# Patient Record
Sex: Male | Born: 1945 | Race: White | Hispanic: No | Marital: Married | Smoking: Former smoker
Health system: Southern US, Community
[De-identification: ages and names within clinical notes are randomized; demographics above are authoritative.]

## PROBLEM LIST (undated history)

## (undated) DIAGNOSIS — M1990 Unspecified osteoarthritis, unspecified site: Secondary | ICD-10-CM

## (undated) DIAGNOSIS — L570 Actinic keratosis: Secondary | ICD-10-CM

## (undated) DIAGNOSIS — F32A Depression, unspecified: Secondary | ICD-10-CM

## (undated) DIAGNOSIS — I2510 Atherosclerotic heart disease of native coronary artery without angina pectoris: Secondary | ICD-10-CM

## (undated) DIAGNOSIS — G4730 Sleep apnea, unspecified: Secondary | ICD-10-CM

## (undated) DIAGNOSIS — M5136 Other intervertebral disc degeneration, lumbar region: Secondary | ICD-10-CM

## (undated) DIAGNOSIS — G4761 Periodic limb movement disorder: Secondary | ICD-10-CM

## (undated) DIAGNOSIS — F329 Major depressive disorder, single episode, unspecified: Secondary | ICD-10-CM

## (undated) DIAGNOSIS — M51369 Other intervertebral disc degeneration, lumbar region without mention of lumbar back pain or lower extremity pain: Secondary | ICD-10-CM

## (undated) DIAGNOSIS — I639 Cerebral infarction, unspecified: Secondary | ICD-10-CM

## (undated) DIAGNOSIS — Z951 Presence of aortocoronary bypass graft: Secondary | ICD-10-CM

## (undated) DIAGNOSIS — K219 Gastro-esophageal reflux disease without esophagitis: Secondary | ICD-10-CM

## (undated) DIAGNOSIS — E119 Type 2 diabetes mellitus without complications: Secondary | ICD-10-CM

## (undated) DIAGNOSIS — E079 Disorder of thyroid, unspecified: Secondary | ICD-10-CM

## (undated) DIAGNOSIS — M109 Gout, unspecified: Secondary | ICD-10-CM

## (undated) DIAGNOSIS — I10 Essential (primary) hypertension: Secondary | ICD-10-CM

## (undated) DIAGNOSIS — E7800 Pure hypercholesterolemia, unspecified: Secondary | ICD-10-CM

## (undated) HISTORY — PX: SHOULDER SURGERY: SHX246

## (undated) HISTORY — PX: CORONARY ARTERY BYPASS GRAFT: SHX141

## (undated) HISTORY — DX: Actinic keratosis: L57.0

## (undated) HISTORY — PX: CARPAL TUNNEL RELEASE: SHX101

## (undated) HISTORY — PX: EYE SURGERY: SHX253

## (undated) HISTORY — PX: BACK SURGERY: SHX140

---

## 2007-06-10 DIAGNOSIS — J309 Allergic rhinitis, unspecified: Secondary | ICD-10-CM | POA: Insufficient documentation

## 2011-05-11 DIAGNOSIS — Z951 Presence of aortocoronary bypass graft: Secondary | ICD-10-CM | POA: Insufficient documentation

## 2011-05-11 DIAGNOSIS — E8881 Metabolic syndrome: Secondary | ICD-10-CM | POA: Insufficient documentation

## 2013-08-28 DIAGNOSIS — H269 Unspecified cataract: Secondary | ICD-10-CM | POA: Insufficient documentation

## 2014-06-01 DIAGNOSIS — E1121 Type 2 diabetes mellitus with diabetic nephropathy: Secondary | ICD-10-CM | POA: Insufficient documentation

## 2015-12-07 ENCOUNTER — Encounter: Payer: Self-pay | Admitting: Emergency Medicine

## 2015-12-07 ENCOUNTER — Emergency Department
Admission: EM | Admit: 2015-12-07 | Discharge: 2015-12-07 | Disposition: A | Discharge status: Home / Self Care | Payer: Medicare Other | Attending: Emergency Medicine | Admitting: Emergency Medicine

## 2015-12-07 DIAGNOSIS — M5431 Sciatica, right side: Secondary | ICD-10-CM | POA: Diagnosis not present

## 2015-12-07 DIAGNOSIS — Y9301 Activity, walking, marching and hiking: Secondary | ICD-10-CM | POA: Insufficient documentation

## 2015-12-07 DIAGNOSIS — S39012A Strain of muscle, fascia and tendon of lower back, initial encounter: Secondary | ICD-10-CM | POA: Diagnosis not present

## 2015-12-07 DIAGNOSIS — Y92007 Garden or yard of unspecified non-institutional (private) residence as the place of occurrence of the external cause: Secondary | ICD-10-CM | POA: Diagnosis not present

## 2015-12-07 DIAGNOSIS — I10 Essential (primary) hypertension: Secondary | ICD-10-CM | POA: Diagnosis not present

## 2015-12-07 DIAGNOSIS — Z951 Presence of aortocoronary bypass graft: Secondary | ICD-10-CM | POA: Diagnosis not present

## 2015-12-07 DIAGNOSIS — X58XXXA Exposure to other specified factors, initial encounter: Secondary | ICD-10-CM | POA: Insufficient documentation

## 2015-12-07 DIAGNOSIS — E119 Type 2 diabetes mellitus without complications: Secondary | ICD-10-CM | POA: Insufficient documentation

## 2015-12-07 DIAGNOSIS — Z87891 Personal history of nicotine dependence: Secondary | ICD-10-CM | POA: Diagnosis not present

## 2015-12-07 DIAGNOSIS — Y999 Unspecified external cause status: Secondary | ICD-10-CM | POA: Insufficient documentation

## 2015-12-07 DIAGNOSIS — S3992XA Unspecified injury of lower back, initial encounter: Secondary | ICD-10-CM | POA: Diagnosis present

## 2015-12-07 HISTORY — DX: Type 2 diabetes mellitus without complications: E11.9

## 2015-12-07 HISTORY — DX: Disorder of thyroid, unspecified: E07.9

## 2015-12-07 HISTORY — DX: Pure hypercholesterolemia, unspecified: E78.00

## 2015-12-07 HISTORY — DX: Unspecified osteoarthritis, unspecified site: M19.90

## 2015-12-07 HISTORY — DX: Essential (primary) hypertension: I10

## 2015-12-07 MED ORDER — OXYCODONE-ACETAMINOPHEN 5-325 MG PO TABS: 1.0000 | ORAL_TABLET | Freq: Four times a day (QID) | ORAL | 0 refills | Status: AC | PRN

## 2015-12-07 MED ORDER — KETOROLAC TROMETHAMINE 30 MG/ML IJ SOLN
30.0000 mg | Freq: Once | INTRAMUSCULAR | Status: AC
  Administered 2015-12-07: 30 mg via INTRAMUSCULAR
  Filled 2015-12-07: qty 1

## 2015-12-07 MED ORDER — DIAZEPAM 5 MG PO TABS
5.0000 mg | ORAL_TABLET | Freq: Once | ORAL | Status: AC
  Administered 2015-12-07: 5 mg via ORAL
  Filled 2015-12-07: qty 1

## 2015-12-07 MED ORDER — DIAZEPAM 2 MG PO TABS: 2.0000 mg | ORAL_TABLET | Freq: Three times a day (TID) | ORAL | 0 refills | Status: AC | PRN

## 2015-12-07 MED ORDER — DIAZEPAM 5 MG/ML IJ SOLN
5.0000 mg | Freq: Once | INTRAMUSCULAR | Status: DC
Start: 2015-12-07 — End: 2015-12-07

## 2015-12-07 MED ORDER — OXYCODONE-ACETAMINOPHEN 5-325 MG PO TABS
1.0000 | ORAL_TABLET | Freq: Once | ORAL | Status: AC
  Administered 2015-12-07: 1 via ORAL
  Filled 2015-12-07: qty 1

## 2015-12-07 NOTE — ED Notes (Signed)
Pt states back pain began Saturday after doing a lot of yard work like blowing leaves. Hx of L4 and L5 surgery, last in February. Pt states pain worse with movement, hasn't been able to sleep. Pt has taken hydrocodone and motrin without relief. Pt wheeled to room, c/o R knee has buckled a few times d/t pain shooting down R leg to R knee.

## 2015-12-07 NOTE — ED Triage Notes (Signed)
Patient reports lower back pain that radiates down right leg. Patient has h/o surgery to d/t ruptured disc. Denies any fall/injury.

## 2015-12-07 NOTE — ED Provider Notes (Signed)
[facility] Emergency Department Provider Note  ____________________________________________  Time seen: Approximately 12:11 PM  I have reviewed the triage vital signs and the nursing notes.   HISTORY  Chief Complaint Back Pain    HPI Tim Miranda is a 70 y.o. male , NAD, presents to emergency department with four-day history of right lower back pain. Patient states he was working in his yard on Saturday and had onset of lower back pain. States over the course of the afternoon pain seemed to worsen especially when he would walk downstairs. Had an episode in which his right knee "gave out" but did not call his fall. Later in the evening he and his wife went out to dinner and states while walking in the restaurant his knee "gave out" again. But again denies any fall. States his lower back pain has begun to radiate down the leg into the knee over the last 2 days. Has a prescription for hydrocodone which was given him after his lumbar surgery in February 2017. Has run out of that medication. Denies any saddle paresthesias or loss of bowel or bladder control. Has not noted any rashes about skin. Denies any chest pain or shortness of breath. Has been able to ambulate but with increased pain with weightbearing about the right leg. Has had issues with lower back pain and sciatica in the past which has been similar. Has recently moved to the area over the last 4 weeks and has not been able to establish care with orthopedics or primary care.   Past Medical History:  Diagnosis Date  . Arthritis   . Diabetes mellitus without complication ([place])   . Hypercholesteremia   . Hypertension   . Thyroid disease     There are no active problems to display for this patient.   Past Surgical History:  Procedure Laterality Date  . BACK SURGERY    . CARPAL TUNNEL RELEASE    . CORONARY ARTERY BYPASS GRAFT    . EYE SURGERY    . SHOULDER SURGERY      Prior to Admission medications    Medication Sig Start Date End Date Taking? Authorizing Provider  diazepam (VALIUM) 2 MG tablet Take 1 tablet (2 mg total) by mouth every 8 (eight) hours as needed for muscle spasms. 12/07/15 12/14/15  Brigett Estell L Aeris Hersman, PA-C  oxyCODONE-acetaminophen (ROXICET) 5-325 MG tablet Take 1 tablet by mouth every 6 (six) hours as needed. 12/07/15 12/06/16  Dallys Nowakowski L Myley Bahner, PA-C    Allergies Review of patient's allergies indicates no known allergies.  History reviewed. No pertinent family history.  Social History Social History  Substance Use Topics  . Smoking status: Former Research scientist (life sciences)  . Smokeless tobacco: Not on file  . Alcohol use No     Review of Systems  Constitutional: No fatigue Cardiovascular: No chest pain. Respiratory:  No shortness of breath.  Musculoskeletal: Positive for back pain.  Skin: Negative for rash. Neurological: Negative for numbness, weakness, tingling. No saddle paresthesias nor loss of bowel or bladder control. 10-point ROS otherwise negative.  ____________________________________________   PHYSICAL EXAM:  VITAL SIGNS: ED Triage Vitals  Enc Vitals Group     BP 12/07/15 1126 (!) 154/72     Pulse Rate 12/07/15 1126 75     Resp 12/07/15 1126 18     Temp 12/07/15 1126 97.4 F (36.3 C)     Temp Source 12/07/15 1126 Oral     SpO2 12/07/15 1126 95 %     Weight 12/07/15  1123 233 lb (105.7 kg)     Height 12/07/15 1123 5\' 9"  (1.753 m)     Head Circumference --      Peak Flow --      Pain Score 12/07/15 1123 10     Pain Loc --      Pain Edu? --      Excl. in [place]? --      Constitutional: Alert and oriented. Well appearing and in no acute distress. Eyes: Conjunctivae are normal.  Head: Atraumatic. Cardiovascular: Normal rate, regular rhythm. Normal S1 and S2.  Good peripheral circulation with 2+ pulses noted in the right lower extremity. Respiratory: Normal respiratory effort without tachypnea or retractions. Lungs CTAB with breath sounds noted in all lung  fields. Musculoskeletal: Mild lower central lumbar tenderness to palpation without step-offs or masses. Tenderness to palpation about the right lateral musculoskeletal region of the lumbar spine. Positive right SI joint tenderness to deep palpation. No hip or pelvic pain to palpation. Positive right straight leg raise. Negative left straight leg raise. No lower extremity tenderness nor edema.  No joint effusions. Neurologic:  Normal speech and language. No gross focal neurologic deficits are appreciated.  Skin:  Skin is warm, dry and intact. No rash, redness, swelling, skin sores noted. Psychiatric: Mood and affect are normal. Speech and behavior are normal. Patient exhibits appropriate insight and judgement.   ____________________________________________   LABS  None ____________________________________________  EKG  None ____________________________________________  RADIOLOGY  None ____________________________________________    PROCEDURES  Procedure(s) performed: None   Procedures   Medications  oxyCODONE-acetaminophen (PERCOCET/ROXICET) 5-325 MG per tablet 1 tablet (1 tablet Oral Given 12/07/15 1230)  ketorolac (TORADOL) 30 MG/ML injection 30 mg (30 mg Intramuscular Given 12/07/15 1230)  diazepam (VALIUM) tablet 5 mg (5 mg Oral Given 12/07/15 1230)   Patient noted significant decrease in pain after being given Percocet, Toradol and Valium.  ____________________________________________   INITIAL IMPRESSION / ASSESSMENT AND PLAN / ED COURSE  Pertinent labs & imaging results that were available during my care of the patient were reviewed by me and considered in my medical decision making (see chart for details).  Clinical Course  Comment By Time  Patient notes his pain has significantly improved with Toradol, Percocet and diazepam. Patient continues to deny any saddle paresthesias nor loss of bowel or bladder control. He has been able to ambulate with less pain. He is  agreeable for discharge and follow-up with orthopedics. Braxton Feathers, PA-C 09/27 1328    Patient's diagnosis is consistent with sciatica of the right side due to low back strain. Patient will be discharged home with prescriptions for Roxicet and Valium to take as directed. Patient is to follow up with Dr. Roland Rack in orthopedics if symptoms persist past this treatment course. Patient also advised to establish care with [facility] for primary care services.Patient is given ED precautions to return to the ED for any worsening or new symptoms.    ____________________________________________  FINAL CLINICAL IMPRESSION(S) / ED DIAGNOSES  Final diagnoses:  Sciatica of right side  Low back strain, initial encounter      NEW MEDICATIONS STARTED DURING THIS VISIT:  Discharge Medication List as of 12/07/2015  1:31 PM    START taking these medications   Details  diazepam (VALIUM) 2 MG tablet Take 1 tablet (2 mg total) by mouth every 8 (eight) hours as needed for muscle spasms., Starting Wed 12/07/2015, Until Wed 12/14/2015, Print    oxyCODONE-acetaminophen (ROXICET) 5-325 MG tablet Take  1 tablet by mouth every 6 (six) hours as needed., Starting Wed 12/07/2015, Until Thu 12/06/2016, [place], PA-C 12/07/15 [place], MD 12/08/15 [phone]

## 2015-12-07 NOTE — ED Triage Notes (Signed)
Had back surgery feb 26. Reports back went out this weekend. Was blowing leaves all day Saturday and when was walking down stairs reports that is when it went out.

## 2015-12-20 ENCOUNTER — Other Ambulatory Visit: Payer: Self-pay | Admitting: Unknown Physician Specialty

## 2015-12-20 DIAGNOSIS — G8929 Other chronic pain: Secondary | ICD-10-CM

## 2015-12-20 DIAGNOSIS — M545 Low back pain, unspecified: Secondary | ICD-10-CM

## 2015-12-22 ENCOUNTER — Ambulatory Visit
Admission: RE | Admit: 2015-12-22 | Discharge: 2015-12-22 | Disposition: A | Discharge status: Home / Self Care | Payer: Medicare Other | Source: Ambulatory Visit | Attending: Unknown Physician Specialty | Admitting: Unknown Physician Specialty

## 2015-12-22 DIAGNOSIS — M48061 Spinal stenosis, lumbar region without neurogenic claudication: Secondary | ICD-10-CM | POA: Diagnosis not present

## 2015-12-22 DIAGNOSIS — M545 Low back pain, unspecified: Secondary | ICD-10-CM

## 2015-12-22 DIAGNOSIS — M5126 Other intervertebral disc displacement, lumbar region: Secondary | ICD-10-CM | POA: Insufficient documentation

## 2015-12-22 DIAGNOSIS — G8929 Other chronic pain: Secondary | ICD-10-CM

## 2015-12-22 DIAGNOSIS — M5440 Lumbago with sciatica, unspecified side: Secondary | ICD-10-CM | POA: Diagnosis not present

## 2015-12-22 MED ORDER — GADOBENATE DIMEGLUMINE 529 MG/ML IV SOLN
20.0000 mL | Freq: Once | INTRAVENOUS | Status: AC | PRN
  Administered 2015-12-22: 20 mL via INTRAVENOUS

## 2015-12-29 ENCOUNTER — Other Ambulatory Visit: Payer: Self-pay | Admitting: Neurological Surgery

## 2015-12-29 DIAGNOSIS — M5416 Radiculopathy, lumbar region: Secondary | ICD-10-CM

## 2016-01-02 ENCOUNTER — Ambulatory Visit
Admission: RE | Admit: 2016-01-02 | Discharge: 2016-01-02 | Disposition: A | Discharge status: Home / Self Care | Payer: Medicare Other | Source: Ambulatory Visit | Attending: Neurological Surgery | Admitting: Neurological Surgery

## 2016-01-02 DIAGNOSIS — M5116 Intervertebral disc disorders with radiculopathy, lumbar region: Secondary | ICD-10-CM | POA: Diagnosis not present

## 2016-01-02 DIAGNOSIS — M5416 Radiculopathy, lumbar region: Secondary | ICD-10-CM

## 2016-01-02 DIAGNOSIS — M48061 Spinal stenosis, lumbar region without neurogenic claudication: Secondary | ICD-10-CM | POA: Insufficient documentation

## 2016-02-06 DIAGNOSIS — K219 Gastro-esophageal reflux disease without esophagitis: Secondary | ICD-10-CM | POA: Insufficient documentation

## 2016-02-06 DIAGNOSIS — I2510 Atherosclerotic heart disease of native coronary artery without angina pectoris: Secondary | ICD-10-CM | POA: Insufficient documentation

## 2016-03-15 ENCOUNTER — Other Ambulatory Visit: Payer: Self-pay | Admitting: Nurse Practitioner

## 2016-03-15 ENCOUNTER — Ambulatory Visit
Admission: RE | Admit: 2016-03-15 | Discharge: 2016-03-15 | Disposition: A | Discharge status: Home / Self Care | Payer: Medicare Other | Source: Ambulatory Visit | Attending: Nurse Practitioner | Admitting: Nurse Practitioner

## 2016-03-15 DIAGNOSIS — R609 Edema, unspecified: Secondary | ICD-10-CM | POA: Diagnosis present

## 2016-07-08 ENCOUNTER — Encounter: Payer: Self-pay | Admitting: Emergency Medicine

## 2016-07-08 ENCOUNTER — Emergency Department: Payer: Medicare Other

## 2016-07-08 ENCOUNTER — Emergency Department
Admission: EM | Admit: 2016-07-08 | Discharge: 2016-07-08 | Disposition: A | Discharge status: Home / Self Care | Payer: Medicare Other | Attending: Emergency Medicine | Admitting: Emergency Medicine

## 2016-07-08 DIAGNOSIS — I10 Essential (primary) hypertension: Secondary | ICD-10-CM | POA: Insufficient documentation

## 2016-07-08 DIAGNOSIS — H5589 Other irregular eye movements: Secondary | ICD-10-CM | POA: Diagnosis not present

## 2016-07-08 DIAGNOSIS — Z87891 Personal history of nicotine dependence: Secondary | ICD-10-CM | POA: Diagnosis not present

## 2016-07-08 DIAGNOSIS — J40 Bronchitis, not specified as acute or chronic: Secondary | ICD-10-CM

## 2016-07-08 DIAGNOSIS — E119 Type 2 diabetes mellitus without complications: Secondary | ICD-10-CM | POA: Diagnosis not present

## 2016-07-08 DIAGNOSIS — R0602 Shortness of breath: Secondary | ICD-10-CM | POA: Diagnosis present

## 2016-07-08 LAB — CBC WITH DIFFERENTIAL/PLATELET
BASOS PCT: 1 %
Basophils Absolute: 0.1 10*3/uL (ref 0–0.1)
EOS ABS: 0.3 10*3/uL (ref 0–0.7)
EOS PCT: 3 %
HCT: 43.8 % (ref 40.0–52.0)
HEMOGLOBIN: 15.1 g/dL (ref 13.0–18.0)
Lymphocytes Relative: 27 %
Lymphs Abs: 2.4 10*3/uL (ref 1.0–3.6)
MCH: 31 pg (ref 26.0–34.0)
MCHC: 34.4 g/dL (ref 32.0–36.0)
MCV: 90.2 fL (ref 80.0–100.0)
MONO ABS: 1.3 10*3/uL — AB (ref 0.2–1.0)
MONOS PCT: 15 %
NEUTROS PCT: 54 %
Neutro Abs: 4.8 10*3/uL (ref 1.4–6.5)
Platelets: 277 10*3/uL (ref 150–440)
RBC: 4.86 MIL/uL (ref 4.40–5.90)
RDW: 14.4 % (ref 11.5–14.5)
WBC: 9 10*3/uL (ref 3.8–10.6)

## 2016-07-08 LAB — COMPREHENSIVE METABOLIC PANEL
ALBUMIN: 4.2 g/dL (ref 3.5–5.0)
ALK PHOS: 45 U/L (ref 38–126)
ALT: 22 U/L (ref 17–63)
ANION GAP: 9 (ref 5–15)
AST: 33 U/L (ref 15–41)
BILIRUBIN TOTAL: 0.7 mg/dL (ref 0.3–1.2)
BUN: 16 mg/dL (ref 6–20)
CALCIUM: 9.3 mg/dL (ref 8.9–10.3)
CO2: 24 mmol/L (ref 22–32)
CREATININE: 1.2 mg/dL (ref 0.61–1.24)
Chloride: 105 mmol/L (ref 101–111)
GFR calc Af Amer: >60 mL/min (ref >60)
GFR calc non Af Amer: 59 mL/min — ABNORMAL LOW (ref >60)
GLUCOSE: 166 mg/dL — AB (ref 65–99)
Potassium: 3.8 mmol/L (ref 3.5–5.1)
SODIUM: 138 mmol/L (ref 135–145)
TOTAL PROTEIN: 7.4 g/dL (ref 6.5–8.1)

## 2016-07-08 LAB — BRAIN NATRIURETIC PEPTIDE: B Natriuretic Peptide: 42 pg/mL (ref 0.0–100.0)

## 2016-07-08 LAB — TROPONIN I: Troponin I: <0.03 ng/mL (ref <0.03)

## 2016-07-08 MED ORDER — AZITHROMYCIN 250 MG PO TABS: ORAL_TABLET | ORAL | 0 refills | Status: AC

## 2016-07-08 MED ORDER — IPRATROPIUM-ALBUTEROL 0.5-2.5 (3) MG/3ML IN SOLN
3.0000 mL | Freq: Once | RESPIRATORY_TRACT | Status: AC
  Administered 2016-07-08: 3 mL via RESPIRATORY_TRACT

## 2016-07-08 MED ORDER — IPRATROPIUM-ALBUTEROL 0.5-2.5 (3) MG/3ML IN SOLN
3.0000 mL | Freq: Once | RESPIRATORY_TRACT | Status: AC
  Administered 2016-07-08: 3 mL via RESPIRATORY_TRACT
  Filled 2016-07-08: qty 3

## 2016-07-08 MED ORDER — PREDNISONE 20 MG PO TABS
60.0000 mg | ORAL_TABLET | Freq: Once | ORAL | Status: AC
  Administered 2016-07-08: 60 mg via ORAL

## 2016-07-08 MED ORDER — METHYLPREDNISOLONE SODIUM SUCC 125 MG IJ SOLR
125.0000 mg | Freq: Once | INTRAMUSCULAR | Status: AC
  Administered 2016-07-08: 125 mg via INTRAVENOUS
  Filled 2016-07-08: qty 2

## 2016-07-08 MED ORDER — ALBUTEROL SULFATE HFA 108 (90 BASE) MCG/ACT IN AERS: 2.0000 | INHALATION_SPRAY | Freq: Four times a day (QID) | RESPIRATORY_TRACT | 2 refills | Status: DC | PRN

## 2016-07-08 MED ORDER — PREDNISONE 20 MG PO TABS: 20.0000 mg | ORAL_TABLET | Freq: Every day | ORAL | 0 refills | Status: AC

## 2016-07-08 MED ORDER — AZITHROMYCIN 500 MG PO TABS
ORAL_TABLET | ORAL | Status: AC
  Filled 2016-07-08: qty 1

## 2016-07-08 MED ORDER — ALBUTEROL SULFATE (2.5 MG/3ML) 0.083% IN NEBU: 5.0000 mg | INHALATION_SOLUTION | Freq: Once | RESPIRATORY_TRACT | Status: DC

## 2016-07-08 MED ORDER — PREDNISONE 20 MG PO TABS
ORAL_TABLET | ORAL | Status: AC
  Filled 2016-07-08: qty 3

## 2016-07-08 MED ORDER — IPRATROPIUM-ALBUTEROL 0.5-2.5 (3) MG/3ML IN SOLN
RESPIRATORY_TRACT | Status: AC
  Administered 2016-07-08: 3 mL via RESPIRATORY_TRACT
  Filled 2016-07-08: qty 9

## 2016-07-08 MED ORDER — AZITHROMYCIN 500 MG PO TABS
500.0000 mg | ORAL_TABLET | Freq: Once | ORAL | Status: AC
  Administered 2016-07-08: 500 mg via ORAL

## 2016-07-08 NOTE — ED Provider Notes (Signed)
[facility] Emergency Department Provider Note   ____________________________________________   First MD Initiated Contact with Patient 07/08/16 1819     (approximate)  I have reviewed the triage vital signs and the nursing notes.   HISTORY  Chief Complaint Shortness of Breath    HPI Tim Miranda is a 71 y.o. male patient complain of shortness of breath and feeling tight and chest since Monday. He's been coughing up small amounts of phlegm not really having any color and it. He complains of left-sided chest pain when he breathes. He's been wheezing a lot. His cheeks have been flushed but he does not think he's  had a fever. It's getting worse slowly day by day. He reports he got a breathing treatment at [facility] urgent care on Thursday and passed out. His wife reports his eyes just rolled back in his head and he fell over backwards.   Past Medical History:  Diagnosis Date  . Arthritis   . Diabetes mellitus without complication ([place])   . Hypercholesteremia   . Hypertension   . Thyroid disease     There are no active problems to display for this patient.   Past Surgical History:  Procedure Laterality Date  . BACK SURGERY    . CARPAL TUNNEL RELEASE    . CORONARY ARTERY BYPASS GRAFT    . EYE SURGERY    . SHOULDER SURGERY      Prior to Admission medications   Medication Sig Start Date End Date Taking? Authorizing Provider  albuterol (PROVENTIL HFA;VENTOLIN HFA) 108 (90 Base) MCG/ACT inhaler Inhale 2 puffs into the lungs every 6 (six) hours as needed for wheezing or shortness of breath. 07/08/16   Nena Polio, MD  azithromycin (ZITHROMAX Z-PAK) 250 MG tablet Take 2 tablets (500 mg) on  Day 1,  followed by 1 tablet (250 mg) once daily on Days 2 through 5. 07/08/16 07/13/16  Nena Polio, MD  oxyCODONE-acetaminophen (ROXICET) 5-325 MG tablet Take 1 tablet by mouth every 6 (six) hours as needed. 12/07/15 12/06/16  Jami L Hagler, PA-C  predniSONE  (DELTASONE) 20 MG tablet Take 1 tablet (20 mg total) by mouth daily. 07/08/16 07/08/17  Nena Polio, MD    Allergies Metformin and related  History reviewed. No pertinent family history.  Social History Social History  Substance Use Topics  . Smoking status: Former Research scientist (life sciences)  . Smokeless tobacco: Not on file  . Alcohol use No    Review of Systems  Constitutional: No fever/chills Eyes: No visual changes. ENT: No sore throat. Cardiovascular: See history of present illness. Respiratory: See history of present illness Gastrointestinal: No abdominal pain.  No nausea, no vomiting.  No diarrhea.  No constipation. Genitourinary: Negative for dysuria. Musculoskeletal: Negative for back pain. Skin: Negative for rash. Neurological: Negative for headaches, focal weakness or numbness.   ____________________________________________   PHYSICAL EXAM:  VITAL SIGNS: ED Triage Vitals [07/08/16 1800]  Enc Vitals Group     BP (!) 149/85     Pulse Rate (!) 102     Resp (!) 24     Temp 98.8 F (37.1 C)     Temp Source Oral     SpO2 99 %     Weight 235 lb (106.6 kg)     Height 5\' 9"  (1.753 m)     Head Circumference      Peak Flow      Pain Score      Pain Loc  Pain Edu?      Excl. in [place]?    Constitutional: Alert and oriented. Well appearing and in no acute distress. Eyes: Conjunctivae are normal. PERRL. EOMI. Head: Atraumatic. Nose: No congestion/rhinnorhea. Mouth/Throat: Mucous membranes are moist.  Oropharynx non-erythematous. Neck: No stridor.  Cardiovascular: Normal rate, regular rhythm. Grossly normal heart sounds.  Good peripheral circulation. Respiratory: Normal respiratory effort.  No retractions. Lungs Diffuse wheezes Gastrointestinal: Soft and nontender. No distention. No abdominal bruits. No CVA tenderness. Musculoskeletal: No lower extremity tenderness nor edema.  No joint effusions. Neurologic:  Normal speech and language. No gross focal neurologic deficits are  appreciated. No gait instability. Skin:  Skin is warm, dry and intact. No rash noted. Psychiatric: Mood and affect are normal. Speech and behavior are normal.  ____________________________________________   LABS (all labs ordered are listed, but only abnormal results are displayed)  Labs Reviewed  COMPREHENSIVE METABOLIC PANEL - Abnormal; Notable for the following:       Result Value   Glucose, Bld 166 (*)    GFR calc non Af Amer 59 (*)    All other components within normal limits  CBC WITH DIFFERENTIAL/PLATELET - Abnormal; Notable for the following:    Monocytes Absolute 1.3 (*)    All other components within normal limits  TROPONIN I  BRAIN NATRIURETIC PEPTIDE  URINALYSIS, COMPLETE (UACMP) WITH MICROSCOPIC   ____________________________________________  EKG  EKG read and interpreted by me shows normal sinus rhythm rate of 84 normal axis right bundle branch block there is some ST segment depression in the inferior leads. Computer is reading a lateral ischemia but am wondering if that's not more inferior. There is a small amount of ST depression in V6 as well. ____________________________________________  RADIOLOGY Study Result   CLINICAL DATA:  Shortness of breath and chest tightness  EXAM: CHEST  2 VIEW  COMPARISON:  None.  FINDINGS: Cardiac shadow is within normal limits. Postsurgical changes are seen. The lungs are well aerated bilaterally without focal infiltrate. No acute bony abnormality is seen.  IMPRESSION: No active cardiopulmonary disease.   Electronically Signed   By: Inez Catalina M.D.   On: 07/08/2016 18:24    Study Result   CLINICAL DATA:  Shortness of breath and chest tightness  EXAM: CT HEAD WITHOUT CONTRAST  TECHNIQUE: Contiguous axial images were obtained from the base of the skull through the vertex without intravenous contrast.  COMPARISON:  None.  FINDINGS: Brain: No evidence of acute infarction, hemorrhage,  hydrocephalus, extra-axial collection or mass lesion/mass effect.  Vascular: Carotid artery calcifications.  No hyperdense vessels.  Skull: No fracture or suspicious bone lesion  Sinuses/Orbits: Mucosal thickening in the maxillary, ethmoid, and sphenoid sinuses. Underpneumatized frontal sinuses. No acute orbital abnormality.  Other: None  IMPRESSION: No CT evidence for acute intracranial abnormality.   Electronically Signed   By: Donavan Foil M.D.   On: 07/08/2016 20:00      ____________________________________________   PROCEDURES  Procedure(s) performed:   Procedures  Critical Care performed:   ____________________________________________   INITIAL IMPRESSION / ASSESSMENT AND PLAN / ED COURSE  Pertinent labs & imaging results that were available during my care of the patient were reviewed by me and considered in my medical decision making (see chart for details).  Patient's breathing improves markedly with nebulizer treatments. He is able to walk and maintain his sats at 9394% is not short of breath and was not wheezing anymore we'll discharge him.      ____________________________________________   FINAL CLINICAL IMPRESSION(S) /  ED DIAGNOSES  Final diagnoses:  Bronchitis      NEW MEDICATIONS STARTED DURING THIS VISIT:  New Prescriptions   ALBUTEROL (PROVENTIL HFA;VENTOLIN HFA) 108 (90 BASE) MCG/ACT INHALER    Inhale 2 puffs into the lungs every 6 (six) hours as needed for wheezing or shortness of breath.   AZITHROMYCIN (ZITHROMAX Z-PAK) 250 MG TABLET    Take 2 tablets (500 mg) on  Day 1,  followed by 1 tablet (250 mg) once daily on Days 2 through 5.   PREDNISONE (DELTASONE) 20 MG TABLET    Take 1 tablet (20 mg total) by mouth daily.     Note:  This document was prepared using Dragon voice recognition software and may include unintentional dictation errors.    Nena Polio, MD 07/08/16 2128

## 2016-07-08 NOTE — ED Notes (Signed)
Patient on antibiotics for URI Thursday-today, worsening symptoms.

## 2016-07-08 NOTE — Discharge Instructions (Signed)
Take the prednisone one a day starting tomorrow evening to the gone. Use the Zithromax as directed. Use the inhaler 2 puffs 4 times a day at least with a spacer. He can use it up to every 4 hours. If you need to use it more than every 4 hours he needs to come and be rechecked. Please follow-up with your doctor in the next day or 2 and return if worse.

## 2016-07-08 NOTE — ED Notes (Signed)
Patient taken to CT scan.

## 2016-07-08 NOTE — ED Triage Notes (Signed)
Pt presents to ED with c/o [facility] and chest tightness since Monday. Pt states increasingly worse since Thursday at this time. Pt states went to [facility] on Thursday and pt was given a breathing treatment and patient passed out. Pt is unsure what medication he was given.

## 2016-07-10 ENCOUNTER — Other Ambulatory Visit
Admission: RE | Admit: 2016-07-10 | Discharge: 2016-07-10 | Disposition: A | Discharge status: Home / Self Care | Payer: Medicare Other | Source: Ambulatory Visit | Attending: Student | Admitting: Student

## 2016-07-10 DIAGNOSIS — K529 Noninfective gastroenteritis and colitis, unspecified: Secondary | ICD-10-CM | POA: Diagnosis present

## 2016-07-10 LAB — GASTROINTESTINAL PANEL BY PCR, STOOL (REPLACES STOOL CULTURE)
Adenovirus F40/41: NOT DETECTED
Astrovirus: NOT DETECTED
CAMPYLOBACTER SPECIES: NOT DETECTED
CRYPTOSPORIDIUM: NOT DETECTED
CYCLOSPORA CAYETANENSIS: NOT DETECTED
Entamoeba histolytica: NOT DETECTED
Enteroaggregative E coli (EAEC): NOT DETECTED
Enteropathogenic E coli (EPEC): NOT DETECTED
Enterotoxigenic E coli (ETEC): NOT DETECTED
GIARDIA LAMBLIA: NOT DETECTED
Norovirus GI/GII: NOT DETECTED
PLESIMONAS SHIGELLOIDES: NOT DETECTED
ROTAVIRUS A: NOT DETECTED
SHIGELLA/ENTEROINVASIVE E COLI (EIEC): NOT DETECTED
Salmonella species: NOT DETECTED
Sapovirus (I, II, IV, and V): NOT DETECTED
Shiga like toxin producing E coli (STEC): NOT DETECTED
VIBRIO SPECIES: NOT DETECTED
Vibrio cholerae: NOT DETECTED
YERSINIA ENTEROCOLITICA: NOT DETECTED

## 2016-07-10 LAB — C DIFFICILE QUICK SCREEN W PCR REFLEX
C DIFFICILE (CDIFF) TOXIN: NEGATIVE
C Diff antigen: POSITIVE — AB

## 2016-07-10 LAB — CLOSTRIDIUM DIFFICILE BY PCR: Toxigenic C. Difficile by PCR: POSITIVE — AB

## 2016-07-19 LAB — PANCREATIC ELASTASE, FECAL: Pancreatic Elastase-1, Stool: >500 ug Elast./g (ref >200)

## 2016-08-20 ENCOUNTER — Other Ambulatory Visit
Admission: RE | Admit: 2016-08-20 | Discharge: 2016-08-20 | Disposition: A | Discharge status: Home / Self Care | Payer: Medicare Other | Source: Ambulatory Visit | Attending: Student | Admitting: Student

## 2016-08-20 DIAGNOSIS — B9689 Other specified bacterial agents as the cause of diseases classified elsewhere: Secondary | ICD-10-CM | POA: Diagnosis present

## 2016-08-21 LAB — MISC LABCORP TEST (SEND OUT): LABCORP TEST CODE: 86207

## 2016-09-18 ENCOUNTER — Encounter: Payer: Self-pay | Admitting: *Deleted

## 2016-09-19 ENCOUNTER — Ambulatory Visit
Admission: RE | Admit: 2016-09-19 | Discharge: 2016-09-19 | Disposition: A | Discharge status: Home / Self Care | Payer: Medicare Other | Source: Ambulatory Visit | Attending: Unknown Physician Specialty | Admitting: Unknown Physician Specialty

## 2016-09-19 ENCOUNTER — Encounter
Admission: RE | Disposition: A | Discharge status: Home / Self Care | Payer: Self-pay | Source: Ambulatory Visit | Attending: Unknown Physician Specialty

## 2016-09-19 ENCOUNTER — Ambulatory Visit: Payer: Medicare Other | Admitting: Anesthesiology

## 2016-09-19 DIAGNOSIS — K219 Gastro-esophageal reflux disease without esophagitis: Secondary | ICD-10-CM | POA: Diagnosis not present

## 2016-09-19 DIAGNOSIS — D122 Benign neoplasm of ascending colon: Secondary | ICD-10-CM | POA: Insufficient documentation

## 2016-09-19 DIAGNOSIS — K5730 Diverticulosis of large intestine without perforation or abscess without bleeding: Secondary | ICD-10-CM | POA: Insufficient documentation

## 2016-09-19 DIAGNOSIS — K222 Esophageal obstruction: Secondary | ICD-10-CM | POA: Insufficient documentation

## 2016-09-19 DIAGNOSIS — Z79899 Other long term (current) drug therapy: Secondary | ICD-10-CM | POA: Diagnosis not present

## 2016-09-19 DIAGNOSIS — Z87891 Personal history of nicotine dependence: Secondary | ICD-10-CM | POA: Insufficient documentation

## 2016-09-19 DIAGNOSIS — I2510 Atherosclerotic heart disease of native coronary artery without angina pectoris: Secondary | ICD-10-CM | POA: Diagnosis not present

## 2016-09-19 DIAGNOSIS — K449 Diaphragmatic hernia without obstruction or gangrene: Secondary | ICD-10-CM | POA: Insufficient documentation

## 2016-09-19 DIAGNOSIS — R197 Diarrhea, unspecified: Secondary | ICD-10-CM | POA: Diagnosis present

## 2016-09-19 DIAGNOSIS — D120 Benign neoplasm of cecum: Secondary | ICD-10-CM | POA: Insufficient documentation

## 2016-09-19 DIAGNOSIS — Z7982 Long term (current) use of aspirin: Secondary | ICD-10-CM | POA: Diagnosis not present

## 2016-09-19 DIAGNOSIS — K529 Noninfective gastroenteritis and colitis, unspecified: Secondary | ICD-10-CM | POA: Diagnosis not present

## 2016-09-19 DIAGNOSIS — E7800 Pure hypercholesterolemia, unspecified: Secondary | ICD-10-CM | POA: Insufficient documentation

## 2016-09-19 DIAGNOSIS — I10 Essential (primary) hypertension: Secondary | ICD-10-CM | POA: Diagnosis not present

## 2016-09-19 DIAGNOSIS — M1990 Unspecified osteoarthritis, unspecified site: Secondary | ICD-10-CM | POA: Insufficient documentation

## 2016-09-19 DIAGNOSIS — E119 Type 2 diabetes mellitus without complications: Secondary | ICD-10-CM | POA: Insufficient documentation

## 2016-09-19 DIAGNOSIS — K3189 Other diseases of stomach and duodenum: Secondary | ICD-10-CM | POA: Insufficient documentation

## 2016-09-19 DIAGNOSIS — K2950 Unspecified chronic gastritis without bleeding: Secondary | ICD-10-CM | POA: Insufficient documentation

## 2016-09-19 DIAGNOSIS — K621 Rectal polyp: Secondary | ICD-10-CM | POA: Insufficient documentation

## 2016-09-19 DIAGNOSIS — M5136 Other intervertebral disc degeneration, lumbar region: Secondary | ICD-10-CM | POA: Diagnosis not present

## 2016-09-19 DIAGNOSIS — G4730 Sleep apnea, unspecified: Secondary | ICD-10-CM | POA: Diagnosis not present

## 2016-09-19 DIAGNOSIS — M109 Gout, unspecified: Secondary | ICD-10-CM | POA: Insufficient documentation

## 2016-09-19 DIAGNOSIS — Z951 Presence of aortocoronary bypass graft: Secondary | ICD-10-CM | POA: Diagnosis not present

## 2016-09-19 DIAGNOSIS — G4761 Periodic limb movement disorder: Secondary | ICD-10-CM | POA: Insufficient documentation

## 2016-09-19 DIAGNOSIS — E079 Disorder of thyroid, unspecified: Secondary | ICD-10-CM | POA: Diagnosis not present

## 2016-09-19 DIAGNOSIS — K640 First degree hemorrhoids: Secondary | ICD-10-CM | POA: Insufficient documentation

## 2016-09-19 DIAGNOSIS — Z7984 Long term (current) use of oral hypoglycemic drugs: Secondary | ICD-10-CM | POA: Insufficient documentation

## 2016-09-19 DIAGNOSIS — R1310 Dysphagia, unspecified: Secondary | ICD-10-CM | POA: Diagnosis not present

## 2016-09-19 DIAGNOSIS — D125 Benign neoplasm of sigmoid colon: Secondary | ICD-10-CM | POA: Insufficient documentation

## 2016-09-19 DIAGNOSIS — F329 Major depressive disorder, single episode, unspecified: Secondary | ICD-10-CM | POA: Insufficient documentation

## 2016-09-19 HISTORY — DX: Gastro-esophageal reflux disease without esophagitis: K21.9

## 2016-09-19 HISTORY — DX: Other intervertebral disc degeneration, lumbar region without mention of lumbar back pain or lower extremity pain: M51.369

## 2016-09-19 HISTORY — DX: Major depressive disorder, single episode, unspecified: F32.9

## 2016-09-19 HISTORY — DX: Sleep apnea, unspecified: G47.30

## 2016-09-19 HISTORY — PX: COLONOSCOPY WITH PROPOFOL: SHX5780

## 2016-09-19 HISTORY — DX: Other intervertebral disc degeneration, lumbar region: M51.36

## 2016-09-19 HISTORY — DX: Depression, unspecified: F32.A

## 2016-09-19 HISTORY — DX: Presence of aortocoronary bypass graft: Z95.1

## 2016-09-19 HISTORY — DX: Gout, unspecified: M10.9

## 2016-09-19 HISTORY — DX: Periodic limb movement disorder: G47.61

## 2016-09-19 HISTORY — DX: Atherosclerotic heart disease of native coronary artery without angina pectoris: I25.10

## 2016-09-19 HISTORY — PX: ESOPHAGOGASTRODUODENOSCOPY (EGD) WITH PROPOFOL: SHX5813

## 2016-09-19 LAB — GLUCOSE, CAPILLARY: Glucose-Capillary: 134 mg/dL — ABNORMAL HIGH (ref 65–99)

## 2016-09-19 SURGERY — COLONOSCOPY WITH PROPOFOL
Anesthesia: General

## 2016-09-19 MED ORDER — LIDOCAINE 2% (20 MG/ML) 5 ML SYRINGE
INTRAMUSCULAR | Status: DC | PRN
  Administered 2016-09-19: 40 mg via INTRAVENOUS

## 2016-09-19 MED ORDER — PROPOFOL 500 MG/50ML IV EMUL
INTRAVENOUS | Status: DC | PRN
  Administered 2016-09-19: 150 ug/kg/min via INTRAVENOUS

## 2016-09-19 MED ORDER — FENTANYL CITRATE (PF) 100 MCG/2ML IJ SOLN
INTRAMUSCULAR | Status: AC
  Filled 2016-09-19: qty 2

## 2016-09-19 MED ORDER — IPRATROPIUM-ALBUTEROL 0.5-2.5 (3) MG/3ML IN SOLN
3.0000 mL | Freq: Once | RESPIRATORY_TRACT | Status: DC
Start: 2016-09-19 — End: 2016-09-19

## 2016-09-19 MED ORDER — GLYCOPYRROLATE 0.2 MG/ML IJ SOLN
INTRAMUSCULAR | Status: AC
  Filled 2016-09-19: qty 1

## 2016-09-19 MED ORDER — MIDAZOLAM HCL 5 MG/5ML IJ SOLN
INTRAMUSCULAR | Status: DC | PRN
  Administered 2016-09-19: 1 mg via INTRAVENOUS

## 2016-09-19 MED ORDER — PHENYLEPHRINE HCL 10 MG/ML IJ SOLN
INTRAMUSCULAR | Status: DC | PRN
  Administered 2016-09-19 (×4): 100 ug via INTRAVENOUS

## 2016-09-19 MED ORDER — SODIUM CHLORIDE 0.9 % IV SOLN
INTRAVENOUS | Status: DC
  Administered 2016-09-19: 10:00:00 via INTRAVENOUS

## 2016-09-19 MED ORDER — GLYCOPYRROLATE 0.2 MG/ML IJ SOLN
INTRAMUSCULAR | Status: DC | PRN
  Administered 2016-09-19: 0.2 mg via INTRAVENOUS

## 2016-09-19 MED ORDER — MIDAZOLAM HCL 2 MG/2ML IJ SOLN
INTRAMUSCULAR | Status: AC
  Filled 2016-09-19: qty 2

## 2016-09-19 MED ORDER — PROPOFOL 500 MG/50ML IV EMUL
INTRAVENOUS | Status: AC
  Filled 2016-09-19: qty 50

## 2016-09-19 MED ORDER — EPHEDRINE SULFATE 50 MG/ML IJ SOLN
INTRAMUSCULAR | Status: DC | PRN
  Administered 2016-09-19: 5 mg via INTRAVENOUS

## 2016-09-19 MED ORDER — PROPOFOL 10 MG/ML IV BOLUS
INTRAVENOUS | Status: DC | PRN
Start: 2016-09-19 — End: 2016-09-19
  Administered 2016-09-19: 100 mg via INTRAVENOUS

## 2016-09-19 NOTE — Op Note (Signed)
[facility] Gastroenterology Patient Name: Tim Miranda Procedure Date: 09/19/2016 9:50 AM MRN: [id number] Account #: [id number] Date of Birth: 1945/10/17 Admit Type: Outpatient Age: 71 Room: [facility] ENDO ROOM 3 Gender: Male Note Status: Finalized Procedure:            Upper GI endoscopy Indications:          Dysphagia Providers:            Manya Silvas, MD Referring MD:         Shirline Frees MD, MD (Referring MD) Medicines:            Propofol per Anesthesia Complications:        No immediate complications. Procedure:            Pre-Anesthesia Assessment:                       - After reviewing the risks and benefits, the patient                        was deemed in satisfactory condition to undergo the                        procedure.                       After obtaining informed consent, the endoscope was                        passed under direct vision. Throughout the procedure,                        the patient's blood pressure, pulse, and oxygen                        saturations were monitored continuously. The Endoscope                        was introduced through the mouth, and advanced to the                        second part of duodenum. The upper GI endoscopy was                        accomplished without difficulty. The patient tolerated                        the procedure well. Findings:      A mild Schatzki ring (acquired) was found at the gastroesophageal       junction. At the end of the procedure A guidewire was placed and the       scope was withdrawn. Dilation was performed with a Savary dilator with       mild resistance at 17 mm.      Localized mildly erythematous mucosa without bleeding was found in the       gastric antrum. Biopsies were taken with a cold forceps for histology.      Localized mildly erythematous mucosa without active bleeding and with no       stigmata of bleeding was found in the duodenal bulb. Biopsies were  taken       with a cold forceps for  histology.      The exam was otherwise without abnormality.      A small hiatal hernia was present. Impression:           - Mild Schatzki ring. Dilated.                       - Erythematous mucosa in the antrum. Biopsied.                       - Erythematous duodenopathy. Biopsied.                       - The examination was otherwise normal. Recommendation:       - Await pathology results. Manya Silvas, MD 09/19/2016 10:10:10 AM This report has been signed electronically. Number of Addenda: 0 Note Initiated On: 09/19/2016 9:50 AM      [facility]

## 2016-09-19 NOTE — Anesthesia Preprocedure Evaluation (Signed)
Anesthesia Evaluation  Patient identified by MRN, date of birth, ID band Patient awake    Reviewed: Allergy & Precautions, H&P , NPO status , Patient's Chart, lab work & pertinent test results  History of Anesthesia Complications Negative for: history of anesthetic complications  Airway Mallampati: III  TM Distance: <3 FB Neck ROM: limited    Dental  (+) Poor Dentition, Chipped, Missing, Implants   Pulmonary neg shortness of breath, sleep apnea , former smoker,           Cardiovascular Exercise Tolerance: Good hypertension, (-) angina+ CAD and + CABG  (-) Past MI and (-) DOE      Neuro/Psych PSYCHIATRIC DISORDERS negative neurological ROS     GI/Hepatic Neg liver ROS, GERD  Controlled,  Endo/Other  diabetes, Type 2  Renal/GU negative Renal ROS  negative genitourinary   Musculoskeletal  (+) Arthritis ,   Abdominal   Peds  Hematology negative hematology ROS (+)   Anesthesia Other Findings Past Medical History: No date: Arthritis No date: Coronary artery disease No date: Degenerative disc disease, lumbar No date: Depression No date: Diabetes mellitus without complication (HCC) No date: GERD (gastroesophageal reflux disease) No date: Gout No date: Hx of CABG No date: Hypercholesteremia No date: Hypertension No date: PLMD (periodic limb movement disorder) No date: Sleep apnea No date: Thyroid disease  Past Surgical History: No date: BACK SURGERY No date: CARPAL TUNNEL RELEASE No date: CORONARY ARTERY BYPASS GRAFT No date: EYE SURGERY No date: SHOULDER SURGERY  BMI    Body Mass Index:  33.97 kg/m      Reproductive/Obstetrics negative OB ROS                             Anesthesia Physical Anesthesia Plan  ASA: III  Anesthesia Plan: General   Post-op Pain Management:    Induction: Intravenous  PONV Risk Score and Plan:   Airway Management Planned: Natural Airway and  Nasal Cannula  Additional Equipment:   Intra-op Plan:   Post-operative Plan:   Informed Consent: I have reviewed the patients History and Physical, chart, labs and discussed the procedure including the risks, benefits and alternatives for the proposed anesthesia with the patient or authorized representative who has indicated his/her understanding and acceptance.   Dental Advisory Given  Plan Discussed with: Anesthesiologist, CRNA and Surgeon  Anesthesia Plan Comments: (Patient consented for risks of anesthesia including but not limited to:  - adverse reactions to medications - damage to teeth, lips or other oral mucosa - sore throat or hoarseness - Damage to heart, brain, lungs or loss of life  Patient voiced understanding.)        Anesthesia Quick Evaluation

## 2016-09-19 NOTE — H&P (Signed)
Primary Care Physician:  Sherrin Daisy, MD Primary Gastroenterologist:  Dr. Vira Agar  Pre-Procedure History & Physical: HPI:  Tim Miranda is a 71 y.o. male is here for an endoscopy and colonoscopy.   Past Medical History:  Diagnosis Date  . Arthritis   . Coronary artery disease   . Degenerative disc disease, lumbar   . Depression   . Diabetes mellitus without complication ([place])   . GERD (gastroesophageal reflux disease)   . Gout   . Hx of CABG   . Hypercholesteremia   . Hypertension   . PLMD (periodic limb movement disorder)   . Sleep apnea   . Thyroid disease     Past Surgical History:  Procedure Laterality Date  . BACK SURGERY    . CARPAL TUNNEL RELEASE    . CORONARY ARTERY BYPASS GRAFT    . EYE SURGERY    . SHOULDER SURGERY      Prior to Admission medications   Medication Sig Start Date End Date Taking? Authorizing Provider  allopurinol (ZYLOPRIM) 300 MG tablet Take 300 mg by mouth daily.   Yes [provider]  aspirin EC 81 MG tablet Take 81 mg by mouth daily.   Yes [provider]  carvedilol (COREG) 25 MG tablet Take 12.5 mg by mouth 2 (two) times daily with a meal.   Yes [provider]  Cholecalciferol (VITAMIN D3) 2000 units TABS Take 2,000 Units by mouth 2 (two) times daily.   Yes [provider]  Coenzyme Q10 15 MG CAPS Take 10 mg by mouth daily.   Yes [provider]  fenofibrate 160 MG tablet Take 160 mg by mouth daily.   Yes [provider]  fluticasone (FLONASE) 50 MCG/ACT nasal spray Place 2 sprays into both nostrils daily.   Yes [provider]  glipiZIDE (GLUCOTROL XL) 2.5 MG 24 hr tablet Take 5 mg by mouth daily with breakfast.   Yes [provider]  Krill Oil 500 MG CAPS Take 500 mg by mouth daily.   Yes [provider]  levothyroxine (SYNTHROID, LEVOTHROID) 50 MCG tablet Take 50 mcg by mouth daily before breakfast.   Yes [provider]  losartan (COZAAR)  25 MG tablet Take 25 mg by mouth daily.   Yes [provider]  montelukast (SINGULAIR) 10 MG tablet Take 10 mg by mouth at bedtime.   Yes [provider]  oxyCODONE-acetaminophen (ROXICET) 5-325 MG tablet Take 1 tablet by mouth every 6 (six) hours as needed. 12/07/15 12/06/16 Yes Hagler, Jami L, PA-C  albuterol (PROVENTIL HFA;VENTOLIN HFA) 108 (90 Base) MCG/ACT inhaler Inhale 2 puffs into the lungs every 6 (six) hours as needed for wheezing or shortness of breath. 07/08/16   Nena Polio, MD  pantoprazole (PROTONIX) 20 MG tablet Take 20 mg by mouth 2 (two) times daily before a meal.    [provider]  pravastatin (PRAVACHOL) 80 MG tablet Take 80 mg by mouth daily.    [provider]  predniSONE (DELTASONE) 20 MG tablet Take 1 tablet (20 mg total) by mouth daily. Patient not taking: Reported on 09/18/2016 07/08/16 07/08/17  Nena Polio, MD    Allergies as of 07/04/2016  . (No Known Allergies)    History reviewed. No pertinent family history.  Social History   Social History  . Marital status: Married    Spouse name: N/A  . Number of children: N/A  . Years of education: N/A   Occupational History  . Not  on file.   Social History Main Topics  . Smoking status: Former Research scientist (life sciences)  . Smokeless tobacco: Never Used  . Alcohol use No  . Drug use: No  . Sexual activity: Not on file   Other Topics Concern  . Not on file   Social History Narrative  . No narrative on file    Review of Systems: See HPI, otherwise negative ROS  Physical Exam: BP 137/73   Pulse (!) 59   Temp 98 F (36.7 C) (Tympanic)   Resp 18   Ht 5\' 9"  (1.753 m)   Wt 104.3 kg (230 lb)   SpO2 97%   BMI 33.97 kg/m  General:   Alert,  pleasant and cooperative in NAD Head:  Normocephalic and atraumatic. Neck:  Supple; no masses or thyromegaly. Lungs:  Clear throughout to auscultation.    Heart:  Regular rate and rhythm. Abdomen:  Soft, nontender and nondistended. Normal  bowel sounds, without guarding, and without rebound.   Neurologic:  Alert and  oriented x4;  grossly normal neurologically.  Impression/Plan: Tim Miranda is here for an endoscopy and colonoscopy to be performed for dysphagia and chronic diarrhea.  Risks, benefits, limitations, and alternatives regarding  endoscopy and colonoscopy have been reviewed with the patient.  Questions have been answered.  All parties agreeable.   Gaylyn Cheers, MD  09/19/2016, 9:35 AM

## 2016-09-19 NOTE — Op Note (Signed)
[facility] Gastroenterology Patient Name: Quinlan Vollmer Procedure Date: 09/19/2016 9:49 AM MRN: [id number] Account #: [id number] Date of Birth: [birth year] Admit Type: Outpatient Age: 71 Room: [facility] ENDO ROOM 3 Gender: Male Note Status: Finalized Procedure:            Colonoscopy Indications:          Clinically significant diarrhea of unexplained origin Providers:            Manya Silvas, MD Referring MD:         Shirline Frees MD, MD (Referring MD) Medicines:            Propofol per Anesthesia Complications:        No immediate complications. Procedure:            Pre-Anesthesia Assessment:                       - After reviewing the risks and benefits, the patient                        was deemed in satisfactory condition to undergo the                        procedure.                       After obtaining informed consent, the colonoscope was                        passed under direct vision. Throughout the procedure,                        the patient's blood pressure, pulse, and oxygen                        saturations were monitored continuously. The                        Colonoscope was introduced through the anus and                        advanced to the the cecum, identified by appendiceal                        orifice and ileocecal valve. The colonoscopy was                        performed without difficulty. The patient tolerated the                        procedure well. The quality of the bowel preparation                        was good. Findings:      A diminutive polyp was found in the ascending colon. The polyp was       sessile. The polyp was removed with a jumbo cold forceps. Resection and       retrieval were complete.      A diminutive polyp was found in the cecum. The polyp was sessile. The       polyp was removed with a jumbo cold forceps. Resection and retrieval  were complete.      A diminutive polyp was found in the  ascending colon. The polyp was       sessile. The polyp was removed with a jumbo cold forceps. Resection and       retrieval were complete.      A diminutive polyp was found in the sigmoid colon. The polyp was       sessile. The polyp was removed with a jumbo cold forceps. Resection and       retrieval were complete.      A diminutive polyp was found in the rectum. The polyp was sessile. The       polyp was removed with a jumbo cold forceps. Resection and retrieval       were complete.      A diminutive polyp was found in the transverse colon. The polyp was       sessile. The polyp was removed with a jumbo cold forceps. Resection and       retrieval were complete.      A few small-mouthed diverticula were found in the sigmoid colon,       descending colon and transverse colon.      Internal hemorrhoids were found during endoscopy. The hemorrhoids were       small and Grade I (internal hemorrhoids that do not prolapse).      Biopsies done from ascending, transverse, descending and sigmoid colon       due to chronic diarrhea. Impression:           - One diminutive polyp in the ascending colon, removed                        with a jumbo cold forceps. Resected and retrieved.                       - One diminutive polyp in the cecum, removed with a                        jumbo cold forceps. Resected and retrieved.                       - One diminutive polyp in the ascending colon, removed                        with a jumbo cold forceps. Resected and retrieved.                       - One diminutive polyp in the sigmoid colon, removed                        with a jumbo cold forceps. Resected and retrieved.                       - One diminutive polyp in the rectum, removed with a                        jumbo cold forceps. Resected and retrieved.                       - One diminutive polyp in the transverse colon, removed  with a jumbo cold forceps. Resected and  retrieved.                       - Diverticulosis in the sigmoid colon, in the                        descending colon and in the transverse colon.                       - Internal hemorrhoids. Recommendation:       - Await pathology results. Manya Silvas, MD 09/19/2016 10:41:43 AM This report has been signed electronically. Number of Addenda: 0 Note Initiated On: 09/19/2016 9:49 AM Scope Withdrawal Time: 0 hours 11 minutes 27 seconds  Total Procedure Duration: 0 hours 16 minutes 16 seconds       [facility]

## 2016-09-19 NOTE — Anesthesia Postprocedure Evaluation (Signed)
Anesthesia Post Note  Patient: Tim Miranda  Procedure(s) Performed: Procedure(s) (LRB): COLONOSCOPY WITH PROPOFOL (N/A) ESOPHAGOGASTRODUODENOSCOPY (EGD) WITH PROPOFOL (N/A)  Patient location during evaluation: Endoscopy Anesthesia Type: General Level of consciousness: awake and alert Pain management: pain level controlled Vital Signs Assessment: post-procedure vital signs reviewed and stable Respiratory status: spontaneous breathing, nonlabored ventilation, respiratory function stable and patient connected to nasal cannula oxygen Cardiovascular status: blood pressure returned to baseline and stable Postop Assessment: no signs of nausea or vomiting Anesthetic complications: no     Last Vitals:  Vitals:   09/19/16 1055 09/19/16 1105  BP: 107/64 116/65  Pulse: 65 66  Resp: 20 20  Temp:      Last Pain:  Vitals:   09/19/16 1045  TempSrc: Axillary                 Precious Haws Piscitello

## 2016-09-19 NOTE — Anesthesia Post-op Follow-up Note (Cosign Needed)
Anesthesia QCDR form completed.        

## 2016-09-19 NOTE — Transfer of Care (Signed)
Immediate Anesthesia Transfer of Care Note  Patient: Tim Miranda  Procedure(s) Performed: Procedure(s): COLONOSCOPY WITH PROPOFOL (N/A) ESOPHAGOGASTRODUODENOSCOPY (EGD) WITH PROPOFOL (N/A)  Patient Location: PACU and Endoscopy Unit  Anesthesia Type:General  Level of Consciousness: sedated  Airway & Oxygen Therapy: Patient Spontanous Breathing and Patient connected to nasal cannula oxygen  Post-op Assessment: Report given to RN and Post -op Vital signs reviewed and stable  Post vital signs: Reviewed and stable  Last Vitals:  Vitals:   09/19/16 0928  BP: 137/73  Pulse: (!) 59  Resp: 18  Temp: 36.7 C    Last Pain:  Vitals:   09/19/16 0928  TempSrc: Tympanic         Complications: No apparent anesthesia complications

## 2016-09-20 ENCOUNTER — Encounter: Payer: Self-pay | Admitting: Unknown Physician Specialty

## 2016-09-20 LAB — SURGICAL PATHOLOGY

## 2017-06-04 ENCOUNTER — Encounter: Payer: Self-pay | Admitting: Emergency Medicine

## 2017-06-04 ENCOUNTER — Emergency Department: Payer: Medicare Other

## 2017-06-04 ENCOUNTER — Other Ambulatory Visit: Payer: Self-pay

## 2017-06-04 DIAGNOSIS — Z87891 Personal history of nicotine dependence: Secondary | ICD-10-CM | POA: Insufficient documentation

## 2017-06-04 DIAGNOSIS — Z951 Presence of aortocoronary bypass graft: Secondary | ICD-10-CM | POA: Insufficient documentation

## 2017-06-04 DIAGNOSIS — Z7984 Long term (current) use of oral hypoglycemic drugs: Secondary | ICD-10-CM | POA: Diagnosis not present

## 2017-06-04 DIAGNOSIS — Z7982 Long term (current) use of aspirin: Secondary | ICD-10-CM | POA: Diagnosis not present

## 2017-06-04 DIAGNOSIS — E119 Type 2 diabetes mellitus without complications: Secondary | ICD-10-CM | POA: Diagnosis not present

## 2017-06-04 DIAGNOSIS — I2510 Atherosclerotic heart disease of native coronary artery without angina pectoris: Secondary | ICD-10-CM | POA: Insufficient documentation

## 2017-06-04 DIAGNOSIS — Z79899 Other long term (current) drug therapy: Secondary | ICD-10-CM | POA: Diagnosis not present

## 2017-06-04 DIAGNOSIS — R002 Palpitations: Secondary | ICD-10-CM | POA: Insufficient documentation

## 2017-06-04 DIAGNOSIS — E7800 Pure hypercholesterolemia, unspecified: Secondary | ICD-10-CM | POA: Diagnosis not present

## 2017-06-04 DIAGNOSIS — I10 Essential (primary) hypertension: Secondary | ICD-10-CM | POA: Diagnosis not present

## 2017-06-04 DIAGNOSIS — R079 Chest pain, unspecified: Secondary | ICD-10-CM | POA: Diagnosis present

## 2017-06-04 LAB — BASIC METABOLIC PANEL
Anion gap: 10 (ref 5–15)
BUN: 17 mg/dL (ref 6–20)
CO2: 21 mmol/L — AB (ref 22–32)
Calcium: 8.9 mg/dL (ref 8.9–10.3)
Chloride: 107 mmol/L (ref 101–111)
Creatinine, Ser: 0.98 mg/dL (ref 0.61–1.24)
GFR calc Af Amer: >60 mL/min (ref >60)
GLUCOSE: 134 mg/dL — AB (ref 65–99)
POTASSIUM: 3.6 mmol/L (ref 3.5–5.1)
Sodium: 138 mmol/L (ref 135–145)

## 2017-06-04 LAB — CBC
HEMATOCRIT: 43 % (ref 40.0–52.0)
HEMOGLOBIN: 14.7 g/dL (ref 13.0–18.0)
MCH: 31.4 pg (ref 26.0–34.0)
MCHC: 34.3 g/dL (ref 32.0–36.0)
MCV: 91.4 fL (ref 80.0–100.0)
Platelets: 293 10*3/uL (ref 150–440)
RBC: 4.7 MIL/uL (ref 4.40–5.90)
RDW: 13.8 % (ref 11.5–14.5)
WBC: 9.3 10*3/uL (ref 3.8–10.6)

## 2017-06-04 LAB — TROPONIN I: Troponin I: <0.03 ng/mL (ref <0.03)

## 2017-06-04 NOTE — ED Triage Notes (Signed)
Pt to ed with c/o right side chest pain. Pt states CABG in 2007.  Reports earlier tonight started having a "vibration" in chest on right side that lasted 5 minutes.  Pt denies sob, but does report he felt weak during that time. Pt denies "vibration" feeling at this time. ekg done on arrival.

## 2017-06-05 ENCOUNTER — Emergency Department
Admission: EM | Admit: 2017-06-05 | Discharge: 2017-06-05 | Disposition: A | Discharge status: Home / Self Care | Payer: Medicare Other | Attending: Emergency Medicine | Admitting: Emergency Medicine

## 2017-06-05 DIAGNOSIS — R002 Palpitations: Secondary | ICD-10-CM | POA: Diagnosis not present

## 2017-06-05 DIAGNOSIS — I2510 Atherosclerotic heart disease of native coronary artery without angina pectoris: Secondary | ICD-10-CM

## 2017-06-05 LAB — TROPONIN I: Troponin I: <0.03 ng/mL (ref <0.03)

## 2017-06-05 NOTE — ED Provider Notes (Signed)
[facility] Emergency Department Provider Note  ____________________________________________   First MD Initiated Contact with Patient 06/05/17 [phone]     (approximate)  I have reviewed the triage vital signs and the nursing notes.   HISTORY  Chief Complaint Chest Pain   HPI Tim Miranda is a 72 y.o. male who self presents to the emergency department with a 5-minute episode of "vibration" in his right lateral chest that occurred earlier today.  The symptoms came on suddenly and went away quickly on their own.  He never had any pain.  The symptoms have not recurred.  He became concerned because he has a past medical history of coronary artery bypass graft in 2007 and he was worried that this could represent a heart attack.  He denies shortness of breath.  He denies nausea or vomiting.  He does say that during the episode he felt generally "weak".  Nothing particular seemed to make his symptoms come on or go away.  His symptoms are moderate severity.  Past Medical History:  Diagnosis Date  . Arthritis   . Coronary artery disease   . Degenerative disc disease, lumbar   . Depression   . Diabetes mellitus without complication ([place])   . GERD (gastroesophageal reflux disease)   . Gout   . Hx of CABG   . Hypercholesteremia   . Hypertension   . PLMD (periodic limb movement disorder)   . Sleep apnea   . Thyroid disease     There are no active problems to display for this patient.   Past Surgical History:  Procedure Laterality Date  . BACK SURGERY    . CARPAL TUNNEL RELEASE    . COLONOSCOPY WITH PROPOFOL N/A 09/19/2016   Procedure: COLONOSCOPY WITH PROPOFOL;  Surgeon: Manya Silvas, MD;  [facility] ENDOSCOPY;  Service: Endoscopy;  Laterality: N/A;  . CORONARY ARTERY BYPASS GRAFT    . ESOPHAGOGASTRODUODENOSCOPY (EGD) WITH PROPOFOL N/A 09/19/2016   Procedure: ESOPHAGOGASTRODUODENOSCOPY (EGD) WITH PROPOFOL;  Surgeon: Manya Silvas, MD;  Location:  [facility] ENDOSCOPY;  Service: Endoscopy;  Laterality: N/A;  . EYE SURGERY    . SHOULDER SURGERY      Prior to Admission medications   Medication Sig Start Date End Date Taking? Authorizing Provider  albuterol (PROVENTIL HFA;VENTOLIN HFA) 108 (90 Base) MCG/ACT inhaler Inhale 2 puffs into the lungs every 6 (six) hours as needed for wheezing or shortness of breath. 07/08/16   Nena Polio, MD  allopurinol (ZYLOPRIM) 300 MG tablet Take 300 mg by mouth daily.    [provider]  aspirin EC 81 MG tablet Take 81 mg by mouth daily.    [provider]  carvedilol (COREG) 25 MG tablet Take 12.5 mg by mouth 2 (two) times daily with a meal.    [provider]  Cholecalciferol (VITAMIN D3) 2000 units TABS Take 2,000 Units by mouth 2 (two) times daily.    [provider]  Coenzyme Q10 15 MG CAPS Take 10 mg by mouth daily.    [provider]  fenofibrate 160 MG tablet Take 160 mg by mouth daily.    [provider]  fluticasone (FLONASE) 50 MCG/ACT nasal spray Place 2 sprays into both nostrils daily.    [provider]  glipiZIDE (GLUCOTROL XL) 2.5 MG 24 hr tablet Take 5 mg by mouth daily with breakfast.    [provider]  Javier Docker Oil 500 MG CAPS Take 500 mg by mouth daily.    [provider]  levothyroxine (SYNTHROID, LEVOTHROID) 50 MCG tablet Take 50 mcg by mouth daily before breakfast.    [provider]  losartan (COZAAR) 25 MG tablet Take 25 mg by mouth daily.    [provider]  montelukast (SINGULAIR) 10 MG tablet Take 10 mg by mouth at bedtime.    [provider]  pantoprazole (PROTONIX) 20 MG tablet Take 20 mg by mouth 2 (two) times daily before a meal.    [provider]  pravastatin (PRAVACHOL) 80 MG tablet Take 80 mg by mouth daily.    [provider]  predniSONE (DELTASONE) 20 MG tablet Take 1 tablet (20 mg total) by mouth daily. Patient not taking: Reported on 09/18/2016  07/08/16 07/08/17  Nena Polio, MD    Allergies Isosorbide; Lisinopril; and Metformin and related  History reviewed. No pertinent family history.  Social History Social History   Tobacco Use  . Smoking status: Former Research scientist (life sciences)  . Smokeless tobacco: Never Used  Substance Use Topics  . Alcohol use: No  . Drug use: No    Review of Systems Constitutional: No fever/chills Eyes: No visual changes. ENT: No sore throat. Cardiovascular: Denies chest pain. Respiratory: Denies shortness of breath. Gastrointestinal: No abdominal pain.  No nausea, no vomiting.  No diarrhea.  No constipation. Genitourinary: Negative for dysuria. Musculoskeletal: Negative for back pain. Skin: Negative for rash. Neurological: Negative for headaches, focal weakness or numbness.   ____________________________________________   PHYSICAL EXAM:  VITAL SIGNS: ED Triage Vitals  Enc Vitals Group     BP 06/04/17 1805 (!) 172/74     Pulse Rate 06/04/17 1805 64     Resp 06/04/17 1805 18     Temp 06/04/17 1805 97.8 F (36.6 C)     Temp Source 06/04/17 1805 Oral     SpO2 06/04/17 1805 95 %     Weight 06/04/17 1803 225 lb (102.1 kg)     Height 06/04/17 1803 5\' 8"  (1.727 m)     Head Circumference --      Peak Flow --      Pain Score 06/04/17 1803 0     Pain Loc --      Pain Edu? --      Excl. in [place]? --     Constitutional: Alert and oriented x4 well-appearing nontoxic no diaphoresis speaks full clear sentences Eyes: PERRL EOMI. Head: Atraumatic. Nose: No congestion/rhinnorhea. Mouth/Throat: No trismus Neck: No stridor.   Cardiovascular: Normal rate, regular rhythm. Grossly normal heart sounds.  Good peripheral circulation. Respiratory: Normal respiratory effort.  No retractions. Lungs CTAB and moving good air Gastrointestinal: Soft nontender Musculoskeletal: No lower extremity edema   Neurologic:  Normal speech and language. No gross focal neurologic deficits are appreciated. Skin:  Skin is warm,  dry and intact. No rash noted. Psychiatric: Mood and affect are normal. Speech and behavior are normal.    ____________________________________________   DIFFERENTIAL includes but not limited to  Ventricular tachycardia, acute coronary syndrome, pulmonary embolism, atrial fibrillation ____________________________________________   LABS (all labs ordered are listed, but only abnormal results are displayed)  Labs Reviewed  BASIC METABOLIC PANEL - Abnormal; Notable for the following components:      Result Value   CO2 21 (*)    Glucose, Bld 134 (*)    All other components within normal limits  CBC  TROPONIN I  TROPONIN I    Lab work reviewed by me with no signs of acute ischemia x2 __________________________________________  EKG  ED  ECG REPORT I, Darel Hong, the attending physician, personally viewed and interpreted this ECG.  Date: 06/05/2017 EKG Time:  Rate: 69 Rhythm: normal sinus rhythm QRS Axis: normal Intervals: normal ST/T Wave abnormalities: Right bundle branch block with appropriate repolarization abnormalities unchanged from previous EKG 07/08/16 Narrative Interpretation: no evidence of acute ischemia  ____________________________________________  RADIOLOGY  Chest x-ray reviewed by me with no acute disease ____________________________________________   PROCEDURES  Procedure(s) performed: no  Procedures  Critical Care performed: no  Observation: no ____________________________________________   INITIAL IMPRESSION / ASSESSMENT AND PLAN / ED COURSE  Pertinent labs & imaging results that were available during my care of the patient were reviewed by me and considered in my medical decision making (see chart for details).  The patient arrives quite well-appearing with an unremarkable EKG.  His history of "vibrations" is nonspecific and more likely could represent arrhythmia than actual acute coronary syndrome.  Regardless given his history I do  think he warrants at least 2 troponins.  The patient has been kept on monitor for more than 3 hours with no ectopy.  2 troponins are negative.  His symptoms have not recurred.  I had a lengthy discussion with patient regarding the diagnostic uncertainty but that I felt he was safe to go home with follow-up with cardiology.  He does not have a cardiologist currently and is asking for referral.  He is discharged home in improved condition verbalizes understanding and agreement with the plan.      ____________________________________________   FINAL CLINICAL IMPRESSION(S) / ED DIAGNOSES  Final diagnoses:  Palpitations  Coronary artery disease involving native heart, angina presence unspecified, unspecified vessel or lesion type      NEW MEDICATIONS STARTED DURING THIS VISIT:  Discharge Medication List as of 06/05/2017  2:52 AM       Note:  This document was prepared using Dragon voice recognition software and may include unintentional dictation errors.     Darel Hong, MD 06/07/17 [phone]

## 2017-06-05 NOTE — ED Notes (Signed)
Pt reports vibration in right side of chest while eating tonight.  No n/v/d.  Pt reports that he doesn't feel well.  No sob.  No cough   No fever.  No pain in chest now.  md at bedside.  Family with pt.

## 2017-06-05 NOTE — Discharge Instructions (Signed)
Fortunately today your blood work, your EKG, and your chest x-ray were reassuring.  Please make an appointment to establish care with cardiology here in [facility] and return to the emergency department for any concerns.  It was a pleasure to take care of you today, and thank you for coming to our emergency department.  If you have any questions or concerns before leaving please ask the nurse to grab me and I'm more than happy to go through your aftercare instructions again.  If you were prescribed any opioid pain medication today such as Norco, Vicodin, Percocet, morphine, hydrocodone, or oxycodone please make sure you do not drive when you are taking this medication as it can alter your ability to drive safely.  If you have any concerns once you are home that you are not improving or are in fact getting worse before you can make it to your follow-up appointment, please do not hesitate to call 911 and come back for further evaluation.  Darel Hong, MD  Results for orders placed or performed during the hospital encounter of 96/04/54  Basic metabolic panel  Result Value Ref Range   Sodium 138 135 - 145 mmol/L   Potassium 3.6 3.5 - 5.1 mmol/L   Chloride 107 101 - 111 mmol/L   CO2 21 (L) 22 - 32 mmol/L   Glucose, Bld 134 (H) 65 - 99 mg/dL   BUN 17 6 - 20 mg/dL   Creatinine, Ser 0.98 0.61 - 1.24 mg/dL   Calcium 8.9 8.9 - 10.3 mg/dL   GFR calc non Af Amer >60 >60 mL/min   GFR calc Af Amer >60 >60 mL/min   Anion gap 10 5 - 15  CBC  Result Value Ref Range   WBC 9.3 3.8 - 10.6 K/uL   RBC 4.70 4.40 - 5.90 MIL/uL   Hemoglobin 14.7 13.0 - 18.0 g/dL   HCT 43.0 40.0 - 52.0 %   MCV 91.4 80.0 - 100.0 fL   MCH 31.4 26.0 - 34.0 pg   MCHC 34.3 32.0 - 36.0 g/dL   RDW 13.8 11.5 - 14.5 %   Platelets 293 150 - 440 K/uL  Troponin I  Result Value Ref Range   Troponin I <0.03 <0.03 ng/mL  Troponin I  Result Value Ref Range   Troponin I <0.03 <0.03 ng/mL   Dg Chest 2 View  Result Date:  06/04/2017 CLINICAL DATA:  Chest pain EXAM: CHEST - 2 VIEW COMPARISON:  07/08/2016 FINDINGS: Post sternotomy changes. Coarse chronic appearing interstitial opacity. Linear scarring or atelectasis at the left base. Cardiomediastinal silhouette. No pneumothorax. IMPRESSION: No active cardiopulmonary disease. Hyperinflation with scarring at the bases Electronically Signed   By: Donavan Foil M.D.   On: 06/04/2017 18:38

## 2018-10-27 DIAGNOSIS — D239 Other benign neoplasm of skin, unspecified: Secondary | ICD-10-CM

## 2018-10-27 HISTORY — DX: Other benign neoplasm of skin, unspecified: D23.9

## 2018-11-21 ENCOUNTER — Observation Stay
Admission: EM | Admit: 2018-11-21 | Discharge: 2018-11-22 | Disposition: A | Discharge status: Home / Self Care | Payer: Medicare Other | Attending: Internal Medicine | Admitting: Internal Medicine

## 2018-11-21 ENCOUNTER — Emergency Department: Payer: Medicare Other

## 2018-11-21 ENCOUNTER — Other Ambulatory Visit: Payer: Self-pay

## 2018-11-21 DIAGNOSIS — Z79890 Hormone replacement therapy (postmenopausal): Secondary | ICD-10-CM | POA: Diagnosis not present

## 2018-11-21 DIAGNOSIS — M109 Gout, unspecified: Secondary | ICD-10-CM | POA: Diagnosis not present

## 2018-11-21 DIAGNOSIS — I452 Bifascicular block: Secondary | ICD-10-CM | POA: Diagnosis not present

## 2018-11-21 DIAGNOSIS — G4733 Obstructive sleep apnea (adult) (pediatric): Secondary | ICD-10-CM | POA: Diagnosis not present

## 2018-11-21 DIAGNOSIS — K219 Gastro-esophageal reflux disease without esophagitis: Secondary | ICD-10-CM | POA: Diagnosis not present

## 2018-11-21 DIAGNOSIS — E119 Type 2 diabetes mellitus without complications: Secondary | ICD-10-CM | POA: Insufficient documentation

## 2018-11-21 DIAGNOSIS — M1990 Unspecified osteoarthritis, unspecified site: Secondary | ICD-10-CM | POA: Insufficient documentation

## 2018-11-21 DIAGNOSIS — I110 Hypertensive heart disease with heart failure: Secondary | ICD-10-CM | POA: Insufficient documentation

## 2018-11-21 DIAGNOSIS — Z87891 Personal history of nicotine dependence: Secondary | ICD-10-CM | POA: Insufficient documentation

## 2018-11-21 DIAGNOSIS — Z951 Presence of aortocoronary bypass graft: Secondary | ICD-10-CM | POA: Diagnosis not present

## 2018-11-21 DIAGNOSIS — Z7984 Long term (current) use of oral hypoglycemic drugs: Secondary | ICD-10-CM | POA: Insufficient documentation

## 2018-11-21 DIAGNOSIS — E785 Hyperlipidemia, unspecified: Secondary | ICD-10-CM | POA: Diagnosis not present

## 2018-11-21 DIAGNOSIS — R079 Chest pain, unspecified: Secondary | ICD-10-CM | POA: Diagnosis present

## 2018-11-21 DIAGNOSIS — E039 Hypothyroidism, unspecified: Secondary | ICD-10-CM | POA: Insufficient documentation

## 2018-11-21 DIAGNOSIS — Z20828 Contact with and (suspected) exposure to other viral communicable diseases: Secondary | ICD-10-CM | POA: Diagnosis not present

## 2018-11-21 DIAGNOSIS — E118 Type 2 diabetes mellitus with unspecified complications: Secondary | ICD-10-CM | POA: Diagnosis not present

## 2018-11-21 DIAGNOSIS — I5031 Acute diastolic (congestive) heart failure: Secondary | ICD-10-CM | POA: Insufficient documentation

## 2018-11-21 DIAGNOSIS — Z7951 Long term (current) use of inhaled steroids: Secondary | ICD-10-CM | POA: Insufficient documentation

## 2018-11-21 DIAGNOSIS — I2510 Atherosclerotic heart disease of native coronary artery without angina pectoris: Secondary | ICD-10-CM | POA: Diagnosis not present

## 2018-11-21 DIAGNOSIS — Z7982 Long term (current) use of aspirin: Secondary | ICD-10-CM | POA: Diagnosis not present

## 2018-11-21 DIAGNOSIS — I25118 Atherosclerotic heart disease of native coronary artery with other forms of angina pectoris: Secondary | ICD-10-CM

## 2018-11-21 DIAGNOSIS — R0789 Other chest pain: Secondary | ICD-10-CM | POA: Diagnosis present

## 2018-11-21 DIAGNOSIS — Z79899 Other long term (current) drug therapy: Secondary | ICD-10-CM | POA: Diagnosis not present

## 2018-11-21 DIAGNOSIS — E7800 Pure hypercholesterolemia, unspecified: Secondary | ICD-10-CM | POA: Insufficient documentation

## 2018-11-21 DIAGNOSIS — E782 Mixed hyperlipidemia: Secondary | ICD-10-CM

## 2018-11-21 LAB — LIPID PANEL
Cholesterol: 140 mg/dL (ref 0–200)
HDL: 32 mg/dL — ABNORMAL LOW (ref >40)
LDL Cholesterol: 53 mg/dL (ref 0–99)
Total CHOL/HDL Ratio: 4.4 RATIO
Triglycerides: 277 mg/dL — ABNORMAL HIGH (ref <150)
VLDL: 55 mg/dL — ABNORMAL HIGH (ref 0–40)

## 2018-11-21 LAB — SARS CORONAVIRUS 2 BY RT PCR (HOSPITAL ORDER, PERFORMED IN ~~LOC~~ HOSPITAL LAB): SARS Coronavirus 2: NEGATIVE

## 2018-11-21 LAB — CBC
HCT: 42.2 % (ref 39.0–52.0)
Hemoglobin: 14.7 g/dL (ref 13.0–17.0)
MCH: 31 pg (ref 26.0–34.0)
MCHC: 34.8 g/dL (ref 30.0–36.0)
MCV: 89 fL (ref 80.0–100.0)
Platelets: 256 10*3/uL (ref 150–400)
RBC: 4.74 MIL/uL (ref 4.22–5.81)
RDW: 13.4 % (ref 11.5–15.5)
WBC: 8.2 10*3/uL (ref 4.0–10.5)
nRBC: 0 % (ref 0.0–0.2)

## 2018-11-21 LAB — TROPONIN I (HIGH SENSITIVITY)
Troponin I (High Sensitivity): 4 ng/L (ref <18)
Troponin I (High Sensitivity): 4 ng/L (ref <18)

## 2018-11-21 LAB — BASIC METABOLIC PANEL
Anion gap: 8 (ref 5–15)
BUN: 13 mg/dL (ref 8–23)
CO2: 21 mmol/L — ABNORMAL LOW (ref 22–32)
Calcium: 9 mg/dL (ref 8.9–10.3)
Chloride: 109 mmol/L (ref 98–111)
Creatinine, Ser: 0.81 mg/dL (ref 0.61–1.24)
GFR calc Af Amer: >60 mL/min (ref >60)
GFR calc non Af Amer: >60 mL/min (ref >60)
Glucose, Bld: 143 mg/dL — ABNORMAL HIGH (ref 70–99)
Potassium: 3.8 mmol/L (ref 3.5–5.1)
Sodium: 138 mmol/L (ref 135–145)

## 2018-11-21 LAB — BRAIN NATRIURETIC PEPTIDE: B Natriuretic Peptide: 93 pg/mL (ref 0.0–100.0)

## 2018-11-21 MED ORDER — ASPIRIN EC 81 MG PO TBEC
81.0000 mg | DELAYED_RELEASE_TABLET | Freq: Every day | ORAL | Status: DC
  Administered 2018-11-22: 81 mg via ORAL
  Filled 2018-11-21: qty 1

## 2018-11-21 MED ORDER — LINAGLIPTIN 5 MG PO TABS
5.0000 mg | ORAL_TABLET | Freq: Every day | ORAL | Status: DC
  Administered 2018-11-22: 5 mg via ORAL
  Filled 2018-11-21: qty 1

## 2018-11-21 MED ORDER — FUROSEMIDE 10 MG/ML IJ SOLN
40.0000 mg | Freq: Two times a day (BID) | INTRAMUSCULAR | Status: DC
  Administered 2018-11-21 – 2018-11-22 (×2): 40 mg via INTRAVENOUS
  Filled 2018-11-21 (×2): qty 4

## 2018-11-21 MED ORDER — FISH OIL ODOR-LESS 1200 MG PO CAPS: 3600.0000 mg | ORAL_CAPSULE | Freq: Every day | ORAL | Status: DC

## 2018-11-21 MED ORDER — GLIPIZIDE 10 MG PO TABS
10.0000 mg | ORAL_TABLET | Freq: Two times a day (BID) | ORAL | Status: DC
  Administered 2018-11-21 – 2018-11-22 (×2): 10 mg via ORAL
  Filled 2018-11-21 (×4): qty 1

## 2018-11-21 MED ORDER — ACETAMINOPHEN 650 MG RE SUPP: 650.0000 mg | Freq: Four times a day (QID) | RECTAL | Status: DC | PRN

## 2018-11-21 MED ORDER — ALLOPURINOL 300 MG PO TABS
300.0000 mg | ORAL_TABLET | Freq: Every day | ORAL | Status: DC
  Administered 2018-11-22: 300 mg via ORAL
  Filled 2018-11-21: qty 1

## 2018-11-21 MED ORDER — MONTELUKAST SODIUM 10 MG PO TABS
10.0000 mg | ORAL_TABLET | Freq: Every day | ORAL | Status: DC
  Administered 2018-11-21: 10 mg via ORAL
  Filled 2018-11-21: qty 1

## 2018-11-21 MED ORDER — ACETAMINOPHEN 325 MG PO TABS: 650.0000 mg | ORAL_TABLET | Freq: Four times a day (QID) | ORAL | Status: DC | PRN

## 2018-11-21 MED ORDER — VITAMIN D 25 MCG (1000 UNIT) PO TABS
2000.0000 [IU] | ORAL_TABLET | Freq: Every day | ORAL | Status: DC
  Administered 2018-11-22: 2000 [IU] via ORAL
  Filled 2018-11-21 (×2): qty 2

## 2018-11-21 MED ORDER — POTASSIUM CHLORIDE CRYS ER 20 MEQ PO TBCR
20.0000 meq | EXTENDED_RELEASE_TABLET | Freq: Two times a day (BID) | ORAL | Status: DC
  Administered 2018-11-21 – 2018-11-22 (×2): 20 meq via ORAL
  Filled 2018-11-21 (×2): qty 1

## 2018-11-21 MED ORDER — ONDANSETRON HCL 4 MG PO TABS: 4.0000 mg | ORAL_TABLET | Freq: Four times a day (QID) | ORAL | Status: DC | PRN

## 2018-11-21 MED ORDER — LOSARTAN POTASSIUM 50 MG PO TABS
50.0000 mg | ORAL_TABLET | Freq: Every day | ORAL | Status: DC
  Administered 2018-11-22: 50 mg via ORAL
  Filled 2018-11-21: qty 1

## 2018-11-21 MED ORDER — ONDANSETRON HCL 4 MG/2ML IJ SOLN: 4.0000 mg | Freq: Four times a day (QID) | INTRAMUSCULAR | Status: DC | PRN

## 2018-11-21 MED ORDER — ENOXAPARIN SODIUM 40 MG/0.4ML ~~LOC~~ SOLN
40.0000 mg | SUBCUTANEOUS | Status: DC
  Administered 2018-11-21: 40 mg via SUBCUTANEOUS
  Filled 2018-11-21: qty 0.4

## 2018-11-21 MED ORDER — COENZYME Q10 100 MG PO CAPS: 100.0000 mg | ORAL_CAPSULE | Freq: Every day | ORAL | Status: DC

## 2018-11-21 MED ORDER — MORPHINE SULFATE (PF) 2 MG/ML IV SOLN: 2.0000 mg | INTRAVENOUS | Status: DC | PRN

## 2018-11-21 MED ORDER — ADULT MULTIVITAMIN W/MINERALS CH
1.0000 | ORAL_TABLET | Freq: Every day | ORAL | Status: DC
  Administered 2018-11-22: 1 via ORAL
  Filled 2018-11-21: qty 1

## 2018-11-21 MED ORDER — PRAVASTATIN SODIUM 40 MG PO TABS
80.0000 mg | ORAL_TABLET | Freq: Every day | ORAL | Status: DC
  Administered 2018-11-21: 80 mg via ORAL
  Filled 2018-11-21: qty 2

## 2018-11-21 MED ORDER — LEVOTHYROXINE SODIUM 50 MCG PO TABS: 50.0000 ug | ORAL_TABLET | Freq: Every day | ORAL | Status: DC

## 2018-11-21 MED ORDER — NITROGLYCERIN 0.4 MG SL SUBL: 0.4000 mg | SUBLINGUAL_TABLET | SUBLINGUAL | Status: DC | PRN

## 2018-11-21 MED ORDER — CARVEDILOL 12.5 MG PO TABS
12.5000 mg | ORAL_TABLET | Freq: Two times a day (BID) | ORAL | Status: DC
  Administered 2018-11-21 – 2018-11-22 (×2): 12.5 mg via ORAL
  Filled 2018-11-21 (×2): qty 1

## 2018-11-21 NOTE — ED Notes (Signed)
Report called to tasha rn floor nurse

## 2018-11-21 NOTE — ED Triage Notes (Signed)
Pt arrives to ED via [facility] for central CP that radiates to L arm. States was working in yard yesterday and CP began yesterday but went away. Hx cardiac bypass in 2007. Takes 81mg  ASA this AM. Took more ASA when called EMS. BP was elevated over 200, EMS gave nitro. BP came down to 150. CBG 120.   A&O, speaking in complete sentences. No distress noted upon arrival.

## 2018-11-21 NOTE — ED Notes (Signed)
Nurse unable to take report at this time.

## 2018-11-21 NOTE — Care Management Obs Status (Signed)
[facility] NOTIFICATION   Patient Details  Name: Tim Miranda MRN: [id number] Date of Birth: 1946/01/08   Medicare Observation Status Notification Given:  Yes    Katrina Stack, RN 11/21/2018, 4:55 PM

## 2018-11-21 NOTE — ED Notes (Signed)
ED TO INPATIENT HANDOFF REPORT  ED Nurse Name and Phone #: Dayona Shaheen [phone]  S Name/Age/Gender Tim Miranda 73 y.o. male Room/Bed: ED12A/ED12A  Code Status   Code Status: Not on file  Home/SNF/Other Home Patient oriented to: self, place, time and situation Is this baseline? Yes   Triage Complete: Triage complete  Chief Complaint Chest Pain  Triage Note Pt arrives to ED via [facility] for central CP that radiates to L arm. States was working in yard yesterday and CP began yesterday but went away. Hx cardiac bypass in 2007. Takes 81mg  ASA this AM. Took more ASA when called EMS. BP was elevated over 200, EMS gave nitro. BP came down to 150. CBG 120.   A&O, speaking in complete sentences. No distress noted upon arrival.    Allergies Allergies  Allergen Reactions  . Isosorbide   . Lisinopril   . Metformin And Related     Level of Care/Admitting Diagnosis ED Disposition    ED Disposition Condition Tim Miranda [100120]  Level of Care: Telemetry [5]  Covid Evaluation: Asymptomatic Screening Protocol (No Symptoms)  Diagnosis: Chest pain [id number]  Admitting Physician: Henreitta Leber [id number]  Attending Physician: Henreitta Leber [id number]  PT Class (Do Not Modify): Observation [104]  PT Acc Code (Do Not Modify): Observation [10022]       B Medical/Surgery History Past Medical History:  Diagnosis Date  . Arthritis   . Coronary artery disease   . Degenerative disc disease, lumbar   . Depression   . Diabetes mellitus without complication ([place])   . GERD (gastroesophageal reflux disease)   . Gout   . Hx of CABG   . Hypercholesteremia   . Hypertension   . PLMD (periodic limb movement disorder)   . Sleep apnea   . Thyroid disease    Past Surgical History:  Procedure Laterality Date  . BACK SURGERY    . CARPAL TUNNEL RELEASE    . COLONOSCOPY WITH PROPOFOL N/A 09/19/2016   Procedure: COLONOSCOPY WITH PROPOFOL;   Surgeon: Manya Silvas, MD;  Location: [facility] ENDOSCOPY;  Service: Endoscopy;  Laterality: N/A;  . CORONARY ARTERY BYPASS GRAFT    . ESOPHAGOGASTRODUODENOSCOPY (EGD) WITH PROPOFOL N/A 09/19/2016   Procedure: ESOPHAGOGASTRODUODENOSCOPY (EGD) WITH PROPOFOL;  Surgeon: Manya Silvas, MD;  Location: [facility] ENDOSCOPY;  Service: Endoscopy;  Laterality: N/A;  . EYE SURGERY    . SHOULDER SURGERY       A IV Location/Drains/Wounds Patient Lines/Drains/Airways Status   Active Line/Drains/Airways    Name:   Placement date:   Placement time:   Site:   Days:   Peripheral IV 11/21/18 Left Forearm   11/21/18    1344    Forearm   less than 1   Airway   09/19/16    0925     793          Intake/Output Last 24 hours No intake or output data in the 24 hours ending 11/21/18 1550  Labs/Imaging Results for orders placed or performed during the hospital encounter of 11/21/18 (from the past 48 hour(s))  Basic metabolic panel     Status: Abnormal   Collection Time: 11/21/18  1:35 PM  Result Value Ref Range   Sodium 138 135 - 145 mmol/L   Potassium 3.8 3.5 - 5.1 mmol/L   Chloride 109 98 - 111 mmol/L   CO2 21 (L) 22 - 32 mmol/L   Glucose, Bld 143 (H) 70 -  99 mg/dL   BUN 13 8 - 23 mg/dL   Creatinine, Ser 0.81 0.61 - 1.24 mg/dL   Calcium 9.0 8.9 - 10.3 mg/dL   GFR calc non Af Amer >60 >60 mL/min   GFR calc Af Amer >60 >60 mL/min   Anion gap 8 5 - 15    Comment: Performed at [facility] [address] [place], [place] [address]  CBC     Status: None   Collection Time: 11/21/18  1:35 PM  Result Value Ref Range   WBC 8.2 4.0 - 10.5 K/uL   RBC 4.74 4.22 - 5.81 MIL/uL   Hemoglobin 14.7 13.0 - 17.0 g/dL   HCT 42.2 39.0 - 52.0 %   MCV 89.0 80.0 - 100.0 fL   MCH 31.0 26.0 - 34.0 pg   MCHC 34.8 30.0 - 36.0 g/dL   RDW 13.4 11.5 - 15.5 %   Platelets 256 150 - 400 K/uL   nRBC 0.0 0.0 - 0.2 %    Comment: Performed at [facility], [place], [place] [address]   Troponin I (High Sensitivity)     Status: None   Collection Time: 11/21/18  1:35 PM  Result Value Ref Range   Troponin I (High Sensitivity) 4 <18 ng/L    Comment: (NOTE) Elevated high sensitivity troponin I (hsTnI) values and significant  changes across serial measurements may suggest ACS but many other  chronic and acute conditions are known to elevate hsTnI results.  Refer to the "Links" section for chest pain algorithms and additional  guidance. Performed at [facility] [address] [place], [place] [address]    Dg Chest Portable 1 View  Result Date: 11/21/2018 CLINICAL DATA:  Chest pain EXAM: PORTABLE CHEST 1 VIEW COMPARISON:  06/04/2017 FINDINGS: Cardiac shadow is stable. Postoperative changes are again seen. The lungs are well aerated bilaterally. No focal infiltrate or sizable effusion is seen. No bony abnormality is noted. IMPRESSION: No acute abnormality seen. Electronically Signed   By: Inez Catalina M.D.   On: 11/21/2018 14:37    Pending Labs Unresulted Labs (From admission, onward)    Start     Ordered   11/21/18 1504  SARS Coronavirus 2 [facility] order, Performed in [facility] hospital lab) Nasopharyngeal Nasopharyngeal Swab  (Symptomatic/High Risk of Exposure/Tier 1 Patients Labs with Precautions)  ONCE - STAT,   STAT    Question Answer Comment  Is this test for diagnosis or screening Diagnosis of ill patient   Symptomatic for COVID-19 as defined by CDC Yes   Date of Symptom Onset 11/21/2018   Hospitalized for COVID-19 No   Admitted to ICU for COVID-19 No   Previously tested for COVID-19 No   Resident in a congregate (group) care setting No   Employed in healthcare setting No      11/21/18 1503   Signed and Held  CBC  (enoxaparin (LOVENOX)    CrCl >/= 30 ml/min)  Once,   R    Comments: Baseline for enoxaparin therapy IF NOT ALREADY DRAWN.  Notify MD if PLT < 100 K.    Signed and Held   Signed and Held  Creatinine, serum  (enoxaparin (LOVENOX)     CrCl >/= 30 ml/min)  Once,   R    Comments: Baseline for enoxaparin therapy IF NOT ALREADY DRAWN.    Signed and Held   Signed and Held  Creatinine, serum  (enoxaparin (LOVENOX)    CrCl >/= 30 ml/min)  Weekly,   R    Comments: while on enoxaparin therapy    Signed and Held          Vitals/Pain Today's Vitals   11/21/18 1332 11/21/18 1333 11/21/18 1334 11/21/18 1455  BP:   (!) 148/91   Pulse:   64   Resp:   16   Temp:   98 F (36.7 C)   TempSrc:   Oral   SpO2:   95%   Weight:  104.3 kg    Height:  5\' 9"  (1.753 m)    PainSc: 5    0-No pain    Isolation Precautions No active isolations  Medications Medications - No data to display  Mobility  Low fall risk   Focused Assessments    R Recommendations: See Admitting Provider Note  Report given to:   Additional Notes:

## 2018-11-21 NOTE — Consult Note (Addendum)
Cardiology Consultation:   Patient ID: Tim Miranda MRN: [id number]; DOB: 1945-12-13  Admit date: 11/21/2018 Date of Consult: 11/21/2018  Primary Care Provider: Sherrin Daisy, MD Primary Cardiologist: Iverson Alamin, Dr. Rockey Situ rounding Primary Electrophysiologist:  None    Patient Profile:   Tim Miranda is a 73 y.o. male with a hx of CAD with 3v CABG in 2007 and patent by cath 2010, HLD, HTN (poor tolerance of ACE), DM2, previous smoker (quit 1968) OSA, obesity, GERD, arthritis who is being seen today for the evaluation of chest pain at the request of Dr. Verdell Carmine.  History of Present Illness:   Tim Miranda is a 73 yo male with family history of cardiac disease and stroke and previous patient history of 3v bypass in 2007 with repeat cath 2010 showing bypasses patent by catheterization. 09/27/08 cath showed 40% left main, 80% LAD, 40% ramus 100% OM3 with patent SVG and patent SVG to ramus with patent LIMA -LAD. EF 65%.  He underwent stress echo 01/2010 that showed baseline inferior wall hypokinesia without evidence of ischemia. Last echo and nuclear scan in 2014. Seen in 2017 by [facility] and doing well from a cardiac standpoint. 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 He reportedly had moved to [facility] with recommendation by his cardiologist at [facility] to follow-up with Dr. Nehemiah Massed as a local cardiologist; however, he was lost to follow-up.  On 11/21/2018, he presented to [facility] ED with intermittent chest pain x2 days  That started while doing yard work that consisted of building a wall in his yard (retaining wall). CP was described as exertional pressure that localizes to the center / left side of his chest, as well as down his left arm. Associated symptoms include progressive SOB that started a few days earlier and worsened over the last two days. No reported nausea but did note decrease in appetite. He reportedly received SL nitro from EMS with improvement in sx. In the ED,  EKG without acute ST/T changes and Tn negative.    Heart Pathway Score:     Past Medical History:  Diagnosis Date  . Arthritis   . Coronary artery disease   . Degenerative disc disease, lumbar   . Depression   . Diabetes mellitus without complication ([place])   . GERD (gastroesophageal reflux disease)   . Gout   . Hx of CABG   . Hypercholesteremia   . Hypertension   . PLMD (periodic limb movement disorder)   . Sleep apnea   . Thyroid disease     Past Surgical History:  Procedure Laterality Date  . BACK SURGERY    . CARPAL TUNNEL RELEASE    . COLONOSCOPY WITH PROPOFOL N/A 09/19/2016   Procedure: COLONOSCOPY WITH PROPOFOL;  Surgeon: Manya Silvas, MD;  Location: [facility] ENDOSCOPY;  Service: Endoscopy;  Laterality: N/A;  . CORONARY ARTERY BYPASS GRAFT    . ESOPHAGOGASTRODUODENOSCOPY (EGD) WITH PROPOFOL N/A 09/19/2016   Procedure: ESOPHAGOGASTRODUODENOSCOPY (EGD) WITH PROPOFOL;  Surgeon: Manya Silvas, MD;  Location: [facility] ENDOSCOPY;  Service: Endoscopy;  Laterality: N/A;  . EYE SURGERY    . SHOULDER SURGERY       Home Medications:  Prior to Admission medications   Medication Sig Start Date End Date Taking? Authorizing Provider  allopurinol (ZYLOPRIM) 300 MG tablet Take 300 mg by mouth daily.   Yes [provider]  aspirin EC 81 MG tablet Take 81 mg by mouth daily.   Yes [provider]  carvedilol (COREG)  12.5 MG tablet Take 12.5 mg by mouth 2 (two) times daily.    Yes [provider]  cholecalciferol (VITAMIN D) 25 MCG (1000 UT) tablet Take 2,000 Units by mouth daily.    Yes [provider]  Coenzyme Q10 100 MG capsule Take 100 mg by mouth daily.    Yes [provider]  glipiZIDE (GLUCOTROL) 10 MG tablet Take 10 mg by mouth 2 (two) times daily before a meal.   Yes [provider]  levothyroxine (SYNTHROID, LEVOTHROID) 50 MCG tablet Take 50 mcg by mouth daily before breakfast.   Yes [provider]  losartan  (COZAAR) 50 MG tablet Take 50 mg by mouth daily.    Yes [provider]  Misc Natural Products ([place]) Take 1 tablet by mouth daily.   Yes [provider]  montelukast (SINGULAIR) 10 MG tablet Take 10 mg by mouth at bedtime.   Yes [provider]  Multiple Vitamins-Minerals (MULTIVITAMINS THER. W/MINERALS) TABS tablet Take 1 tablet by mouth daily.   Yes [provider]  Omega-3 Fatty Acids (FISH OIL ODOR-LESS) 1200 MG CAPS Take 3,600 mg by mouth at bedtime.   Yes [provider]  ONGLYZA 5 MG TABS tablet Take 5 mg by mouth daily. 11/06/18  Yes [provider]  pravastatin (PRAVACHOL) 80 MG tablet Take 80 mg by mouth at bedtime.    Yes [provider]    Inpatient Medications: Scheduled Meds:  Continuous Infusions:  PRN Meds:   Allergies:    Allergies  Allergen Reactions  . Isosorbide   . Lisinopril   . Metformin And Related     Social History:   Social History   Socioeconomic History  . Marital status: Married    Spouse name: Not on file  . Number of children: Not on file  . Years of education: Not on file  . Highest education level: Not on file  Occupational History  . Not on file  Social Needs  . Financial resource strain: Not on file  . Food insecurity    Worry: Not on file    Inability: Not on file  . Transportation needs    Medical: Not on file    Non-medical: Not on file  Tobacco Use  . Smoking status: Former Smoker    Packs/day: 0.25    Years: 3.00    Pack years: 0.75    Types: Cigarettes  . Smokeless tobacco: Never Used  Substance and Sexual Activity  . Alcohol use: No  . Drug use: No  . Sexual activity: Not on file  Lifestyle  . Physical activity    Days per week: Not on file    Minutes per session: Not on file  . Stress: Not on file  Relationships  . Social Herbalist on phone: Not on file    Gets together: Not on file    Attends religious  service: Not on file    Active member of club or organization: Not on file    Attends meetings of clubs or organizations: Not on file    Relationship status: Not on file  . Intimate partner violence    Fear of current or ex partner: Not on file    Emotionally abused: Not on file    Physically abused: Not on file    Forced sexual activity: Not on file  Other Topics Concern  . Not on file  Social History Narrative  . Not on  file    Family History:    Family History  Problem Relation Age of Onset  . Heart disease Mother   . Heart disease Father      ROS:  Please see the history of present illness.  Review of Systems  Constitutional: Negative for chills and fever.  Respiratory: Negative for shortness of breath.   Cardiovascular: Positive for chest pain.  Musculoskeletal: Negative for falls.  All other systems reviewed and are negative.   All other ROS reviewed and negative.     Physical Exam/Data:   Vitals:   11/21/18 1500 11/21/18 1530 11/21/18 1600 11/21/18 1630  BP: 137/75 (!) 162/80 (!) 148/74 (!) 162/79  Pulse: (!) 56 (!) 57 (!) 52 (!) 50  Resp: 18  18 14   Temp:      TempSrc:      SpO2: 95% 97% 94% 96%  Weight:      Height:       No intake or output data in the 24 hours ending 11/21/18 1712 Last 3 Weights 11/21/2018 06/04/2017 09/19/2016  Weight (lbs) 230 lb 225 lb 230 lb  Weight (kg) 104.327 kg 102.059 kg 104.327 kg     Body mass index is 33.97 kg/m.  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 No results for input(s): TROPIPOC in the last 168 hours.  Chemistry Recent Labs  Lab 11/21/18 1335  NA 138  K 3.8  CL 109  CO2 21*  GLUCOSE 143*  BUN 13  CREATININE 0.81  CALCIUM 9.0  GFRNONAA >60  GFRAA >60  ANIONGAP 8    No results for input(s): PROT, ALBUMIN, AST, ALT, ALKPHOS, BILITOT in the last 168 hours. Hematology Recent Labs  Lab 11/21/18 1335  WBC 8.2  RBC 4.74  HGB 14.7  HCT 42.2  MCV 89.0  MCH 31.0  MCHC 34.8  RDW 13.4  PLT 256   BNPNo results for input(s): BNP, PROBNP in the last 168 hours.  DDimer No results for input(s): DDIMER in the last 168 hours.   Radiology/Studies:  Dg Chest Portable 1 View  Result Date: 11/21/2018 CLINICAL DATA:  Chest pain EXAM: PORTABLE CHEST 1 VIEW COMPARISON:  06/04/2017 FINDINGS: Cardiac shadow is stable. Postoperative changes are again seen. The lungs are well aerated bilaterally. No focal infiltrate or sizable effusion is seen. No bony abnormality is noted. IMPRESSION: No acute abnormality seen. Electronically Signed   By: Inez Catalina M.D.   On: 11/21/2018 14:37    Assessment and Plan:   Chest pain without elevated Tn History of CAD/CABG --No current CP. Reported multiple CP episodes x2 days. Associated SOB. CP  concerning for cardiac etiology; however, Tn has been negative and EKG without acute ST/T changes. Patient does have risk factors for cardiac etiology including significant cardiac history as above with CABG, HTN, HLD, and previous smoker.  --Lexiscan stress test tomorrow 9/12 for further ischemic workup  as cannot r/o cardiac etiology of CP at this time.   --N.p.o. after midnight with heart healthy diet and carb modified diet until that time. --Labs ordered for further re-stratification include daily BMET, CBC, hemoglobin A1c. Last LDL 53. --Pending updated echocardiogram, ordered today. --No indication for emergent and invasive ischemic work-up with cardiac catheterization at this time.  If echocardiogram shows significant EF or stress test shows cardiac ischemia, he may need scheduled for cath at that time.  --Continue DVT prophylaxis.  --Continue PTA medications, including ASA, ARB, BB, and statin. SL nitro as needed for CP.   HFpEF --SOB in the setting of volume overload with physical exam positive for bibasilar crackles and abdominal distention.  Will order BNP. --No PTA diuretic.   --IV Lasix 40mg  twice daily started and to be titrated based on output, renal function, and physical exam.  --KCl tab 84mEq BID added as K 3.8 before start of diuresis - adjust if needed based on tomorrow morning's BMET.  --BMET to continue to continue monitor renal function and electrolytes.  Current renal function stable with Cr 0.81 and BUN 13. --Monitor I's/O's, daily weights.   --Continue IV diuresis until euvolemic on exam or bump in renal function with goal output 2L daily. Echo pending with further recommendations at that time.  HTN --BP elevated in the setting of volume overload.   --Continue Coreg 12.5 mg twice daily as HR allows.  Escalation of Coreg limited by bradycardic rates at this time.   --Continue losartan 50mg  daily as renal function and potassium allow.  --Continue lasix as renal function allows. --If BP remains elevated after diuresis / euvolemic, will need to consider additional antihypertensives once we determine current EF for more optimal BP control moving forward.   Asymptomatic bradycardia --Asymptomatic bradycardic rates on PTA Coreg 12.5 mg twice daily. Rates in the 50s-60s with  elevated BP. EKG shows LAFB and RBBB. Will continue to monitor for now as asymptomatic and stable.   HLD --Continue statin. LDL 53 and at goal of <70. --Last LFTs stable in 2018. Consider recheck.  DM2 --SSI. Per IM  For questions or updates, please contact [place] Please consult [email or web address] for contact info under     Signed, Arvil Chaco, PA-C  11/21/2018 5:12 PM

## 2018-11-21 NOTE — H&P (Signed)
[place] [facility] [place] NAME: Tim Miranda    MR#:  [id number]  DATE OF BIRTH:  07-24-1945  DATE OF ADMISSION:  11/21/2018  PRIMARY CARE PHYSICIAN: Sherrin Daisy, MD   REQUESTING/REFERRING PHYSICIAN: Dr. Derrell Lolling  CHIEF COMPLAINT:   Chief Complaint  Patient presents with  . Chest Pain    HISTORY OF PRESENT ILLNESS:  Tim Miranda  is a 73 y.o. male with a known history of coronary artery disease status post CABG, diabetes, hypertension, history of gout, obstructive sleep apnea, hypothyroidism who presents to the hospital complaining of chest pain/pressure.  Patient says he was in his usual state of health and developed some chest pain/pressure earlier this morning which was associated with some shortness of breath.  Patient had some left arm pain yesterday while he was working in the yard but did not think much of it, but he had no associated shortness of breath diaphoresis palpitations dizziness or chest pain associated with that.  Today he started develop some chest pain/pressure which lasted about a minute or 2 associated with shortness of breath and therefore came to the hospital for further evaluation.  Given patient's previous history of coronary disease and bypass surgery and no recent cardiac work-up hospital services were contacted for admission.  Patient's first set of cardiac markers are negative and his EKG shows no acute ST or T wave changes.  PAST MEDICAL HISTORY:   Past Medical History:  Diagnosis Date  . Arthritis   . Coronary artery disease   . Degenerative disc disease, lumbar   . Depression   . Diabetes mellitus without complication ([place])   . GERD (gastroesophageal reflux disease)   . Gout   . Hx of CABG   . Hypercholesteremia   . Hypertension   . PLMD (periodic limb movement disorder)   . Sleep apnea   . Thyroid disease     PAST SURGICAL HISTORY:   Past Surgical History:  Procedure Laterality Date  . BACK  SURGERY    . CARPAL TUNNEL RELEASE    . COLONOSCOPY WITH PROPOFOL N/A 09/19/2016   Procedure: COLONOSCOPY WITH PROPOFOL;  Surgeon: Manya Silvas, MD;  Location: [facility] ENDOSCOPY;  Service: Endoscopy;  Laterality: N/A;  . CORONARY ARTERY BYPASS GRAFT    . ESOPHAGOGASTRODUODENOSCOPY (EGD) WITH PROPOFOL N/A 09/19/2016   Procedure: ESOPHAGOGASTRODUODENOSCOPY (EGD) WITH PROPOFOL;  Surgeon: Manya Silvas, MD;  Location: [facility] ENDOSCOPY;  Service: Endoscopy;  Laterality: N/A;  . EYE SURGERY    . SHOULDER SURGERY      SOCIAL HISTORY:   Social History   Tobacco Use  . Smoking status: Former Smoker    Packs/day: 0.25    Years: 3.00    Pack years: 0.75    Types: Cigarettes  . Smokeless tobacco: Never Used  Substance Use Topics  . Alcohol use: No    FAMILY HISTORY:   Family History  Problem Relation Age of Onset  . Heart disease Mother   . Heart disease Father     DRUG ALLERGIES:   Allergies  Allergen Reactions  . Isosorbide   . Lisinopril   . Metformin And Related     REVIEW OF SYSTEMS:   Review of Systems  Constitutional: Negative for fever and weight loss.  HENT: Negative for congestion, nosebleeds and tinnitus.   Eyes: Negative for blurred vision, double vision and redness.  Respiratory: Negative for cough, hemoptysis and shortness of breath.   Cardiovascular: Positive for chest pain. Negative  for orthopnea, leg swelling and PND.  Gastrointestinal: Negative for abdominal pain, diarrhea, melena, nausea and vomiting.  Genitourinary: Negative for dysuria, hematuria and urgency.  Musculoskeletal: Negative for falls and joint pain.  Neurological: Negative for dizziness, tingling, sensory change, focal weakness, seizures, weakness and headaches.  Endo/Heme/Allergies: Negative for polydipsia. Does not bruise/bleed easily.  Psychiatric/Behavioral: Negative for depression and memory loss. The patient is not nervous/anxious.     MEDICATIONS AT HOME:   Prior to Admission  medications   Medication Sig Start Date End Date Taking? Authorizing Provider  allopurinol (ZYLOPRIM) 300 MG tablet Take 300 mg by mouth daily.   Yes [provider]  aspirin EC 81 MG tablet Take 81 mg by mouth daily.   Yes [provider]  carvedilol (COREG) 12.5 MG tablet Take 12.5 mg by mouth 2 (two) times daily.    Yes [provider]  cholecalciferol (VITAMIN D) 25 MCG (1000 UT) tablet Take 2,000 Units by mouth daily.    Yes [provider]  Coenzyme Q10 100 MG capsule Take 100 mg by mouth daily.    Yes [provider]  glipiZIDE (GLUCOTROL) 10 MG tablet Take 10 mg by mouth 2 (two) times daily before a meal.   Yes [provider]  levothyroxine (SYNTHROID, LEVOTHROID) 50 MCG tablet Take 50 mcg by mouth daily before breakfast.   Yes [provider]  losartan (COZAAR) 50 MG tablet Take 50 mg by mouth daily.    Yes [provider]  Misc Natural Products ([place]) Take 1 tablet by mouth daily.   Yes [provider]  montelukast (SINGULAIR) 10 MG tablet Take 10 mg by mouth at bedtime.   Yes [provider]  Multiple Vitamins-Minerals (MULTIVITAMINS THER. W/MINERALS) TABS tablet Take 1 tablet by mouth daily.   Yes [provider]  Omega-3 Fatty Acids (FISH OIL ODOR-LESS) 1200 MG CAPS Take 3,600 mg by mouth at bedtime.   Yes [provider]  ONGLYZA 5 MG TABS tablet Take 5 mg by mouth daily. 11/06/18  Yes [provider]  pravastatin (PRAVACHOL) 80 MG tablet Take 80 mg by mouth at bedtime.    Yes [provider]      VITAL SIGNS:  Blood pressure (!) 148/91, pulse 64, temperature 98 F (36.7 C), temperature source Oral, resp. rate 16, height 5\' 9"  (1.753 m), weight 104.3 kg, SpO2 95 %.  PHYSICAL EXAMINATION:  Physical Exam  GENERAL:  73 y.o.-year-old patient lying in the bed in no acute distress.  EYES: Pupils equal, round, reactive to light and  accommodation. No scleral icterus. Extraocular muscles intact.  HEENT: Head atraumatic, normocephalic. Oropharynx and nasopharynx clear. No oropharyngeal erythema, moist oral mucosa  NECK:  Supple, no jugular venous distention. No thyroid enlargement, no tenderness.  LUNGS: Normal breath sounds bilaterally, no wheezing, rales, rhonchi. No use of accessory muscles of respiration.  CARDIOVASCULAR: S1, S2 RRR. No murmurs, rubs, gallops, clicks.  ABDOMEN: Soft, nontender, nondistended. Bowel sounds present. No organomegaly or mass.  EXTREMITIES: No pedal edema, cyanosis, or clubbing. + 2 pedal & radial pulses b/l.   NEUROLOGIC: Cranial nerves II through XII are intact. No focal Motor or sensory deficits appreciated b/l PSYCHIATRIC: The patient is alert and oriented x 3.  SKIN: No obvious rash, lesion, or ulcer.   LABORATORY PANEL:   CBC Recent Labs  Lab 11/21/18 1335  WBC 8.2  HGB 14.7  HCT 42.2  PLT 256   ------------------------------------------------------------------------------------------------------------------  Chemistries  Recent  Labs  Lab 11/21/18 1335  NA 138  K 3.8  CL 109  CO2 21*  GLUCOSE 143*  BUN 13  CREATININE 0.81  CALCIUM 9.0   ------------------------------------------------------------------------------------------------------------------  Cardiac Enzymes No results for input(s): TROPONINI in the last 168 hours. ------------------------------------------------------------------------------------------------------------------  RADIOLOGY:  Dg Chest Portable 1 View  Result Date: 11/21/2018 CLINICAL DATA:  Chest pain EXAM: PORTABLE CHEST 1 VIEW COMPARISON:  06/04/2017 FINDINGS: Cardiac shadow is stable. Postoperative changes are again seen. The lungs are well aerated bilaterally. No focal infiltrate or sizable effusion is seen. No bony abnormality is noted. IMPRESSION: No acute abnormality seen. Electronically Signed   By: Inez Catalina M.D.   On: 11/21/2018  14:37     IMPRESSION AND PLAN:   73 y.o. male with a known history of coronary artery disease status post CABG, diabetes, hypertension, history of gout, obstructive sleep apnea, hypothyroidism who presents to the hospital complaining of chest pain/pressure.  1.  Chest pain/pressure -given patient's history of coronary artery disease and bypass and diabetes patient does have significant risk factors for underlying angina.  We will observe him overnight on telemetry, cycle his cardiac markers. -Patient's first troponin is negative. -EKG shows no acute ST or T wave changes. -Continue aspirin, nitroglycerin, beta-blocker, statin. - We will get cardiology consult and a nuclear medicine stress test in the morning.  2.  Essential hypertension-continue carvedilol, losartan.  3.  History of gout-no acute attack.  Continue allopurinol.  4.  Diabetes type 2 without complication-continue glipizide, Tradjenta.  5.  Hypothyroidism-continue Synthroid.  6.  Hyperlipidemia-continue Pravachol.    All the records are reviewed and case discussed with ED provider. Management plans discussed with the patient, family and they are in agreement.  CODE STATUS: Full code  TOTAL TIME TAKING CARE OF THIS PATIENT: 40 minutes.    Henreitta Leber M.D on 11/21/2018 at 3:49 PM  Between 7am to 6pm - Pager - [phone]  After 6pm go to [email or web address] - password [email or web address]  [facility]  Office  [phone]  CC: Primary care physician; Sherrin Daisy, MD

## 2018-11-21 NOTE — ED Provider Notes (Signed)
[facility] Emergency Department Provider Note  ____________________________________________   First MD Initiated Contact with Patient 11/21/18 1329     (approximate)  I have reviewed the triage vital signs and the nursing notes.  History  Chief Complaint Chest Pain    HPI Tim Miranda is a 73 y.o. male history of CAD, s/p CABG (2007), who presents for chest pain.  He describes it as a pressure, burning sensation.  Localizes it to the center and left side of his chest.  Radiates to his left arm.  Associated with SOB as well.  Denies any nausea, but states he has had a decreased appetite.  Symptoms have been present for about 2 days, starting after he mowed the lawn.  They seem to wax and wane in severity.  He received sublingual nitro with EMS, and reports improvement in his symptoms with that.         Past Medical Hx Past Medical History:  Diagnosis Date  . Arthritis   . Coronary artery disease   . Degenerative disc disease, lumbar   . Depression   . Diabetes mellitus without complication ([place])   . GERD (gastroesophageal reflux disease)   . Gout   . Hx of CABG   . Hypercholesteremia   . Hypertension   . PLMD (periodic limb movement disorder)   . Sleep apnea   . Thyroid disease     Problem List There are no active problems to display for this patient.   Past Surgical Hx Past Surgical History:  Procedure Laterality Date  . BACK SURGERY    . CARPAL TUNNEL RELEASE    . COLONOSCOPY WITH PROPOFOL N/A 09/19/2016   Procedure: COLONOSCOPY WITH PROPOFOL;  Surgeon: Manya Silvas, MD;  Location: [facility] ENDOSCOPY;  Service: Endoscopy;  Laterality: N/A;  . CORONARY ARTERY BYPASS GRAFT    . ESOPHAGOGASTRODUODENOSCOPY (EGD) WITH PROPOFOL N/A 09/19/2016   Procedure: ESOPHAGOGASTRODUODENOSCOPY (EGD) WITH PROPOFOL;  Surgeon: Manya Silvas, MD;  Location: [facility] ENDOSCOPY;  Service: Endoscopy;  Laterality: N/A;  . EYE SURGERY    . SHOULDER SURGERY       Medications Prior to Admission medications   Medication Sig Start Date End Date Taking? Authorizing Provider  albuterol (PROVENTIL HFA;VENTOLIN HFA) 108 (90 Base) MCG/ACT inhaler Inhale 2 puffs into the lungs every 6 (six) hours as needed for wheezing or shortness of breath. 07/08/16   Nena Polio, MD  allopurinol (ZYLOPRIM) 300 MG tablet Take 300 mg by mouth daily.    [provider]  aspirin EC 81 MG tablet Take 81 mg by mouth daily.    [provider]  carvedilol (COREG) 25 MG tablet Take 12.5 mg by mouth 2 (two) times daily with a meal.    [provider]  Cholecalciferol (VITAMIN D3) 2000 units TABS Take 2,000 Units by mouth 2 (two) times daily.    [provider]  Coenzyme Q10 15 MG CAPS Take 10 mg by mouth daily.    [provider]  fenofibrate 160 MG tablet Take 160 mg by mouth daily.    [provider]  fluticasone (FLONASE) 50 MCG/ACT nasal spray Place 2 sprays into both nostrils daily.    [provider]  glipiZIDE (GLUCOTROL XL) 2.5 MG 24 hr tablet Take 5 mg by mouth daily with breakfast.    [provider]  Javier Docker Oil 500 MG CAPS Take 500 mg by mouth daily.    [provider]  levothyroxine (SYNTHROID, LEVOTHROID) 50 MCG  tablet Take 50 mcg by mouth daily before breakfast.    [provider]  losartan (COZAAR) 25 MG tablet Take 25 mg by mouth daily.    [provider]  montelukast (SINGULAIR) 10 MG tablet Take 10 mg by mouth at bedtime.    [provider]  pantoprazole (PROTONIX) 20 MG tablet Take 20 mg by mouth 2 (two) times daily before a meal.    [provider]  pravastatin (PRAVACHOL) 80 MG tablet Take 80 mg by mouth daily.    [provider]    Allergies Isosorbide, Lisinopril, and Metformin and related  Family Hx History reviewed. No pertinent family history.  Social Hx Social History   Tobacco Use  . Smoking status: Former Research scientist (life sciences)  .  Smokeless tobacco: Never Used  Substance Use Topics  . Alcohol use: No  . Drug use: No     Review of Systems  Constitutional: Negative for fever. Negative for chills. Eyes: Negative for visual changes. ENT: Negative for sore throat. Cardiovascular: + for chest pain. Respiratory: + for shortness of breath. Gastrointestinal: Negative for abdominal pain. Negative for nausea. Negative for vomiting. Genitourinary: Negative for dysuria. Musculoskeletal: Negative for leg swelling. Skin: Negative for rash. Neurological: Negative for for headaches.   Physical Exam  Vital Signs: ED Triage Vitals  Enc Vitals Group     BP 11/21/18 1334 (!) 148/91     Pulse Rate 11/21/18 1334 64     Resp 11/21/18 1334 16     Temp 11/21/18 1334 98 F (36.7 C)     Temp Source 11/21/18 1334 Oral     SpO2 11/21/18 1334 95 %     Weight 11/21/18 1333 230 lb (104.3 kg)     Height 11/21/18 1333 5\' 9"  (1.753 m)     Head Circumference --      Peak Flow --      Pain Score 11/21/18 1332 5     Pain Loc --      Pain Edu? --      Excl. in [place]? --     Constitutional: Alert and oriented.  Eyes: Conjunctivae clear. Sclera anicteric. Head: Normocephalic. Atraumatic. Nose: No congestion. No rhinorrhea. Mouth/Throat: Mucous membranes are moist.  Neck: No stridor.   Cardiovascular: Normal rate, regular rhythm. No murmurs. Extremities well perfused. Respiratory: Normal respiratory effort.  Lungs CTAB. Gastrointestinal: Soft and non-tender. No distention.  Musculoskeletal: No lower extremity edema. Neurologic:  Normal speech and language. No gross focal neurologic deficits are appreciated.  Skin: Skin is warm, dry and intact. No rash noted. Psychiatric: Mood and affect are appropriate for situation.  EKG  Personally reviewed.   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     Procedures  Procedure(s) performed (including critical care):  Procedures   Initial Impression / Assessment and Plan / ED Course  73 y.o. male with a history of CAD, status post CABG who presents to the ED for chest pressure, as above.  Ddx: ACS, GERD  Plan: EKG, labs, x-ray.  Patient has already received full dose aspirin prior to arrival.  EKG grossly unchanged compared to prior, no acute ischemic changes.  Initial high-sensitivity troponin negative.  However, given his high risk medical history, no recent cardiac work up, as well as concerning chest pain description, will plan for  admission for further cardiac work-up.  Patient is agreeable with plan.  Discussed with hospitalist for admission.   Final Clinical Impression(s) / ED Diagnosis  Final diagnoses:  Chest pressure     Note:  This document was prepared using Dragon voice recognition software and may include unintentional dictation errors.   Lilia Pro., MD 11/21/18 1640

## 2018-11-21 NOTE — Progress Notes (Signed)
PHARMACIST - PHYSICIAN ORDER COMMUNICATION  CONCERNING: P&T Medication Policy on Herbal Medications  DESCRIPTION:  This patient's order for:  CoQ10 and fish oil  has been noted.  This product(s) is classified as an "herbal" or natural product. Due to a lack of definitive safety studies or FDA approval, nonstandard manufacturing practices, plus the potential risk of unknown drug-drug interactions while on inpatient medications, the Pharmacy and Therapeutics Committee does not permit the use of "herbal" or natural products of this type within [facility].   ACTION TAKEN: The pharmacy department is unable to verify this order at this time and your patient has been informed of this safety policy. Please reevaluate patient's clinical condition at discharge and address if the herbal or natural product(s) should be resumed at that time.  Dorena Bodo, PharmD

## 2018-11-21 NOTE — ED Notes (Signed)
Attempted to call report at this time. Unable to give report at this time.

## 2018-11-22 ENCOUNTER — Observation Stay (HOSPITAL_BASED_OUTPATIENT_CLINIC_OR_DEPARTMENT_OTHER): Payer: Medicare Other

## 2018-11-22 ENCOUNTER — Observation Stay (HOSPITAL_BASED_OUTPATIENT_CLINIC_OR_DEPARTMENT_OTHER)
Admit: 2018-11-22 | Discharge: 2018-11-22 | Disposition: A | Discharge status: Home / Self Care | Payer: Medicare Other | Attending: Physician Assistant | Admitting: Physician Assistant

## 2018-11-22 DIAGNOSIS — R079 Chest pain, unspecified: Secondary | ICD-10-CM

## 2018-11-22 DIAGNOSIS — R0602 Shortness of breath: Secondary | ICD-10-CM

## 2018-11-22 DIAGNOSIS — R0789 Other chest pain: Secondary | ICD-10-CM | POA: Diagnosis not present

## 2018-11-22 DIAGNOSIS — I25708 Atherosclerosis of coronary artery bypass graft(s), unspecified, with other forms of angina pectoris: Secondary | ICD-10-CM

## 2018-11-22 LAB — BASIC METABOLIC PANEL
Anion gap: 10 (ref 5–15)
BUN: 18 mg/dL (ref 8–23)
CO2: 27 mmol/L (ref 22–32)
Calcium: 9.2 mg/dL (ref 8.9–10.3)
Chloride: 105 mmol/L (ref 98–111)
Creatinine, Ser: 1.22 mg/dL (ref 0.61–1.24)
GFR calc Af Amer: >60 mL/min (ref >60)
GFR calc non Af Amer: 58 mL/min — ABNORMAL LOW (ref >60)
Glucose, Bld: 168 mg/dL — ABNORMAL HIGH (ref 70–99)
Potassium: 4.6 mmol/L (ref 3.5–5.1)
Sodium: 142 mmol/L (ref 135–145)

## 2018-11-22 LAB — CBC
HCT: 44.9 % (ref 39.0–52.0)
Hemoglobin: 15.5 g/dL (ref 13.0–17.0)
MCH: 31.2 pg (ref 26.0–34.0)
MCHC: 34.5 g/dL (ref 30.0–36.0)
MCV: 90.3 fL (ref 80.0–100.0)
Platelets: 272 10*3/uL (ref 150–400)
RBC: 4.97 MIL/uL (ref 4.22–5.81)
RDW: 13.7 % (ref 11.5–15.5)
WBC: 10.1 10*3/uL (ref 4.0–10.5)
nRBC: 0 % (ref 0.0–0.2)

## 2018-11-22 LAB — ECHOCARDIOGRAM COMPLETE
Height: 69 in
Weight: 3548.8 oz

## 2018-11-22 LAB — NM MYOCAR MULTI W/SPECT W/WALL MOTION / EF
LV dias vol: 48 mL (ref 62–150)
LV sys vol: 18 mL
Peak HR: 94 {beats}/min
Rest HR: 62 {beats}/min
SDS: 0
SRS: 0
SSS: 0
TID: 1.14

## 2018-11-22 LAB — HEMOGLOBIN A1C
Hgb A1c MFr Bld: 7.9 % — ABNORMAL HIGH (ref 4.8–5.6)
Mean Plasma Glucose: 180 mg/dL

## 2018-11-22 MED ORDER — FUROSEMIDE 40 MG PO TABS: 20.0000 mg | ORAL_TABLET | Freq: Every day | ORAL | 0 refills | Status: AC | PRN

## 2018-11-22 MED ORDER — TECHNETIUM TC 99M TETROFOSMIN IV KIT
10.9900 | PACK | Freq: Once | INTRAVENOUS | Status: AC | PRN
  Administered 2018-11-22: 10.99 via INTRAVENOUS

## 2018-11-22 MED ORDER — TECHNETIUM TC 99M TETROFOSMIN IV KIT
33.5500 | PACK | Freq: Once | INTRAVENOUS | Status: AC | PRN
  Administered 2018-11-22: 33.55 via INTRAVENOUS

## 2018-11-22 MED ORDER — PERFLUTREN LIPID MICROSPHERE
1.0000 mL | INTRAVENOUS | Status: AC | PRN
  Administered 2018-11-22: 3 mL via INTRAVENOUS
  Filled 2018-11-22: qty 10

## 2018-11-22 MED ORDER — REGADENOSON 0.4 MG/5ML IV SOLN
0.4000 mg | Freq: Once | INTRAVENOUS | Status: AC
  Administered 2018-11-22: 0.4 mg via INTRAVENOUS

## 2018-11-22 NOTE — Discharge Instructions (Signed)
It was so nice to meet you during this hospitalization!  You came into the hospital with shortness of breath and chest tightness. We did a stress test, which came back negative. We also did an ultrasound of your heart (which is called an ECHO), which showed that your heart is not relaxing like it should. This can cause fluid to build-up in your lungs. We gave you a fluid medicine called lasix, through your IV, to help get that extra fluid off your lungs. I have prescribed lasix tablets for you to go home with. You should weigh yourself every day. If your weight increases by 3 lbs or more, please take Lasix that day. You can also take the lasix as needed for fluid build-up in your legs.  Take care, Dr. Brett Albino

## 2018-11-22 NOTE — Progress Notes (Signed)
*  PRELIMINARY RESULTS* Echocardiogram 2D Echocardiogram has been performed. Definity IV Contrast used on this study.  Tim Miranda Tim Miranda 11/22/2018, 11:25 AM

## 2018-11-22 NOTE — Plan of Care (Signed)
Patient being discharged today. No c/o pain no distress, stress test negative

## 2018-11-22 NOTE — Progress Notes (Signed)
[place] to be D/C'd Home per MD order.  Discussed prescription and follow up appointments with the patient. Prescription given to patient, medication list explained in detail. Pt verbalized understanding.  Allergies as of 11/22/2018      Reactions   Isosorbide    Lisinopril    Metformin And Related       Medication List    TAKE these medications   allopurinol 300 MG tablet Commonly known as: ZYLOPRIM Take 300 mg by mouth daily.   aspirin EC 81 MG tablet Take 81 mg by mouth daily.   carvedilol 12.5 MG tablet Commonly known as: COREG Take 12.5 mg by mouth 2 (two) times daily. Notes to patient: Take 2nd dose this afternoon   cholecalciferol 25 MCG (1000 UT) tablet Commonly known as: VITAMIN D Take 2,000 Units by mouth daily.   Coenzyme Q10 100 MG capsule Take 100 mg by mouth daily.   Fish Oil Odor-Less 1200 MG Caps Take 3,600 mg by mouth at bedtime.   furosemide 40 MG tablet Commonly known as: Lasix Take 0.5 tablets (20 mg total) by mouth daily as needed (leg swelling or weight gain of 3lb or more).   glipiZIDE 10 MG tablet Commonly known as: GLUCOTROL Take 10 mg by mouth 2 (two) times daily before a meal. Notes to patient: Take 2nd dose this afternoon   levothyroxine 50 MCG tablet Commonly known as: SYNTHROID Take 50 mcg by mouth daily before breakfast.   losartan 50 MG tablet Commonly known as: COZAAR Take 50 mg by mouth daily.   MENS PROSTATE HEALTH FORMULA PO Take 1 tablet by mouth daily.   multivitamins ther. w/minerals Tabs tablet Take 1 tablet by mouth daily.   Onglyza 5 MG Tabs tablet Generic drug: saxagliptin HCl Take 5 mg by mouth daily.   pravastatin 80 MG tablet Commonly known as: PRAVACHOL Take 80 mg by mouth at bedtime.   Singulair 10 MG tablet Generic drug: montelukast Take 10 mg by mouth at bedtime.       Vitals:   11/22/18 0800 11/22/18 1051  BP: 119/73 (!) 150/80  Pulse: (!) 58 (!) 59  Resp: 18   Temp: 97.8 F (36.6 C)    SpO2: 96%     Skin clean, dry and intact without evidence of skin break down, no evidence of skin tears noted. IV catheter discontinued intact. Site without signs and symptoms of complications. Dressing and pressure applied. Pt denies pain at this time. No complaints noted.  An After Visit Summary was printed and given to the patient. Patient escorted via [place], and D/C home via private auto.  [place]

## 2018-11-22 NOTE — Progress Notes (Signed)
   Progress Note   Subjective   Doing well today, the patient denies CP or SOB.  No new concerns  Inpatient Medications    Scheduled Meds: . allopurinol  300 mg Oral Daily  . aspirin EC  81 mg Oral Daily  . carvedilol  12.5 mg Oral BID  . cholecalciferol  2,000 Units Oral Daily  . enoxaparin (LOVENOX) injection  40 mg Subcutaneous Q24H  . furosemide  40 mg Intravenous BID  . glipiZIDE  10 mg Oral BID AC  . levothyroxine  50 mcg Oral Q0600  . linagliptin  5 mg Oral Daily  . losartan  50 mg Oral Daily  . montelukast  10 mg Oral QHS  . multivitamin with minerals  1 tablet Oral Daily  . potassium chloride  20 mEq Oral BID  . pravastatin  80 mg Oral QHS   Continuous Infusions:  PRN Meds: acetaminophen **OR** acetaminophen, morphine injection, nitroGLYCERIN, ondansetron **OR** ondansetron (ZOFRAN) IV   Vital Signs    Vitals:   11/22/18 0420 11/22/18 0500 11/22/18 0800 11/22/18 1051  BP: 108/72  119/73 (!) 150/80  Pulse: 65  (!) 58 (!) 59  Resp: 16  18   Temp: 98.4 F (36.9 C)  97.8 F (36.6 C)   TempSrc: Oral  Oral   SpO2: 94%  96%   Weight:  100.6 kg    Height:        Intake/Output Summary (Last 24 hours) at 11/22/2018 1228 Last data filed at 11/22/2018 [id number] Gross per 24 hour  Intake -  Output 1825 ml  Net -1825 ml   Filed Weights   11/21/18 1333 11/21/18 1718 11/22/18 0500  Weight: 104.3 kg 102 kg 100.6 kg    Telemetry    sinus - Personally Reviewed  Physical Exam   GEN- The patient is well appearing, alert and oriented x 3 today.   Head- normocephalic, atraumatic Eyes-  Sclera clear, conjunctiva pink Ears- hearing reduced (hearing aid in place) Oropharynx- clear Neck- supple, Lungs-  normal work of breathing Heart- Regular rate and rhythm  GI- soft, NT, ND, + BS Extremities- no clubbing, cyanosis, or edema  MS- no significant deformity or atrophy Skin- no rash or lesion Psych- euthymic mood, full affect Neuro- strength and sensation are intact    Labs    Chemistry Recent Labs  Lab 11/21/18 1335 11/22/18 0645  NA 138 142  K 3.8 4.6  CL 109 105  CO2 21* 27  GLUCOSE 143* 168*  BUN 13 18  CREATININE 0.81 1.22  CALCIUM 9.0 9.2  GFRNONAA >60 58*  GFRAA >60 >60  ANIONGAP 8 10     Hematology Recent Labs  Lab 11/21/18 1335 11/22/18 0645  WBC 8.2 10.1  RBC 4.74 4.97  HGB 14.7 15.5  HCT 42.2 44.9  MCV 89.0 90.3  MCH 31.0 31.2  MCHC 34.8 34.5  RDW 13.4 13.7  PLT 256 272    myoview and echo reviewed  Assessment & Plan    1.  CAD Known CAD s/p prior CABG Low risk myoview this admission Currently pain free Continue outpatient medical management He prefers to follow-up with Dr Saralyn Pilar at discharge I have advised he and his wife to call Dr Saralyn Pilar office at discharge  2. Acute diastolic dysfunction Improved with gentle diuresis Discharge on low dose lasix Sodium restriction  Cardiology team to see as needed while here. Please call with questions.   Thompson Grayer MD, [facility] 11/22/2018 12:28 PM

## 2018-11-22 NOTE — Discharge Summary (Signed)
[facility] NAME: Shaul Larmon    MR#:  [id number]  DATE OF BIRTH:  Mar 16, 1945  DATE OF ADMISSION:  11/21/2018   ADMITTING PHYSICIAN: Henreitta Leber, MD  DATE OF DISCHARGE: 11/22/18  PRIMARY CARE PHYSICIAN: Sherrin Daisy, MD   ADMISSION DIAGNOSIS:  Chest pressure [R07.89] DISCHARGE DIAGNOSIS:  Active Problems:   Chest pain  SECONDARY DIAGNOSIS:   Past Medical History:  Diagnosis Date  . Arthritis   . Coronary artery disease   . Degenerative disc disease, lumbar   . Depression   . Diabetes mellitus without complication ([place])   . GERD (gastroesophageal reflux disease)   . Gout   . Hx of CABG   . Hypercholesteremia   . Hypertension   . PLMD (periodic limb movement disorder)   . Sleep apnea   . Thyroid disease    HOSPITAL COURSE:   Sofia is a 73 year old male who presented to the ED with chest pain/pressure and shortness of breath that started when he was working out in the yard.  In the ED, troponin was negative and EKG was unremarkable.  He was admitted for further management.  Chest pain/pressure- with a history of CAD s/p CABG. May be musculoskeletal in etiology.  Chest pain resolved on the day of discharge. -High-sensitivity troponin was negative x 2 -ECHO with EF 60-65% and impaired relaxation -Lexiscan Myoview performed 9/12 without any signs of ischemia -Seen by cardiology during this hospitalization -Home aspirin, nitroglycerin, beta-blocker, losartan, and statin were continued -Patient requested to follow-up with Dr. Saralyn Pilar [facility]) as an outpatient  Acute diastolic heart failure- new diagnosis. -ECHO with impaired relaxation -Continued home ARB and beta-blocker -Received a couple of doses of IV Lasix, with good response -Discharged home on Lasix 20 mg p.o. as needed for lower extremity edema or weight gain > 3 lb  Hypertension-stable -Home BP meds were continued  History of gout -Home  allopurinol was continued  Type 2 diabetes -Home diabetes medicines were continued  Hypothyroidism-stable -Home Synthroid was continued  Hyperlipidemia-stable -Home pravastatin was continued  DISCHARGE CONDITIONS:  CAD s/p CABG Acute diastolic heart failure Hypertension History of gout Type 2 diabetes Hypothyroidism Hyperlipidemia CONSULTS OBTAINED:  Treatment Team:  Minna Merritts, MD DRUG ALLERGIES:   Allergies  Allergen Reactions  . Isosorbide   . Lisinopril   . Metformin And Related    DISCHARGE MEDICATIONS:   Allergies as of 11/22/2018      Reactions   Isosorbide    Lisinopril    Metformin And Related       Medication List    TAKE these medications   allopurinol 300 MG tablet Commonly known as: ZYLOPRIM Take 300 mg by mouth daily.   aspirin EC 81 MG tablet Take 81 mg by mouth daily.   carvedilol 12.5 MG tablet Commonly known as: COREG Take 12.5 mg by mouth 2 (two) times daily.   cholecalciferol 25 MCG (1000 UT) tablet Commonly known as: VITAMIN D Take 2,000 Units by mouth daily.   Coenzyme Q10 100 MG capsule Take 100 mg by mouth daily.   Fish Oil Odor-Less 1200 MG Caps Take 3,600 mg by mouth at bedtime.   furosemide 40 MG tablet Commonly known as: Lasix Take 0.5 tablets (20 mg total) by mouth daily as needed (leg swelling or weight gain of 3lb or more).   glipiZIDE 10 MG tablet Commonly known as: GLUCOTROL Take 10 mg by mouth 2 (two) times daily before  a meal.   levothyroxine 50 MCG tablet Commonly known as: SYNTHROID Take 50 mcg by mouth daily before breakfast.   losartan 50 MG tablet Commonly known as: COZAAR Take 50 mg by mouth daily.   MENS PROSTATE HEALTH FORMULA PO Take 1 tablet by mouth daily.   multivitamins ther. w/minerals Tabs tablet Take 1 tablet by mouth daily.   Onglyza 5 MG Tabs tablet Generic drug: saxagliptin HCl Take 5 mg by mouth daily.   pravastatin 80 MG tablet Commonly known as: PRAVACHOL Take 80  mg by mouth at bedtime.   Singulair 10 MG tablet Generic drug: montelukast Take 10 mg by mouth at bedtime.        DISCHARGE INSTRUCTIONS:  1.  Follow-up with PCP in 5 days 2.  Follow-up with cardiology in 1-2 weeks 3.  Take Lasix 20 mg p.o. as needed for lower extremity edema or weight gain > 3lb DIET:  Cardiac diet and Diabetic diet DISCHARGE CONDITION:  Stable ACTIVITY:  Activity as tolerated OXYGEN:  Home Oxygen: No.  Oxygen Delivery: room air DISCHARGE LOCATION:  home   If you experience worsening of your admission symptoms, develop shortness of breath, life threatening emergency, suicidal or homicidal thoughts you must seek medical attention immediately by calling 911 or calling your MD immediately  if symptoms less severe.  You Must read complete instructions/literature along with all the possible adverse reactions/side effects for all the Medicines you take and that have been prescribed to you. Take any new Medicines after you have completely understood and accpet all the possible adverse reactions/side effects.   Please note  You were cared for by a hospitalist during your hospital stay. If you have any questions about your discharge medications or the care you received while you were in the hospital after you are discharged, you can call the unit and asked to speak with the hospitalist on call if the hospitalist that took care of you is not available. Once you are discharged, your primary care physician will handle any further medical issues. Please note that NO REFILLS for any discharge medications will be authorized once you are discharged, as it is imperative that you return to your primary care physician (or establish a relationship with a primary care physician if you do not have one) for your aftercare needs so that they can reassess your need for medications and monitor your lab values.    On the day of Discharge:  VITAL SIGNS:  Blood pressure (!) 150/80, pulse  (!) 59, temperature 97.8 F (36.6 C), temperature source Oral, resp. rate 18, height 5\' 9"  (1.753 m), weight 100.6 kg, SpO2 96 %. PHYSICAL EXAMINATION:  GENERAL:  73 y.o.-year-old patient lying in the bed with no acute distress.  Well-appearing. EYES: Pupils equal, round, reactive to light and accommodation. No scleral icterus. Extraocular muscles intact.  HEENT: Head atraumatic, normocephalic. Oropharynx and nasopharynx clear.  NECK:  Supple, no jugular venous distention. No thyroid enlargement, no tenderness.  LUNGS: Normal breath sounds bilaterally, no wheezing, rales,rhonchi or crepitation. No use of accessory muscles of respiration.  CARDIOVASCULAR: RRR, S1, S2 normal. No murmurs, rubs, or gallops.  ABDOMEN: Soft, non-tender, non-distended. Bowel sounds present. No organomegaly or mass.  EXTREMITIES: No pedal edema, cyanosis, or clubbing.  NEUROLOGIC: Cranial nerves II through XII are intact. Muscle strength 5/5 in all extremities. Sensation intact. Gait not checked.  PSYCHIATRIC: The patient is alert and oriented x 3.  SKIN: No obvious rash, lesion, or ulcer.  DATA REVIEW:  CBC Recent Labs  Lab 11/22/18 0645  WBC 10.1  HGB 15.5  HCT 44.9  PLT 272    Chemistries  Recent Labs  Lab 11/22/18 0645  NA 142  K 4.6  CL 105  CO2 27  GLUCOSE 168*  BUN 18  CREATININE 1.22  CALCIUM 9.2     Microbiology Results  Results for orders placed or performed during the hospital encounter of 11/21/18  SARS Coronavirus 2 [facility] order, Performed in [facility] hospital lab) Nasopharyngeal Nasopharyngeal Swab     Status: None   Collection Time: 11/21/18  3:12 PM   Specimen: Nasopharyngeal Swab  Result Value Ref Range Status   SARS Coronavirus 2 NEGATIVE NEGATIVE Final    Comment: (NOTE) If result is NEGATIVE SARS-CoV-2 target nucleic acids are NOT DETECTED. The SARS-CoV-2 RNA is generally detectable in upper and lower  respiratory specimens during the acute phase of infection.  The lowest  concentration of SARS-CoV-2 viral copies this assay can detect is 250  copies / mL. A negative result does not preclude SARS-CoV-2 infection  and should not be used as the sole basis for treatment or other  patient management decisions.  A negative result may occur with  improper specimen collection / handling, submission of specimen other  than nasopharyngeal swab, presence of viral mutation(s) within the  areas targeted by this assay, and inadequate number of viral copies  (<250 copies / mL). A negative result must be combined with clinical  observations, patient history, and epidemiological information. If result is POSITIVE SARS-CoV-2 target nucleic acids are DETECTED. The SARS-CoV-2 RNA is generally detectable in upper and lower  respiratory specimens dur ing the acute phase of infection.  Positive  results are indicative of active infection with SARS-CoV-2.  Clinical  correlation with patient history and other diagnostic information is  necessary to determine patient infection status.  Positive results do  not rule out bacterial infection or co-infection with other viruses. If result is PRESUMPTIVE POSTIVE SARS-CoV-2 nucleic acids MAY BE PRESENT.   A presumptive positive result was obtained on the submitted specimen  and confirmed on repeat testing.  While 2019 novel coronavirus  (SARS-CoV-2) nucleic acids may be present in the submitted sample  additional confirmatory testing may be necessary for epidemiological  and / or clinical management purposes  to differentiate between  SARS-CoV-2 and other Sarbecovirus currently known to infect humans.  If clinically indicated additional testing with an alternate test  methodology [phone]) is advised. The SARS-CoV-2 RNA is generally  detectable in upper and lower respiratory sp ecimens during the acute  phase of infection. The expected result is Negative. Fact Sheet for Patients:  StrictlyIdeas.no  Fact Sheet for Healthcare Providers: BankingDealers.co.za This test is not yet approved or cleared by the [place] FDA and has been authorized for detection and/or diagnosis of SARS-CoV-2 by FDA under an Emergency Use Authorization (EUA).  This EUA will remain in effect (meaning this test can be used) for the duration of the COVID-19 declaration under Section 564(b)(1) of the Act, 21 U.S.C. section 360bbb-3(b)(1), unless the authorization is terminated or revoked sooner. Performed at [facility], [address] [place], [place] [address]     RADIOLOGY:  Nm Myocar Multi W/spect Tamela Oddi Motion / Ef  Result Date: 11/22/2018 Pharmacological myocardial perfusion imaging study with no significant  ischemia Normal wall motion, EF estimated at 63% No EKG changes concerning for ischemia at peak stress or in recovery. CT attenuation corrected images with significant three vessel  coronary artery calcification and moderate aortic atherosclerosis. Low risk scan Signed, Esmond Plants, MD, Ph.D [facility]   Dg Chest Portable 1 View  Result Date: 11/21/2018 CLINICAL DATA:  Chest pain EXAM: PORTABLE CHEST 1 VIEW COMPARISON:  06/04/2017 FINDINGS: Cardiac shadow is stable. Postoperative changes are again seen. The lungs are well aerated bilaterally. No focal infiltrate or sizable effusion is seen. No bony abnormality is noted. IMPRESSION: No acute abnormality seen. Electronically Signed   By: Inez Catalina M.D.   On: 11/21/2018 14:37     Management plans discussed with the patient, family and they are in agreement.  CODE STATUS: Full Code   TOTAL TIME TAKING CARE OF THIS PATIENT: 40 minutes.    Berna Spare Advaith Lamarque M.D on 11/22/2018 at 12:10 PM  Between 7am to 6pm - Pager - [phone]  After 6pm go to [email or web address] - Proofreader  [facility]  Office  [phone]  CC: Primary care physician; Sherrin Daisy, MD   Note: This  dictation was prepared with Dragon dictation along with smaller phrase technology. Any transcriptional errors that result from this process are unintentional.

## 2018-11-22 NOTE — Plan of Care (Signed)
  Problem: Activity: Goal: Risk for activity intolerance will decrease Outcome: Progressing   

## 2018-11-27 DIAGNOSIS — H9190 Unspecified hearing loss, unspecified ear: Secondary | ICD-10-CM | POA: Insufficient documentation

## 2018-12-01 DIAGNOSIS — Z09 Encounter for follow-up examination after completed treatment for conditions other than malignant neoplasm: Secondary | ICD-10-CM | POA: Insufficient documentation

## 2018-12-01 DIAGNOSIS — I5032 Chronic diastolic (congestive) heart failure: Secondary | ICD-10-CM | POA: Insufficient documentation

## 2018-12-17 ENCOUNTER — Telehealth: Payer: Self-pay | Admitting: *Deleted

## 2018-12-17 NOTE — Telephone Encounter (Signed)
Attempted to call to complete New Patient Assessment form. Message left.

## 2018-12-18 ENCOUNTER — Other Ambulatory Visit: Payer: Self-pay

## 2018-12-18 ENCOUNTER — Encounter: Payer: Self-pay | Admitting: Student in an Organized Health Care Education/Training Program

## 2018-12-18 ENCOUNTER — Ambulatory Visit
Payer: Medicare Other | Attending: Student in an Organized Health Care Education/Training Program | Admitting: Student in an Organized Health Care Education/Training Program

## 2018-12-18 VITALS — BP 159/85 | HR 63 | Temp 98.3°F | Resp 16 | Ht 69.0 in | Wt 231.0 lb

## 2018-12-18 DIAGNOSIS — M5136 Other intervertebral disc degeneration, lumbar region: Secondary | ICD-10-CM | POA: Insufficient documentation

## 2018-12-18 DIAGNOSIS — M4807 Spinal stenosis, lumbosacral region: Secondary | ICD-10-CM | POA: Insufficient documentation

## 2018-12-18 DIAGNOSIS — G8929 Other chronic pain: Secondary | ICD-10-CM | POA: Diagnosis present

## 2018-12-18 DIAGNOSIS — G894 Chronic pain syndrome: Secondary | ICD-10-CM | POA: Diagnosis present

## 2018-12-18 DIAGNOSIS — M51369 Other intervertebral disc degeneration, lumbar region without mention of lumbar back pain or lower extremity pain: Secondary | ICD-10-CM | POA: Insufficient documentation

## 2018-12-18 DIAGNOSIS — M48062 Spinal stenosis, lumbar region with neurogenic claudication: Secondary | ICD-10-CM | POA: Diagnosis not present

## 2018-12-18 DIAGNOSIS — Z981 Arthrodesis status: Secondary | ICD-10-CM | POA: Diagnosis present

## 2018-12-18 DIAGNOSIS — M5416 Radiculopathy, lumbar region: Secondary | ICD-10-CM | POA: Insufficient documentation

## 2018-12-18 MED ORDER — GABAPENTIN 300 MG PO CAPS: ORAL_CAPSULE | ORAL | 0 refills | Status: DC

## 2018-12-18 MED ORDER — PREDNISONE 20 MG PO TABS: ORAL_TABLET | ORAL | 0 refills | Status: AC

## 2018-12-18 NOTE — Progress Notes (Signed)
Safety precautions to be maintained throughout the outpatient stay will include: orient to surroundings, keep bed in low position, maintain call bell within reach at all times, provide assistance with transfer out of bed and ambulation.  

## 2018-12-18 NOTE — Progress Notes (Signed)
Patient's Name: Tim Miranda  MRN: [id number]  Referring Provider: [facility]  DOB: Aug 27, 1945  PCP: Derinda Late, MD  DOS: 12/18/2018  Note by: Gillis Santa, MD  Service setting: Ambulatory outpatient  Specialty: Interventional Pain Management  Location: [facility] (AMB) Pain Management Facility  Visit type: Initial Patient Evaluation  Patient type: New Patient   Primary Reason(s) for Visit: Encounter for initial evaluation of one or more chronic problems (new to examiner) potentially causing chronic pain, and posing a threat to normal musculoskeletal function. (Level of risk: High) CC: Back Pain (lower)  HPI  [name] is a 73 y.o. year old, male patient, who comes today to see [place] for the first time for an initial evaluation of his chronic pain. 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 Today he comes in for evaluation of his Back Pain (lower)  Pain Assessment: Location: Lower Back Radiating: down front of right leg to foot, not including toes Onset: More than a month ago Duration: Chronic pain Quality: Sharp, Burning Severity: 5 /10 (subjective, self-reported pain score)  Note: Reported level is compatible with observation.                         When using our objective Pain Scale, levels between 6 and 10/10 are said to belong in an emergency room, as it progressively worsens from a 6/10, described as severely limiting, requiring emergency care not usually available at an outpatient pain management facility. At a 6/10 level, communication becomes difficult and requires great effort. Assistance to reach the emergency department may be required. Facial flushing and profuse sweating along with potentially dangerous increases  in heart rate and blood pressure will be evident. Effect on ADL: difficulty sleeping, cannot get up from floor, cannot pull self up from floor Timing: Constant Modifying factors: Aleve helps ease pain BP: (!) 159/85  HR: 63  Onset and Duration: Date of injury: 2017 Cause of pain: fell down steps Severity: Getting worse, NAS-11 at its worse: 10-/10, NAS-11 at its best: 5/10, NAS-11 now: 5/10 and NAS-11 on the average: 5/10 Timing: Afternoon, Night, During activity or exercise and After activity or exercise Aggravating Factors: Bending, Kneeling, Lifiting, Twisting, Walking, Walking uphill, Walking downhill and Working Alleviating Factors: Cold packs, Hot packs, Resting, Sitting and Warm showers or baths Associated Problems: Depression, Erectile dysfunction, Fatigue, Impotence, Spasms, Weakness and Pain that does not allow patient to sleep Quality of Pain: Aching, Agonizing, Annoying, Burning, Constant, Dreadful, Dull, Sharp, Shooting, Stabbing, Tender, Throbbing, Tingling, Toothache-like and Uncomfortable Previous Examinations or Tests: MRI scan Previous Treatments: Chiropractic manipulations, Epidural steroid injections, Narcotic medications, Physical Therapy, Relaxation therapy, Steroid treatments by mouth, Strengthening exercises and Stretching exercises  The patient comes into the clinics today for the first time for a chronic pain management evaluation.  Patient is a very pleasant 72 year old [place] veteran who presents with a chief complaint of low back pain with radiation down his right anterolateral thigh to his lateral calf.  This pain started in 1997, 1998.  In 1998 he had minimally invasive lumbar spine surgery which sounds like a lumbar laminectomy.  Then in 2017, patient had return of his low back pain and ended up seeing Dr. Aris Lot at [facility] and had an L3-L4 TLIF performed.  He states that since then he is been having significant pain in his right axial low back with radiation to his  right leg.  He was scheduled to have a revision surgery in approximately April but due to [place], that has been postponed.  He was also supposed to have injections done at the [place] but due to [place], has been lost to follow-up and is very frustrated that he has not had injections performed.  Patient has not had an MRI done since his L3-L4 TLIF in 2017.  He is having weakness in his right leg and having difficulty walking.  He denies any bowel or bladder dysfunction.  He states that he does not feel confident bearing weight on his right leg due to weakness.   Meds   Current Outpatient Medications:  .  allopurinol (ZYLOPRIM) 300 MG tablet, Take 300 mg by mouth daily., Disp: , Rfl:  .  aspirin EC 81 MG tablet, Take 81 mg by mouth daily., Disp: , Rfl:  .  carvedilol (COREG) 12.5 MG tablet, Take 12.5 mg by mouth 2 (two) times daily. , Disp: , Rfl:  .  cholecalciferol (VITAMIN D) 25 MCG (1000 UT) tablet, Take 2,000 Units by mouth daily. , Disp: , Rfl:  .  Coenzyme Q10 100 MG capsule, Take 100 mg by mouth daily. , Disp: , Rfl:  .  glipiZIDE (GLUCOTROL) 10 MG tablet, Take 10 mg by mouth 2 (two) times daily before a meal., Disp: , Rfl:  .  levothyroxine (SYNTHROID, LEVOTHROID) 50 MCG tablet, Take 50 mcg by mouth daily before breakfast., Disp: , Rfl:  .  losartan (COZAAR) 50 MG tablet, Take 50 mg by mouth daily. , Disp: , Rfl:  .  Misc Natural Products (MENS PROSTATE HEALTH FORMULA PO), Take 1 tablet by mouth daily., Disp: , Rfl:  .  montelukast (SINGULAIR) 10 MG tablet, Take 10 mg by mouth at bedtime., Disp: , Rfl:  .  Multiple Vitamins-Minerals (MULTIVITAMINS THER. W/MINERALS) TABS tablet, Take 1 tablet by mouth daily., Disp: , Rfl:  .  naproxen sodium (ALEVE) 220 MG tablet, Take 220 mg by mouth., Disp: , Rfl:  .  Omega-3 Fatty Acids (FISH OIL ODOR-LESS) 1200 MG CAPS, Take 3,600 mg by mouth at bedtime., Disp: , Rfl:  .  ONGLYZA 5 MG TABS tablet, Take 5 mg by mouth daily., Disp: , Rfl:  .  pravastatin  (PRAVACHOL) 80 MG tablet, Take 80 mg by mouth at bedtime. , Disp: , Rfl:  .  furosemide (LASIX) 40 MG tablet, Take 0.5 tablets (20 mg total) by mouth daily as needed (leg swelling or weight gain of 3lb or more). (Patient not taking: Reported on 12/18/2018), Disp: 30 tablet, Rfl: 0 .  gabapentin (NEURONTIN) 300 MG capsule, Take 1 capsule (300 mg total) by mouth at bedtime for 21 days, THEN 1 capsule (300 mg total) 2 (two) times daily., Disp: 99 capsule, Rfl: 0 .  predniSONE (DELTASONE) 20 MG tablet, Take 3 tablets (60 mg total) by mouth daily with breakfast for 3 days, THEN 2 tablets (40 mg total) daily with breakfast for 3 days, THEN 1 tablet (20 mg total) daily with breakfast for 3 days., Disp: 18 tablet, Rfl: 0  Imaging Review  [place] by Lumbar MR w/wo contrast:  Results for orders placed during the hospital encounter of 12/22/15  MR Lumbar Spine W Wo Contrast   Narrative CLINICAL DATA:  73 year old male with prior lumbar surgery, most recently in February this year. Right side lumbar back pain  radiating to the medial right leg and knee. Initial encounter.  EXAM: MRI LUMBAR SPINE WITHOUT AND WITH CONTRAST  TECHNIQUE: Multiplanar and multiecho pulse sequences of the lumbar spine were obtained without and with intravenous contrast.  CONTRAST:  38mL MULTIHANCE GADOBENATE DIMEGLUMINE 529 MG/ML IV SOLN  COMPARISON:  None.  FINDINGS: Segmentation: Lumbar segmentation appears to be normal and will be designated as such for this report.  Alignment: Mild retrolisthesis of L5 on S1. Mild straightening of lumbar lordosis. Slight levoconvex lumbar scoliosis.  Vertebrae: No marrow edema or evidence of acute osseous abnormality.  Conus medullaris: Extends to the L1-L2 level and appears normal. No abnormal intradural enhancement.  Paraspinal and other soft tissues: Simple appearing medial right renal cyst. Otherwise negative visualized abdominal viscera.  Mild postoperative changes to the  posterior paraspinal soft tissues most apparent at L2 and L3. No postoperative fluid collection identified.  Disc levels:  T11-T12:  Mild facet hypertrophy.  T12-L1:  Negative.  L1-L2: Mild mostly far lateral disc bulging. No significant stenosis.  L2-L3: Circumferential disc bulge with broad-based posterior component. Mild to moderate facet and ligament flavum hypertrophy greater on the right. Trace facet joint fluid. Mild epidural lipomatosis.  Mild to moderate spinal stenosis (series 6, image 10). Mild right L2 foraminal stenosis.  L3-L4: Circumferential disc bulge with superimposed right subarticular and foraminal cephalad disc extrusion. See series 3, images 5 and 6 and series 6 images 15 through 17. The extruded disc is estimated at 10-11 mm in size. Severe associated right L3 foraminal stenosis. Superimposed postoperative changes on the right including partial right laminectomy. The subarticular disc is also affecting the right lateral recess, with up to moderate stenosis at the level of the descending right L4 nerve roots. No spinal stenosis. No left foraminal stenosis.  L4-L5: Vacuum disc. Left eccentric circumferential disc bulge with broad-based posterior component. Mild to moderate facet and ligament flavum hypertrophy. Evidence of prior surgery to the right lateral recess with no adverse features. Mild to moderate left lateral recess stenosis. Endplate spurring. Mild to moderate bilateral L4 foraminal stenosis.  L5-S1: Circumferential disc bulge. Broad-based posterior component. Mild to moderate facet hypertrophy greater on the left. No spinal or lateral recess stenosis. Endplate spurring. Moderate left and mild to moderate right L5 foraminal stenosis.  IMPRESSION: 1. Symptomatic level appears to be L3-L4 where a right subarticular and foraminal disc extrusion severely affects the exiting right L3 nerve and results in up to moderate stenosis at the right  lateral recess. Query right L3 and/or L4 radiculitis. 2. Previous postoperative changes on the right at L3-L4. Postoperative changes also suspected on the right at L4-L5. 3. Multifactorial mild to moderate spinal stenosis at L2-L3. Multifactorial left lateral recess stenosis at L4-L5. Up to moderate multifactorial neural foraminal stenosis at the bilateral L4 and L5 nerve levels.   Electronically Signed   By: Genevie Ann M.D.   On: 12/22/2015 16:23     Lumbar CT wo contrast:  Results for orders placed during the hospital encounter of 01/02/16  CT LUMBAR SPINE WO CONTRAST   Narrative CLINICAL DATA:  Low back pain. Surgery favor 10/2015. Lumbar radiculopathy.  EXAM: CT LUMBAR SPINE WITHOUT CONTRAST  TECHNIQUE: Multidetector CT imaging of the lumbar spine was performed without intravenous contrast administration. Multiplanar CT image reconstructions were also generated.  COMPARISON:  MRI of the lumbar spine 12/22/2015  FINDINGS: Segmentation: 5 non rib-bearing lumbar type vertebral bodies are present.  Alignment: Slight anterolisthesis is present at L4-5. Levoconvex scoliosis is centered at  L3-4.  Vertebrae: Asymmetric endplate changes are present on the right at L4-5. Vertebral body heights are maintained throughout.  Paraspinal and other soft tissues: An exophytic low-density lesion at the upper pole the right kidney measures 3.2 cm, likely a simple cyst. Atherosclerotic calcifications are present in the aorta without aneurysm. There is no significant adenopathy.  Disc levels: T12-L1:  Negative.  L1-2: Mild disc bulging is present without significant stenosis or change.  L2-3: A broad-based disc protrusion is asymmetric to the left. There is mild subarticular narrowing bilaterally, left greater than right. Mild left foraminal narrowing is again seen.  L3-4: A a prominent disc extrusion extends into the right neural foramen with severe right foraminal stenosis. This  impacts the right subarticular space is well. Mild left subarticular narrowing is present. Mild left foraminal narrowing is present as well.  L4-5: A broad-based disc protrusion is present. Facet hypertrophy is slightly worse on the right. The disc protrusion is asymmetric to the left. This results in moderate subarticular and foraminal narrowing bilaterally.  L5-S1: A broad-based disc protrusion is present. Mild facet hypertrophy is noted bilaterally. This results in mild bilateral foraminal narrowing, left greater than right.  IMPRESSION: 1. Severe right foraminal stenosis at L3-4 secondary to a a far right lateral disc extrusion. 2. Moderate right subarticular narrowing at L3-4. 3. Mild left subarticular and foraminal stenosis at L3-4. 4. Moderate subarticular and foraminal stenosis bilaterally at L4-5. 5. Levoconvex scoliosis of the lumbar spine is centered at L3-4. 6. Mild subarticular narrowing at L2-3 is worse on the left. Mild left foraminal stenosis is also present at L2-3. 7. Mild bilateral foraminal narrowing at L5-S1 is slightly worse on the left.   Electronically Signed   By: San Morelle M.D.   On: 01/02/2016 10:44    Complexity Note: Imaging results reviewed. Results shared with Mr. Paolini, using Layman's terms.                         ROS  Cardiovascular: Heart trouble, Daily Aspirin intake, High blood pressure, Chest pain, Heart surgery and Blood thinners:  Anticoagulant Pulmonary or Respiratory: Wheezing and difficulty taking a deep full breath (Asthma), Shortness of breath, Snoring  and Temporary stoppage of breathing during sleep Neurological: No reported neurological signs or symptoms such as seizures, abnormal skin sensations, urinary and/or fecal incontinence, being born with an abnormal open spine and/or a tethered spinal cord Review of Past Neurological Studies:  Results for orders placed or performed during the hospital encounter of 07/08/16  CT  Head Wo Contrast   Narrative   CLINICAL DATA:  Shortness of breath and chest tightness  EXAM: CT HEAD WITHOUT CONTRAST  TECHNIQUE: Contiguous axial images were obtained from the base of the skull through the vertex without intravenous contrast.  COMPARISON:  None.  FINDINGS: Brain: No evidence of acute infarction, hemorrhage, hydrocephalus, extra-axial collection or mass lesion/mass effect.  Vascular: Carotid artery calcifications.  No hyperdense vessels.  Skull: No fracture or suspicious bone lesion  Sinuses/Orbits: Mucosal thickening in the maxillary, ethmoid, and sphenoid sinuses. Underpneumatized frontal sinuses. No acute orbital abnormality.  Other: None  IMPRESSION: No CT evidence for acute intracranial abnormality.   Electronically Signed   By: Donavan Foil M.D.   On: 07/08/2016 20:00    Psychological-Psychiatric: Depressed Gastrointestinal: Reflux or heatburn and Inflamed liver (Hepatitis) Genitourinary: No reported renal or genitourinary signs or symptoms such as difficulty voiding or producing urine, peeing blood, non-functioning kidney, kidney stones, difficulty  emptying the bladder, difficulty controlling the flow of urine, or chronic kidney disease Hematological: No reported hematological signs or symptoms such as prolonged bleeding, low or poor functioning platelets, bruising or bleeding easily, hereditary bleeding problems, low energy levels due to low hemoglobin or being anemic Endocrine: 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 Figura is allergic to isosorbide; lisinopril;  and metformin and related.  Laboratory Chemistry Profile   Screening Lab Results  Component Value Date   SARSCOV2NAA NEGATIVE 11/21/2018    Inflammation Renal Lab Results  Component Value Date   BUN 18 11/22/2018   CREATININE 1.22 11/22/2018   GFRAA >60 11/22/2018   GFRNONAA 58 (L) 11/22/2018                             Hepatic Lab Results  Component Value Date   AST 33 07/08/2016   ALT 22 07/08/2016   ALBUMIN 4.2 07/08/2016   ALKPHOS 45 07/08/2016                        Electrolytes Lab Results  Component Value Date   NA 142 11/22/2018   K 4.6 11/22/2018   CL 105 11/22/2018   CALCIUM 9.2 11/22/2018                        Neuropathy Lab Results  Component Value Date   HGBA1C 7.9 (H) 11/21/2018                         Coagulation Lab Results  Component Value Date   PLT 272 11/22/2018                        Cardiovascular Lab Results  Component Value Date   BNP 93.0 11/21/2018   TROPONINI <0.03 06/05/2017   HGB 15.5 11/22/2018   HCT 44.9 11/22/2018                         ID Lab Results  Component Value Date   SARSCOV2NAA NEGATIVE 11/21/2018    Cancer No results found for: CEA, CA125, LABCA2                      Endocrine No results found for: TSH, FREET4, TESTOFREE, TESTOSTERONE, SHBG, ESTRADIOL, ESTRADIOLPCT, ESTRADIOLFRE, LABPREG, ACTH                      Note: Lab results reviewed.  PFSH  Drug: Mr. Bator  reports no history of drug use. Alcohol:  reports no history of alcohol use. Tobacco:  reports that he has quit smoking. His smoking use included cigarettes. He has a 0.75 pack-year smoking history. He has never used smokeless tobacco. Medical:  has a past medical history of Arthritis, Coronary artery disease, Degenerative disc disease, lumbar, Depression, Diabetes mellitus without complication ([place]), GERD (gastroesophageal reflux disease), Gout, CABG, Hypercholesteremia, Hypertension, PLMD (periodic limb movement disorder), Sleep  apnea, and Thyroid disease. Family: family history includes Heart disease in his father and mother.  Past Surgical History:  Procedure Laterality Date  . BACK SURGERY    . CARPAL TUNNEL RELEASE    . COLONOSCOPY WITH PROPOFOL N/A 09/19/2016   Procedure: COLONOSCOPY WITH PROPOFOL;  Surgeon: Manya Silvas, MD;  [facility] ENDOSCOPY;  Service: Endoscopy;  Laterality: N/A;  . CORONARY ARTERY BYPASS GRAFT    . ESOPHAGOGASTRODUODENOSCOPY (EGD) WITH PROPOFOL N/A 09/19/2016   Procedure: ESOPHAGOGASTRODUODENOSCOPY (EGD) WITH PROPOFOL;  Surgeon: Manya Silvas, MD;  Location: [facility] ENDOSCOPY;  Service: Endoscopy;  Laterality: N/A;  . EYE SURGERY    . SHOULDER SURGERY     Active Ambulatory Problems    Diagnosis Date Noted  . Chest pain 11/21/2018  . History of lumbar fusion (L3-L4 TLIF Nov 2017) 12/18/2018  . 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 Lumbar radiculopathy 12/18/2018  . Spinal stenosis, lumbar region, 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  . Lumbar degenerative disc disease 12/18/2018  . Chronic pain syndrome 12/18/2018  . Spinal stenosis of lumbosacral region 12/18/2018   Resolved Ambulatory Problems    Diagnosis Date Noted  . No Resolved Ambulatory Problems   Past Medical History:  Diagnosis Date  . Arthritis   . Coronary artery disease   . Degenerative disc disease, lumbar   . Depression   . Diabetes mellitus without complication ([place])   . GERD (gastroesophageal reflux disease)   . Gout   . Hx of CABG   . Hypercholesteremia   . Hypertension   . PLMD (periodic limb movement disorder)   . Sleep apnea   . Thyroid disease    Constitutional Exam  General appearance: Well nourished, well developed, and well hydrated. In no apparent acute distress Vitals:   12/18/18 1001  BP: (!) 159/85  Pulse: 63  Resp: 16  Temp: 98.3 F (36.8 C)  TempSrc: Oral  SpO2: 98%  Weight: 231 lb (104.8 kg)  Height: 5\' 9"  (1.753 m)   BMI Assessment: Estimated  body mass index is 34.11 kg/m as calculated from the following:   Height as of this encounter: 5\' 9"  (1.753 m).   Weight as of this encounter: 231 lb (104.8 kg).  BMI interpretation table: BMI level Category Range association with higher incidence of chronic pain  <18 kg/m2 Underweight   18.5-24.9 kg/m2 Ideal body weight   25-29.9 kg/m2 Overweight Increased incidence by 20%  30-34.9 kg/m2 Obese (Class I) Increased incidence by 68%  35-39.9 kg/m2 Severe obesity (Class II) Increased incidence by 136%  >40 kg/m2 Extreme obesity (Class III) Increased incidence by 254%   Patient's current BMI Ideal Body weight  Body mass index is 34.11 kg/m. Ideal body weight: 70.7 kg (155 lb 13.8 oz) Adjusted ideal body weight: 84.3 kg (185 lb 14.7 oz)   BMI Readings from Last 4 Encounters:  12/18/18 34.11 kg/m  11/22/18 32.75 kg/m  06/04/17 34.21 kg/m  09/19/16 33.97 kg/m   Wt Readings from Last 4 Encounters:  12/18/18 231 lb (104.8 kg)  11/22/18 221 lb 12.8 oz (100.6 kg)  06/04/17 225 lb (102.1 kg)  09/19/16 230 lb (104.3 kg)  Psych/Mental status: Alert, oriented x 3 (person, place, & time)       Eyes: PERLA Respiratory: No evidence of acute respiratory distress  Cervical Spine Area Exam  Skin & Axial Inspection: No masses, redness, edema, swelling, or associated skin lesions Alignment: Symmetrical Functional ROM: Unrestricted ROM      Stability: No instability detected Muscle Tone/Strength: Functionally intact. No obvious neuro-muscular anomalies detected. Sensory (Neurological): Unimpaired Palpation: No palpable  anomalies              Upper Extremity (UE) Exam    Side: Right upper extremity  Side: Left upper extremity  Skin & Extremity Inspection: Skin color, temperature, and hair growth are WNL. No peripheral edema or cyanosis. No masses, redness, swelling, asymmetry, or associated skin lesions. No contractures.  Skin & Extremity Inspection: Skin color, temperature, and hair growth  are WNL. No peripheral edema or cyanosis. No masses, redness, swelling, asymmetry, or associated skin lesions. No contractures.  Functional ROM: Unrestricted ROM          Functional ROM: Unrestricted ROM          Muscle Tone/Strength: Functionally intact. No obvious neuro-muscular anomalies detected.  Muscle Tone/Strength: Functionally intact. No obvious neuro-muscular anomalies detected.  Sensory (Neurological): Unimpaired          Sensory (Neurological): Unimpaired          Palpation: No palpable anomalies              Palpation: No palpable anomalies              Provocative Test(s):  Phalen's test: deferred Tinel's test: deferred Apley's scratch test (touch opposite shoulder):  Action 1 (Across chest): deferred Action 2 (Overhead): deferred Action 3 (LB reach): deferred   Provocative Test(s):  Phalen's test: deferred Tinel's test: deferred Apley's scratch test (touch opposite shoulder):  Action 1 (Across chest): deferred Action 2 (Overhead): deferred Action 3 (LB reach): deferred    Thoracic Spine Area Exam  Skin & Axial Inspection: No masses, redness, or swelling Alignment: Symmetrical Functional ROM: Unrestricted ROM Stability: No instability detected Muscle Tone/Strength: Functionally intact. No obvious neuro-muscular anomalies detected. Sensory (Neurological): Unimpaired Muscle strength & Tone: No palpable anomalies  Lumbar Spine Area Exam  Skin & Axial Inspection: Well healed scar from previous spine surgery detected Alignment: Symmetrical Functional ROM: Pain restricted ROM affecting primarily the right Stability: No instability detected Muscle Tone/Strength: Functionally intact. No obvious neuro-muscular anomalies detected. Sensory (Neurological): Dermatomal pain pattern Palpation: No palpable anomalies       Provocative Tests: Hyperextension/rotation test: (+) bilaterally for facet joint pain. Lumbar quadrant test (Kemp's test): (+) on the right for foraminal  stenosis Lateral bending test: (+) ipsilateral radicular pain, on the right. Positive for right-sided foraminal stenosis. Patrick's Maneuver: (+) due to pain             FABER* test: deferred today                   S-I anterior distraction/compression test: deferred today         S-I lateral compression test: deferred today         S-I Thigh-thrust test: deferred today         S-I Gaenslen's test: deferred today         *(Flexion, ABduction and External Rotation)  Gait & Posture Assessment  Ambulation: Limited Gait: Age-related, senile gait pattern Posture: Difficulty with positional changes   Lower Extremity Exam    Side: Right lower extremity  Side: Left lower extremity  Stability: Unstable          Stability: No instability observed          Skin & Extremity Inspection: Skin color, temperature, and hair growth are WNL. No peripheral edema or cyanosis. No masses, redness, swelling, asymmetry, or associated skin lesions. No contractures.  Skin & Extremity Inspection: Skin color, temperature, and hair growth are WNL. No peripheral edema  or cyanosis. No masses, redness, swelling, asymmetry, or associated skin lesions. No contractures.  Functional ROM: Decreased ROM for hip and knee joints          Functional ROM: Unrestricted ROM                  Muscle Tone/Strength: Functionally intact. No obvious neuro-muscular anomalies detected.  Muscle Tone/Strength: Functionally intact. No obvious neuro-muscular anomalies detected.  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  Visit Diagnosis (New problems to examiner): 1. 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 Lumbar radiculopathy   4. Spinal stenosis, lumbar region, with neurogenic claudication   5. Lumbar degenerative disc disease   6. Chronic pain syndrome   7. Spinal stenosis of lumbosacral region    General Recommendations: The pain condition that the patient suffers from is best treated with a multidisciplinary approach that involves an increase in physical activity to prevent de-conditioning and worsening of the pain cycle, as well as psychological counseling (formal and/or informal) to address the co-morbid psychological affects of pain. Treatment will often involve judicious use of pain medications and interventional procedures to decrease the pain, allowing the patient to participate in the physical activity that will ultimately produce [place]-lasting pain reductions. The goal of the multidisciplinary approach is to return the patient to a higher level of overall function and to restore their ability to perform activities of daily living.  Patient is a very pleasant 73 year old [place] veteran who presents with a chief complaint of low back pain with radiation down his right anterolateral thigh to his lateral calf.  This pain started in 1997, 1998.  In 1998 he had minimally invasive lumbar spine surgery which sounds like a lumbar laminectomy.  Then in 2017, patient had return of his low back pain and ended up seeing Dr. Aris Lot at [facility] and had an L3-L4 TLIF performed.  He states that since then he is been having significant pain in his right axial low back with radiation to his right leg.  He was scheduled to have a  revision surgery in approximately April but due to [place], that has been postponed.  He was also supposed to have injections done at the [place] but due to [place], has been lost to follow-up and is very frustrated that he has not had injections performed.  Patient has not had an MRI done since his L3-L4 TLIF in 2017.  He is having weakness in his right leg and having difficulty walking.  He denies any bowel or bladder dysfunction.  He states that he does not feel confident bearing weight on his right leg due to weakness.  In regards to therapeutic plan, patient has not had a lumbar MRI performed since his previous lumbar spine surgery in 2017, L3-L4 TLIF.  Given that he is having chronic,  severe right radicular pain that is resulting in weakness and paresthesias, I recommend lumbar MRI without contrast for further work-up.  Depending upon imaging results, we can discuss interventional pain plan which may include epidural steroid injections, diagnostic facet blocks, lumbar radiofrequency ablation and/or thoracolumbar spinal cord stimulation.  I will call the patient with MRI results as soon he completes it.  In regards to medication management, patient has been taking Aleve to help manage his symptoms.  Given his neuropathic pain, recommend gabapentin as below.  Future considerations could include Lyrica or Cymbalta.  Future consideration could also include muscle relaxant and low-dose opioid therapy.  Plan of Care (Initial workup plan)  Note: Mr. Hosseini was reminded that as per protocol, today's visit has been an evaluation only. We have not taken over the patient's controlled substance management.   Imaging Orders     MR LUMBAR SPINE WO CONTRAST  Pharmacotherapy (current): Medications ordered:  Meds ordered this encounter  Medications  . gabapentin (NEURONTIN) 300 MG capsule    Sig: Take 1 capsule (300 mg total) by mouth at bedtime for 21 days, THEN 1 capsule (300 mg total) 2 (two) times daily.     Dispense:  99 capsule    Refill:  0  . predniSONE (DELTASONE) 20 MG tablet    Sig: Take 3 tablets (60 mg total) by mouth daily with breakfast for 3 days, THEN 2 tablets (40 mg total) daily with breakfast for 3 days, THEN 1 tablet (20 mg total) daily with breakfast for 3 days.    Dispense:  18 tablet    Refill:  0   Medications administered during this visit: Reed Pandy. Lafevers had no medications administered during this visit.   Pharmacological management options:  Opioid Analgesics: The patient was informed that there is no guarantee that he would be a candidate for opioid analgesics. The decision will be made following CDC guidelines. This decision will be based on the results of diagnostic studies, as well as Mr. Dinh risk profile.   Membrane stabilizer: Gabapentin as above.  Can consider Lyrica or Cymbalta in future  Muscle relaxant: To be determined at a later time  NSAID: Continue naproxen  Other analgesic(s): To be determined at a later time   Interventional management options: Mr. Flint was informed that there is no guarantee that he would be a candidate for interventional therapies. The decision will be based on the results of diagnostic studies, as well as Mr. Garrity risk profile.  Procedure(s) under consideration:  Lumbar epidural steroid injection, L3-L4 TLIF Lumbar facet medial branch nerve block Thoracolumbar spinal cord stimulation   Provider-requested follow-up: Return in about 6 weeks (around 01/29/2019) for Medication Management, in person.  Future Appointments  Date Time Provider [place]  01/28/2019  1:30 PM Gillis Santa, MD [facility] None    Primary Care Physician: Derinda Late, MD Location: [facility] Note by: Gillis Santa, MD Date: 12/18/2018; Time: 2:59 PM  Note: This dictation was prepared with Dragon dictation. Any transcriptional errors that may result from this process are unintentional.

## 2019-01-19 ENCOUNTER — Telehealth: Payer: Self-pay | Admitting: *Deleted

## 2019-01-19 NOTE — Telephone Encounter (Signed)
P 

## 2019-01-28 ENCOUNTER — Other Ambulatory Visit: Payer: Self-pay

## 2019-01-28 ENCOUNTER — Ambulatory Visit
Payer: Medicare Other | Attending: Student in an Organized Health Care Education/Training Program | Admitting: Student in an Organized Health Care Education/Training Program

## 2019-01-28 ENCOUNTER — Encounter: Payer: Self-pay | Admitting: Student in an Organized Health Care Education/Training Program

## 2019-01-28 VITALS — BP 163/75 | HR 64 | Temp 97.8°F | Resp 18 | Ht 69.0 in | Wt 230.0 lb

## 2019-01-28 DIAGNOSIS — M5416 Radiculopathy, lumbar region: Secondary | ICD-10-CM

## 2019-01-28 DIAGNOSIS — M51369 Other intervertebral disc degeneration, lumbar region without mention of lumbar back pain or lower extremity pain: Secondary | ICD-10-CM

## 2019-01-28 DIAGNOSIS — G894 Chronic pain syndrome: Secondary | ICD-10-CM

## 2019-01-28 DIAGNOSIS — M4807 Spinal stenosis, lumbosacral region: Secondary | ICD-10-CM

## 2019-01-28 DIAGNOSIS — G8929 Other chronic pain: Secondary | ICD-10-CM | POA: Diagnosis present

## 2019-01-28 DIAGNOSIS — Z0289 Encounter for other administrative examinations: Secondary | ICD-10-CM | POA: Diagnosis present

## 2019-01-28 DIAGNOSIS — M48062 Spinal stenosis, lumbar region with neurogenic claudication: Secondary | ICD-10-CM

## 2019-01-28 DIAGNOSIS — M5136 Other intervertebral disc degeneration, lumbar region: Secondary | ICD-10-CM | POA: Diagnosis present

## 2019-01-28 DIAGNOSIS — Z981 Arthrodesis status: Secondary | ICD-10-CM | POA: Diagnosis present

## 2019-01-28 MED ORDER — TRAMADOL HCL 50 MG PO TABS: 50.0000 mg | ORAL_TABLET | Freq: Four times a day (QID) | ORAL | 2 refills | Status: AC | PRN

## 2019-01-28 MED ORDER — GABAPENTIN 300 MG PO CAPS: ORAL_CAPSULE | ORAL | 2 refills | Status: DC

## 2019-01-28 NOTE — Progress Notes (Signed)
Patient's Name: Tim Miranda  MRN: [id number]  Referring Provider: Derinda Late, MD  DOB: 06-08-1945  PCP: Derinda Late, MD  DOS: 01/28/2019  Note by: Gillis Santa, MD  Service setting: Ambulatory outpatient  Attending: Gillis Santa, MD  Location: [facility] (AMB) Pain Management Facility  Specialty: Interventional Pain Management  Patient type: Established   Primary Reason(s) for Visit: Encounter for evaluation before starting new chronic pain management plan of care (Level of risk: moderate) CC: Back Pain (low)  HPI  Tim Miranda is a 73 y.o. year old, male patient, who comes today for a follow-up evaluation to review the test results and decide on a treatment plan. 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 His primarily concern today is the Back Pain (low)  Pain Assessment: Location: Lower Back Radiating: legs Onset: More than a month ago Duration: Chronic pain Quality: Sharp, Constant Severity: 8 /10 (subjective, self-reported pain score)  Note: Reported level is compatible with observation. Effect on ADL:   Timing: Constant Modifying factors: rest, positioning BP: (!) 163/75  HR: 64  Tim Miranda comes in today for a follow-up visit after his initial evaluation on 01/19/2019. Today we went over the results of his tests. These were explained in "Layman's terms". During today's appointment we went over my diagnostic impression, as well as the proposed treatment plan.  Follows up for second patient visit.  Is awaiting his lumbar MRI which is scheduled in 2 days.  He is continuing to endorse significant low back pain with radiation into bilateral legs, right greater than left along with weakness of his lower extremities.  He also  has difficulty sleeping at night as result of his pain and cannot get into a comfortable position.  He did find temporary benefit with prednisone taper.  He is finding benefit with gabapentin.  No side effects other than mild vision changes during the day which have gotten better.   Controlled Substance Pharmacotherapy Assessment REMS (Risk Evaluation and Mitigation Strategy)  Analgesic: Tramadol 50 mg 4 times daily as needed, quantity 120/month Pill Count: None expected due to no prior prescriptions written by our practice. Hart Rochester, RN  01/28/2019  1:09 PM  Sign when Signing Visit Safety precautions to be maintained throughout the outpatient stay will include: orient to surroundings, keep bed in low position, maintain call bell within reach at all times, provide assistance with transfer out of bed and ambulation.     Monitoring:  PMP: PDMP reviewed during this encounter. Online review of the past 44-month period previously conducted. Not applicable at this point since we have not taken over the patient's medication management yet. Medication Assessment Form: Patient introduced to form today Treatment compliance: Treatment may start today if patient agrees with proposed plan. Evaluation of compliance is not applicable at this point Risk Assessment Profile: Aberrant behavior: See initial evaluations. None observed or detected today Comorbid factors increasing risk of overdose: See initial evaluation. No additional risks detected today Opioid risk tool (ORT):  Opioid Risk  12/18/2018  Alcohol 0  Illegal Drugs 0  Rx Drugs 0  Alcohol 0  Illegal Drugs 0  Rx Drugs 0  Age between 16-45 years  0  History of Preadolescent Sexual Abuse 0  Psychological Disease 2  Depression 1  Opioid Risk Tool  Scoring 3  Opioid Risk Interpretation Low Risk    ORT Scoring interpretation table:  Score <3 = Low Risk for SUD  Score between 4-7 = Moderate Risk for SUD  Score >8 = High Risk for  Opioid Abuse   Risk of substance use disorder (SUD): Low  Risk Mitigation Strategies:  Patient opioid safety counseling: Completed today. Counseling provided to patient as per "Patient Counseling Document". Document signed by patient, attesting to counseling and understanding Patient-Prescriber Agreement (PPA): Obtained today.  Controlled substance notification to other providers: Written and sent today.  Pharmacologic Plan: Today we may be taking over the patient's pharmacological regimen. See below.             Laboratory Chemistry Profile   Screening Lab Results  Component Value Date   SARSCOV2NAA NEGATIVE 11/21/2018     Renal Lab Results  Component Value Date   BUN 18 11/22/2018   CREATININE 1.22 11/22/2018   GFRAA >60 11/22/2018   GFRNONAA 58 (L) 11/22/2018                             Hepatic Lab Results  Component Value Date   AST 33 07/08/2016   ALT 22 07/08/2016   ALBUMIN 4.2 07/08/2016   ALKPHOS 45 07/08/2016                        Electrolytes Lab Results  Component Value Date   NA 142 11/22/2018   K 4.6 11/22/2018   CL 105 11/22/2018   CALCIUM 9.2 11/22/2018                        Neuropathy Lab Results  Component Value Date   HGBA1C 7.9 (H) 11/21/2018                         Coagulation Lab Results  Component Value Date   PLT 272 11/22/2018                        Cardiovascular Lab Results  Component Value Date   BNP 93.0 11/21/2018   TROPONINI <0.03 06/05/2017   HGB 15.5 11/22/2018   HCT 44.9 11/22/2018                         ID Lab Results  Component Value Date   SARSCOV2NAA NEGATIVE 11/21/2018    Cancer No results found for: CEA, CA125, LABCA2                      Endocrine No results found for: TSH, FREET4, TESTOFREE, TESTOSTERONE, SHBG, ESTRADIOL, ESTRADIOLPCT, ESTRADIOLFRE, LABPREG, ACTH                      Note: Lab results reviewed.  Recent Diagnostic Imaging Review  Lumbar MR wo contrast: No procedure  found. Lumbar MR w/wo contrast:  Results for orders placed during the hospital encounter of 12/22/15  MR Lumbar Spine W Wo Contrast   Narrative CLINICAL DATA:  73 year old male with prior lumbar surgery, most recently in February this year. Right side lumbar back pain radiating to the medial right leg and knee. Initial encounter.  EXAM: MRI LUMBAR SPINE WITHOUT AND WITH CONTRAST  TECHNIQUE: Multiplanar and multiecho pulse sequences of the lumbar spine were obtained without and  with intravenous contrast.  CONTRAST:  72mL MULTIHANCE GADOBENATE DIMEGLUMINE 529 MG/ML IV SOLN  COMPARISON:  None.  FINDINGS: Segmentation: Lumbar segmentation appears to be normal and will be designated as such for this report.  Alignment: Mild retrolisthesis of L5 on S1. Mild straightening of lumbar lordosis. Slight levoconvex lumbar scoliosis.  Vertebrae: No marrow edema or evidence of acute osseous abnormality.  Conus medullaris: Extends to the L1-L2 level and appears normal. No abnormal intradural enhancement.  Paraspinal and other soft tissues: Simple appearing medial right renal cyst. Otherwise negative visualized abdominal viscera.  Mild postoperative changes to the posterior paraspinal soft tissues most apparent at L2 and L3. No postoperative fluid collection identified.  Disc levels:  T11-T12:  Mild facet hypertrophy.  T12-L1:  Negative.  L1-L2: Mild mostly far lateral disc bulging. No significant stenosis.  L2-L3: Circumferential disc bulge with broad-based posterior component. Mild to moderate facet and ligament flavum hypertrophy greater on the right. Trace facet joint fluid. Mild epidural lipomatosis.  Mild to moderate spinal stenosis (series 6, image 10). Mild right L2 foraminal stenosis.  L3-L4: Circumferential disc bulge with superimposed right subarticular and foraminal cephalad disc extrusion. See series 3, images 5 and 6 and series 6 images 15 through 17. The  extruded disc is estimated at 10-11 mm in size. Severe associated right L3 foraminal stenosis. Superimposed postoperative changes on the right including partial right laminectomy. The subarticular disc is also affecting the right lateral recess, with up to moderate stenosis at the level of the descending right L4 nerve roots. No spinal stenosis. No left foraminal stenosis.  L4-L5: Vacuum disc. Left eccentric circumferential disc bulge with broad-based posterior component. Mild to moderate facet and ligament flavum hypertrophy. Evidence of prior surgery to the right lateral recess with no adverse features. Mild to moderate left lateral recess stenosis. Endplate spurring. Mild to moderate bilateral L4 foraminal stenosis.  L5-S1: Circumferential disc bulge. Broad-based posterior component. Mild to moderate facet hypertrophy greater on the left. No spinal or lateral recess stenosis. Endplate spurring. Moderate left and mild to moderate right L5 foraminal stenosis.  IMPRESSION: 1. Symptomatic level appears to be L3-L4 where a right subarticular and foraminal disc extrusion severely affects the exiting right L3 nerve and results in up to moderate stenosis at the right lateral recess. Query right L3 and/or L4 radiculitis. 2. Previous postoperative changes on the right at L3-L4. Postoperative changes also suspected on the right at L4-L5. 3. Multifactorial mild to moderate spinal stenosis at L2-L3. Multifactorial left lateral recess stenosis at L4-L5. Up to moderate multifactorial neural foraminal stenosis at the bilateral L4 and L5 nerve levels.   Electronically Signed   By: Genevie Ann M.D.   On: 12/22/2015 16:23    Lumbar CT wo contrast:  Results for orders placed during the hospital encounter of 01/02/16  CT LUMBAR SPINE WO CONTRAST   Narrative CLINICAL DATA:  Low back pain. Surgery favor 10/2015. Lumbar radiculopathy.  EXAM: CT LUMBAR SPINE WITHOUT  CONTRAST  TECHNIQUE: Multidetector CT imaging of the lumbar spine was performed without intravenous contrast administration. Multiplanar CT image reconstructions were also generated.  COMPARISON:  MRI of the lumbar spine 12/22/2015  FINDINGS: Segmentation: 5 non rib-bearing lumbar type vertebral bodies are present.  Alignment: Slight anterolisthesis is present at L4-5. Levoconvex scoliosis is centered at L3-4.  Vertebrae: Asymmetric endplate changes are present on the right at L4-5. Vertebral body heights are maintained throughout.  Paraspinal and other soft tissues: An exophytic low-density lesion at the upper pole the right  kidney measures 3.2 cm, likely a simple cyst. Atherosclerotic calcifications are present in the aorta without aneurysm. There is no significant adenopathy.  Disc levels: T12-L1:  Negative.  L1-2: Mild disc bulging is present without significant stenosis or change.  L2-3: A broad-based disc protrusion is asymmetric to the left. There is mild subarticular narrowing bilaterally, left greater than right. Mild left foraminal narrowing is again seen.  L3-4: A a prominent disc extrusion extends into the right neural foramen with severe right foraminal stenosis. This impacts the right subarticular space is well. Mild left subarticular narrowing is present. Mild left foraminal narrowing is present as well.  L4-5: A broad-based disc protrusion is present. Facet hypertrophy is slightly worse on the right. The disc protrusion is asymmetric to the left. This results in moderate subarticular and foraminal narrowing bilaterally.  L5-S1: A broad-based disc protrusion is present. Mild facet hypertrophy is noted bilaterally. This results in mild bilateral foraminal narrowing, left greater than right.  IMPRESSION: 1. Severe right foraminal stenosis at L3-4 secondary to a a far right lateral disc extrusion. 2. Moderate right subarticular narrowing at L3-4. 3. Mild  left subarticular and foraminal stenosis at L3-4. 4. Moderate subarticular and foraminal stenosis bilaterally at L4-5. 5. Levoconvex scoliosis of the lumbar spine is centered at L3-4. 6. Mild subarticular narrowing at L2-3 is worse on the left. Mild left foraminal stenosis is also present at L2-3. 7. Mild bilateral foraminal narrowing at L5-S1 is slightly worse on the left.   Electronically Signed   By: San Morelle M.D.   On: 01/02/2016 10:44     Complexity Note: Imaging results reviewed. Results shared with Tim Miranda, using Layman's terms.                         Meds   Current Outpatient Medications:  .  allopurinol (ZYLOPRIM) 300 MG tablet, Take 300 mg by mouth daily., Disp: , Rfl:  .  aspirin EC 81 MG tablet, Take 81 mg by mouth daily., Disp: , Rfl:  .  carvedilol (COREG) 12.5 MG tablet, Take 12.5 mg by mouth 2 (two) times daily. , Disp: , Rfl:  .  cholecalciferol (VITAMIN D) 25 MCG (1000 UT) tablet, Take 2,000 Units by mouth daily. , Disp: , Rfl:  .  Coenzyme Q10 100 MG capsule, Take 100 mg by mouth daily. , Disp: , Rfl:  .  furosemide (LASIX) 40 MG tablet, Take 0.5 tablets (20 mg total) by mouth daily as needed (leg swelling or weight gain of 3lb or more)., Disp: 30 tablet, Rfl: 0 .  glipiZIDE (GLUCOTROL) 10 MG tablet, Take 10 mg by mouth 2 (two) times daily before a meal., Disp: , Rfl:  .  levothyroxine (SYNTHROID, LEVOTHROID) 50 MCG tablet, Take 50 mcg by mouth daily before breakfast., Disp: , Rfl:  .  losartan (COZAAR) 50 MG tablet, Take 50 mg by mouth daily. , Disp: , Rfl:  .  Misc Natural Products (MENS PROSTATE HEALTH FORMULA PO), Take 1 tablet by mouth daily., Disp: , Rfl:  .  montelukast (SINGULAIR) 10 MG tablet, Take 10 mg by mouth at bedtime., Disp: , Rfl:  .  Multiple Vitamins-Minerals (MULTIVITAMINS THER. W/MINERALS) TABS tablet, Take 1 tablet by mouth daily., Disp: , Rfl:  .  naproxen sodium (ALEVE) 220 MG tablet, Take 220 mg by mouth., Disp: , Rfl:  .   Omega-3 Fatty Acids (FISH OIL ODOR-LESS) 1200 MG CAPS, Take 3,600 mg by mouth at bedtime., Disp: ,  Rfl:  .  ONGLYZA 5 MG TABS tablet, Take 5 mg by mouth daily., Disp: , Rfl:  .  pravastatin (PRAVACHOL) 80 MG tablet, Take 80 mg by mouth at bedtime. , Disp: , Rfl:  .  gabapentin (NEURONTIN) 300 MG capsule, 300 mg qAM, 600 mg QHS, Disp: 90 capsule, Rfl: 2 .  traMADol (ULTRAM) 50 MG tablet, Take 1 tablet (50 mg total) by mouth every 6 (six) hours as needed for severe pain. Month last 30 days., Disp: 120 tablet, Rfl: 2  ROS  Constitutional: Denies any fever or chills Gastrointestinal: No reported hemesis, hematochezia, vomiting, or acute GI distress Musculoskeletal: Denies any acute onset joint swelling, redness, loss of ROM, or weakness Neurological: No reported episodes of acute onset apraxia, aphasia, dysarthria, agnosia, amnesia, paralysis, loss of coordination, or loss of consciousness  Allergies  Tim Miranda is allergic to isosorbide; lisinopril; and metformin and related.  PFSH  Drug: Tim Miranda  reports no history of drug use. Alcohol:  reports no history of alcohol use. Tobacco:  reports that he has quit smoking. His smoking use included cigarettes. He has a 0.75 pack-year smoking history. He has never used smokeless tobacco. Medical:  has a past medical history of Arthritis, Coronary artery disease, Degenerative disc disease, lumbar, Depression, Diabetes mellitus without complication ([place]), GERD (gastroesophageal reflux disease), Gout, CABG, Hypercholesteremia, Hypertension, PLMD (periodic limb movement disorder), Sleep apnea, and Thyroid disease. Surgical: Tim Miranda  has a past surgical history that includes Back surgery; Coronary artery bypass graft; Eye surgery; Shoulder surgery; Carpal tunnel release; Colonoscopy with propofol (N/A, 09/19/2016); and Esophagogastroduodenoscopy (egd) with propofol (N/A, 09/19/2016). Family: family history includes Heart disease in his father and  mother.  Constitutional Exam  General appearance: Well nourished, well developed, and well hydrated. In no apparent acute distress Vitals:   01/28/19 1305  BP: (!) 163/75  Pulse: 64  Resp: 18  Temp: 97.8 F (36.6 C)  TempSrc: Oral  SpO2: 96%  Weight: 230 lb (104.3 kg)  Height: 5\' 9"  (1.753 m)   BMI Assessment: Estimated body mass index is 33.97 kg/m as calculated from the following:   Height as of this encounter: 5\' 9"  (1.753 m).   Weight as of this encounter: 230 lb (104.3 kg).  BMI interpretation table: BMI level Category Range association with higher incidence of chronic pain  <18 kg/m2 Underweight   18.5-24.9 kg/m2 Ideal body weight   25-29.9 kg/m2 Overweight Increased incidence by 20%  30-34.9 kg/m2 Obese (Class I) Increased incidence by 68%  35-39.9 kg/m2 Severe obesity (Class II) Increased incidence by 136%  >40 kg/m2 Extreme obesity (Class III) Increased incidence by 254%   Patient's current BMI Ideal Body weight  Body mass index is 33.97 kg/m. Ideal body weight: 70.7 kg (155 lb 13.8 oz) Adjusted ideal body weight: 84.2 kg (185 lb 8.3 oz)   BMI Readings from Last 4 Encounters:  01/28/19 33.97 kg/m  12/18/18 34.11 kg/m  11/22/18 32.75 kg/m  06/04/17 34.21 kg/m   Wt Readings from Last 4 Encounters:  01/28/19 230 lb (104.3 kg)  12/18/18 231 lb (104.8 kg)  11/22/18 221 lb 12.8 oz (100.6 kg)  06/04/17 225 lb (102.1 kg)  Psych/Mental status: Alert, oriented x 3 (person, place, & time)       Eyes: PERLA Respiratory: No evidence of acute respiratory distress  Cervical Spine Area Exam  Skin & Axial Inspection: No masses, redness, edema, swelling, or associated skin lesions Alignment: Symmetrical Functional ROM: Unrestricted ROM  Stability: No instability detected Muscle Tone/Strength: Functionally intact. No obvious neuro-muscular anomalies detected. Sensory (Neurological): Unimpaired Palpation: No palpable anomalies              Upper Extremity (UE)  Exam    Side: Right upper extremity  Side: Left upper extremity  Skin & Extremity Inspection: Skin color, temperature, and hair growth are WNL. No peripheral edema or cyanosis. No masses, redness, swelling, asymmetry, or associated skin lesions. No contractures.  Skin & Extremity Inspection: Skin color, temperature, and hair growth are WNL. No peripheral edema or cyanosis. No masses, redness, swelling, asymmetry, or associated skin lesions. No contractures.  Functional ROM: Unrestricted ROM          Functional ROM: Unrestricted ROM          Muscle Tone/Strength: Functionally intact. No obvious neuro-muscular anomalies detected.  Muscle Tone/Strength: Functionally intact. No obvious neuro-muscular anomalies detected.  Sensory (Neurological): Unimpaired          Sensory (Neurological): Unimpaired          Palpation: No palpable anomalies              Palpation: No palpable anomalies              Provocative Test(s):  Phalen's test: deferred Tinel's test: deferred Apley's scratch test (touch opposite shoulder):  Action 1 (Across chest): deferred Action 2 (Overhead): deferred Action 3 (LB reach): deferred   Provocative Test(s):  Phalen's test: deferred Tinel's test: deferred Apley's scratch test (touch opposite shoulder):  Action 1 (Across chest): deferred Action 2 (Overhead): deferred Action 3 (LB reach): deferred    Thoracic Spine Area Exam  Skin & Axial Inspection: No masses, redness, or swelling Alignment: Symmetrical Functional ROM: Unrestricted ROM Stability: No instability detected Muscle Tone/Strength: Functionally intact. No obvious neuro-muscular anomalies detected. Sensory (Neurological): Unimpaired Muscle strength & Tone: No palpable anomalies Lumbar Spine Area Exam  Skin & Axial Inspection: Well healed scar from previous spine surgery detected Alignment: Symmetrical Functional ROM: Pain restricted ROM affecting primarily the right Stability: No instability detected Muscle  Tone/Strength: Functionally intact. No obvious neuro-muscular anomalies detected. Sensory (Neurological): Dermatomal pain pattern Palpation: No palpable anomalies       Provocative Tests: Hyperextension/rotation test: (+) bilaterally for facet joint pain. Lumbar quadrant test (Kemp's test): (+) on the right for foraminal stenosis Lateral bending test: (+) ipsilateral radicular pain, on the right. Positive for right-sided foraminal stenosis. Patrick's Maneuver: (+) due to pain             FABER* test: deferred today                   S-I anterior distraction/compression test: deferred today         S-I lateral compression test: deferred today         S-I Thigh-thrust test: deferred today         S-I Gaenslen's test: deferred today         *(Flexion, ABduction and External Rotation)  Gait & Posture Assessment  Ambulation: Limited Gait: Age-related, senile gait pattern Posture: Difficulty with positional changes   Lower Extremity Exam    Side: Right lower extremity  Side: Left lower extremity  Stability: Unstable          Stability: No instability observed          Skin & Extremity Inspection: Skin color, temperature, and hair growth are WNL. No peripheral edema or cyanosis. No masses, redness, swelling, asymmetry, or associated skin lesions.  No contractures.  Skin & Extremity Inspection: Skin color, temperature, and hair growth are WNL. No peripheral edema or cyanosis. No masses, redness, swelling, asymmetry, or associated skin lesions. No contractures.  Functional ROM: Decreased ROM for hip and knee joints          Functional ROM: Unrestricted ROM                  Muscle Tone/Strength: Functionally intact. No obvious neuro-muscular anomalies detected.  Muscle Tone/Strength: Functionally intact. No obvious neuro-muscular anomalies detected.  Sensory (Neurological): Dermatomal pain pattern        Sensory (Neurological): Unimpaired        DTR: Patellar: 0: absent Achilles: 0:  absent Plantar: deferred today  DTR: Patellar: 1+: trace Achilles: 1+: trace Plantar: deferred today  Palpation: No palpable anomalies  Palpation: No palpable anomalies     Assessment & Plan  Primary Diagnosis & Pertinent Problem List: The primary encounter diagnosis was History of lumbar fusion (L3-L4 TLIF Nov 2017). 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  Visit Diagnosis: 1. 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 Lumbar radiculopathy   3. 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 Spinal stenosis, lumbar region, with neurogenic claudication   5. Lumbar degenerative disc disease   6. Spinal stenosis of lumbosacral region   7. Chronic pain syndrome   8. Pain management contract signed    Patient is a very pleasant 73 year old [place] veteran who presents with a chief complaint of low back pain with radiation down his right anterolateral thigh to his lateral calf.  This pain started in 1997, 1998.  In 1998 he had minimally invasive lumbar spine surgery which sounds like a lumbar laminectomy.  Then in 2017, patient had return of his low back pain and ended up seeing Dr. Aris Lot at [facility] and had an L3-L4 TLIF performed.  He states that since then he is been having significant pain in his right axial low back with radiation to his right leg.  He was scheduled to have a revision surgery in approximately April but due to [place], that has been postponed.  He was also supposed to have injections done at the [place] but due to [place], has been lost to follow-up and is very frustrated that he has not had injections performed.  Patient has not had an MRI done since his L3-L4 TLIF in 2017.  He is having weakness in his right leg and having  difficulty walking.  He denies any bowel or bladder dysfunction.  He states that he does not feel confident bearing weight on his right leg due to weakness.  In regards to therapeutic plan, patient has not had a lumbar MRI performed since his previous lumbar spine surgery in 2017, L3-L4 TLIF.  Given that he is having chronic, severe right radicular pain that is resulting in weakness and paresthesias, I have ordered lumbar MRI without contrast for further work-up (done at first visit) which patient will complete in 2 days.  Depending upon imaging results, we can discuss interventional pain plan which may include epidural steroid injections, diagnostic facet blocks, lumbar radiofrequency ablation and/or thoracolumbar spinal cord stimulation.  I will call the patient with MRI results as soon he completes it.  See below for medication management.  1.  Increase gabapentin as below.  300 mg every morning, increase nighttime dose to 60 mg nightly. 2.  Start tramadol as below.  Sign opioid contract. 3.  Patient is scheduled to have a lumbar MRI in 2 days.  Will contact patient next week with results and to discuss interventional treatment plan.   Future considerations could include Lyrica or Cymbalta.  Future consideration could also include muscle relaxant   Plan of Care  Pharmacotherapy (Medications Ordered): Meds ordered this encounter  Medications  . gabapentin (NEURONTIN) 300 MG capsule    Sig: 300 mg qAM, 600 mg QHS    Dispense:  90 capsule    Refill:  2  . traMADol (ULTRAM) 50 MG tablet    Sig: Take 1 tablet (50 mg total) by mouth every 6 (six) hours as needed for severe pain. Month last 30 days.    Dispense:  120 tablet    Refill:  2    [place] STOP ACT - Not applicable. Fill one day early if pharmacy is closed on scheduled refill date.    Pharmacological management options:  Opioid Analgesics: We'll take over management today. See above orders Membrane stabilizer: Currently on a membrane  stabilizer Muscle relaxant: We have discussed the possibility of a trial NSAID: Currently on a NSAID Other analgesic(s): To be determined at a later time     Considering:   Pending L-MRI   PRN Procedures:   None at this time    Provider-requested follow-up: Return in about 3 months (around 04/30/2019) for Medication Management. Recent Visits Date Type Provider Dept  12/18/18 Office Visit Gillis Santa, MD [facility]  Showing recent visits within past 90 days and meeting all other requirements   Today's Visits Date Type Provider Dept  01/28/19 Office Visit Gillis Santa, MD [facility]  Showing today's visits and meeting all other requirements   Future Appointments No visits were found meeting these conditions.  Showing future appointments within next 90 days and meeting all other requirements   Primary Care Physician: Derinda Late, MD Location: [facility] Note by: Gillis Santa, MD Date: 01/28/2019; Time: 2:06 PM  Note: This dictation was prepared with Dragon dictation. Any transcriptional errors that may result from this process are unintentional.

## 2019-01-28 NOTE — Progress Notes (Signed)
Safety precautions to be maintained throughout the outpatient stay will include: orient to surroundings, keep bed in low position, maintain call bell within reach at all times, provide assistance with transfer out of bed and ambulation.  

## 2019-01-28 NOTE — Patient Instructions (Signed)
1. Sign pain contract 2. I will call you after you have completed MRI to review results and discuss plan

## 2019-01-30 ENCOUNTER — Other Ambulatory Visit: Payer: Self-pay

## 2019-01-30 ENCOUNTER — Ambulatory Visit
Admission: RE | Admit: 2019-01-30 | Discharge: 2019-01-30 | Disposition: A | Discharge status: Home / Self Care | Payer: Medicare Other | Source: Ambulatory Visit | Attending: Student in an Organized Health Care Education/Training Program | Admitting: Student in an Organized Health Care Education/Training Program

## 2019-01-30 DIAGNOSIS — M4807 Spinal stenosis, lumbosacral region: Secondary | ICD-10-CM | POA: Diagnosis present

## 2019-01-30 DIAGNOSIS — M5416 Radiculopathy, lumbar region: Secondary | ICD-10-CM | POA: Diagnosis present

## 2019-01-30 DIAGNOSIS — M48062 Spinal stenosis, lumbar region with neurogenic claudication: Secondary | ICD-10-CM | POA: Diagnosis present

## 2019-01-30 DIAGNOSIS — G8929 Other chronic pain: Secondary | ICD-10-CM | POA: Diagnosis present

## 2019-02-02 ENCOUNTER — Other Ambulatory Visit: Payer: Self-pay | Admitting: Student in an Organized Health Care Education/Training Program

## 2019-02-02 DIAGNOSIS — G8929 Other chronic pain: Secondary | ICD-10-CM

## 2019-02-02 DIAGNOSIS — M5416 Radiculopathy, lumbar region: Secondary | ICD-10-CM

## 2019-02-02 NOTE — Treatment Plan (Signed)
Spoke with patient this morning to review his lumbar MRI results.  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  Patient states that his leg pain is worse than his focal back pain.  His previous epidural steroid injection was over 5 years ago.  We discussed repeating.  Risk benefits reviewed and patient like to proceed.

## 2019-02-02 NOTE — Progress Notes (Unsigned)
Spoke with patient this morning to review his lumbar MRI results.  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  Patient states that his leg pain is worse than his focal back pain.  His previous epidural steroid injection was over 5 years ago.  We discussed repeating.  Risk benefits reviewed and patient like to proceed.  Orders Placed This Encounter  Procedures  . LESI (Schedule)    Standing Status:   Future    Standing Expiration Date:   03/04/2019    Scheduling Instructions:     Procedure: Interlaminar Lumbar Epidural Steroid injection (LESI)            Laterality: Midline     Sedation: without     Timeframe: ASAA    Order Specific Question:   Where will this procedure be performed?    Answer:   [facility]

## 2019-02-18 ENCOUNTER — Ambulatory Visit
Admission: RE | Admit: 2019-02-18 | Discharge: 2019-02-18 | Disposition: A | Discharge status: Home / Self Care | Payer: Medicare Other | Source: Ambulatory Visit | Attending: Student in an Organized Health Care Education/Training Program | Admitting: Student in an Organized Health Care Education/Training Program

## 2019-02-18 ENCOUNTER — Other Ambulatory Visit: Payer: Self-pay

## 2019-02-18 ENCOUNTER — Ambulatory Visit (HOSPITAL_BASED_OUTPATIENT_CLINIC_OR_DEPARTMENT_OTHER): Payer: Medicare Other | Admitting: Student in an Organized Health Care Education/Training Program

## 2019-02-18 ENCOUNTER — Encounter: Payer: Self-pay | Admitting: Student in an Organized Health Care Education/Training Program

## 2019-02-18 VITALS — BP 129/97 | HR 66 | Temp 98.1°F | Resp 18 | Ht 69.0 in | Wt 230.0 lb

## 2019-02-18 DIAGNOSIS — M5416 Radiculopathy, lumbar region: Secondary | ICD-10-CM

## 2019-02-18 DIAGNOSIS — G8929 Other chronic pain: Secondary | ICD-10-CM

## 2019-02-18 DIAGNOSIS — Z981 Arthrodesis status: Secondary | ICD-10-CM | POA: Diagnosis present

## 2019-02-18 MED ORDER — LIDOCAINE HCL 2 % IJ SOLN
20.0000 mL | Freq: Once | INTRAMUSCULAR | Status: AC
  Administered 2019-02-18: 400 mg

## 2019-02-18 MED ORDER — SODIUM CHLORIDE (PF) 0.9 % IJ SOLN
INTRAMUSCULAR | Status: AC
  Filled 2019-02-18: qty 10

## 2019-02-18 MED ORDER — LIDOCAINE HCL 2 % IJ SOLN
INTRAMUSCULAR | Status: AC
  Filled 2019-02-18: qty 20

## 2019-02-18 MED ORDER — SODIUM CHLORIDE 0.9% FLUSH
2.0000 mL | Freq: Once | INTRAVENOUS | Status: AC
  Administered 2019-02-18: 2 mL

## 2019-02-18 MED ORDER — IOHEXOL 180 MG/ML  SOLN
10.0000 mL | Freq: Once | INTRAMUSCULAR | Status: AC
  Administered 2019-02-18: 10 mL via EPIDURAL

## 2019-02-18 MED ORDER — ROPIVACAINE HCL 2 MG/ML IJ SOLN
INTRAMUSCULAR | Status: AC
  Filled 2019-02-18: qty 10

## 2019-02-18 MED ORDER — IOHEXOL 180 MG/ML  SOLN
INTRAMUSCULAR | Status: AC
  Filled 2019-02-18: qty 20

## 2019-02-18 MED ORDER — ROPIVACAINE HCL 2 MG/ML IJ SOLN
2.0000 mL | Freq: Once | INTRAMUSCULAR | Status: AC
  Administered 2019-02-18: 09:00:00 2 mL via EPIDURAL

## 2019-02-18 MED ORDER — DEXAMETHASONE SODIUM PHOSPHATE 10 MG/ML IJ SOLN
10.0000 mg | Freq: Once | INTRAMUSCULAR | Status: AC
  Administered 2019-02-18: 09:00:00 10 mg

## 2019-02-18 MED ORDER — DEXAMETHASONE SODIUM PHOSPHATE 10 MG/ML IJ SOLN
INTRAMUSCULAR | Status: AC
  Filled 2019-02-18: qty 1

## 2019-02-18 MED ORDER — LIDOCAINE HCL URETHRAL/MUCOSAL 2 % EX GEL
CUTANEOUS | Status: AC
  Filled 2019-02-18: qty 5

## 2019-02-18 NOTE — Progress Notes (Signed)
Safety precautions to be maintained throughout the outpatient stay will include: orient to surroundings, keep bed in low position, maintain call bell within reach at all times, provide assistance with transfer out of bed and ambulation.  

## 2019-02-18 NOTE — Progress Notes (Signed)
Patient's Name: Tim Miranda  MRN: [id number]  Referring Provider: Derinda Late, MD  DOB: 09/11/1945  PCP: Derinda Late, MD  DOS: 02/18/2019  Note by: Gillis Santa, MD  Service setting: Ambulatory outpatient  Specialty: Interventional Pain Management  Patient type: Established  Location: [facility] (AMB) [facility] Facility  Visit type: Interventional Procedure   Primary Reason for Visit: Interventional Pain Management Treatment. CC: Back Pain (lower)  Procedure:          Anesthesia, Analgesia, Anxiolysis:  Type: Diagnostic Inter-Laminar Epidural Steroid Injection  #1  Region: Lumbar Level: L5-S1 Level. Laterality: Midline         Type: Local Anesthesia  Local Anesthetic: Lidocaine 1-2%  Position: Prone with head of the table was raised to facilitate breathing.   Indications: 1. Lumbar radiculopathy   2. Chronic radicular lumbar pain   3. 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 (year old), male patient, seen today for interventional treatment. He  has a past surgical history that includes Back surgery; Coronary artery bypass graft; Eye surgery; Shoulder surgery; Carpal tunnel release; Colonoscopy with propofol (N/A, 09/19/2016); and Esophagogastroduodenoscopy (egd) with propofol (N/A, 09/19/2016). Tim Miranda has a current medication list which includes the following prescription(s): allopurinol, aspirin ec, carvedilol, cholecalciferol, coenzyme q10, furosemide, gabapentin, glipizide, levothyroxine, losartan, misc natural products, montelukast, multivitamins ther. w/minerals, naproxen sodium, fish oil odor-less, onglyza, pravastatin, and tramadol. His primarily concern today is the Back Pain (lower)  Initial Vital Signs:  Pulse/HCG Rate: 66ECG Heart Rate: 64 Temp: 98.1 F (36.7 C) Resp: 16 BP: 134/82 SpO2: 95 %  BMI: Estimated body mass index is 33.97 kg/m as calculated  from the following:   Height as of this encounter: 5\' 9"  (1.753 m).   Weight as of this encounter: 230 lb (104.3 kg).  Risk Assessment: Allergies: Reviewed. He is allergic to isosorbide; lisinopril; and metformin and related.  Allergy Precautions: None required Coagulopathies: Reviewed. None identified.  Blood-thinner therapy: None at this time Active Infection(s): Reviewed. None identified. Tim Miranda is afebrile  Site Confirmation: Tim Miranda was asked to confirm the procedure and laterality before marking the site Procedure checklist: Completed Consent: Before the procedure and under the influence of no sedative(s), amnesic(s), or anxiolytics, the patient was informed of the treatment options, risks and possible complications. To fulfill our ethical and legal obligations, as recommended by the American Medical Association's Code of Ethics, I have informed the patient of my clinical impression; the nature and purpose of the treatment or procedure; the risks, benefits, and possible complications of the intervention; the alternatives, including doing nothing; the risk(s) and benefit(s) of the alternative treatment(s) or procedure(s); and the risk(s) and benefit(s) of doing nothing. The patient was provided information about the general risks and possible complications associated with the procedure. These may include, but are not limited to: failure to achieve desired goals, infection, bleeding, organ or nerve damage, allergic reactions, paralysis, and death. In addition, the patient was informed of those risks and complications associated to Spine-related procedures, such as failure to decrease pain; infection (i.e.: Meningitis, epidural or intraspinal abscess); bleeding (i.e.: epidural hematoma, subarachnoid hemorrhage, or any other type of intraspinal or peri-dural bleeding); organ or nerve damage (i.e.: Any type of peripheral nerve, nerve root, or spinal cord injury) with subsequent damage to sensory,  motor, and/or autonomic systems, resulting in permanent pain, numbness, and/or weakness of one  or several areas of the body; allergic reactions; (i.e.: anaphylactic reaction); and/or death. Furthermore, the patient was informed of those risks and complications associated with the medications. These include, but are not limited to: allergic reactions (i.e.: anaphylactic or anaphylactoid reaction(s)); adrenal axis suppression; blood sugar elevation that in diabetics may result in ketoacidosis or comma; water retention that in patients with history of congestive heart failure may result in shortness of breath, pulmonary edema, and decompensation with resultant heart failure; weight gain; swelling or edema; medication-induced neural toxicity; particulate matter embolism and blood vessel occlusion with resultant organ, and/or nervous system infarction; and/or aseptic necrosis of one or more joints. Finally, the patient was informed that Medicine is not an exact science; therefore, there is also the possibility of unforeseen or unpredictable risks and/or possible complications that may result in a catastrophic outcome. The patient indicated having understood very clearly. We have given the patient no guarantees and we have made no promises. Enough time was given to the patient to ask questions, all of which were answered to the patient's satisfaction. Tim Miranda has indicated that he wanted to continue with the procedure. Attestation: I, the ordering provider, attest that I have discussed with the patient the benefits, risks, side-effects, alternatives, likelihood of achieving goals, and potential problems during recovery for the procedure that I have provided informed consent. Date  Time: 02/18/2019  8:29 AM  Pre-Procedure Preparation:  Monitoring: As per clinic protocol. Respiration, ETCO2, SpO2, BP, heart rate and rhythm monitor placed and checked for adequate function Safety Precautions: Patient was assessed for  positional comfort and pressure points before starting the procedure. Time-out: I initiated and conducted the "Time-out" before starting the procedure, as per protocol. The patient was asked to participate by confirming the accuracy of the "Time Out" information. Verification of the correct person, site, and procedure were performed and confirmed by me, the nursing staff, and the patient. "Time-out" conducted as per Joint Commission's Universal Protocol (UP.01.01.01). Time: 0914  Description of Procedure:          Target Area: The interlaminar space, initially targeting the lower laminar border of the superior vertebral body. Approach: Paramedial approach. Area Prepped: Entire Posterior Lumbar Region Prepping solution: DuraPrep (Iodine Povacrylex [0.7% available iodine] and Isopropyl Alcohol, 74% w/w) Safety Precautions: Aspiration looking for blood return was conducted prior to all injections. At no point did we inject any substances, as a needle was being advanced. No attempts were made at seeking any paresthesias. Safe injection practices and needle disposal techniques used. Medications properly checked for expiration dates. SDV (single dose vial) medications used. Description of the Procedure: Protocol guidelines were followed. The procedure needle was introduced through the skin, ipsilateral to the reported pain, and advanced to the target area. Bone was contacted and the needle walked caudad, until the lamina was cleared. The epidural space was identified using "loss-of-resistance technique" with 2-3 ml of PF-NaCl (0.9% NSS), in a 5cc LOR glass syringe.  Vitals:   02/18/19 0912 02/18/19 0917 02/18/19 0922 02/18/19 0927  BP: (!) 145/71 137/66 113/63 (!) 129/97  Pulse:      Resp: 20 18 20 18   Temp:      TempSrc:      SpO2: 95% 95% 95% 95%  Weight:      Height:        Start Time: 0914 hrs. End Time: 0927 hrs.  Materials:  Needle(s) Type: Epidural needle Gauge: 17G Length:  3.5-in Medication(s): Please see orders for medications and dosing details. 8  cc solution made of 5 cc of preservative-free saline, 2 cc of 0.2% ropivacaine, 1 cc of Decadron 10 mg/cc.  Imaging Guidance (Spinal):          Type of Imaging Technique: Fluoroscopy Guidance (Spinal) Indication(s): Assistance in needle guidance and placement for procedures requiring needle placement in or near specific anatomical locations not easily accessible without such assistance. Exposure Time: Please see nurses notes. Contrast: Before injecting any contrast, we confirmed that the patient did not have an allergy to iodine, shellfish, or radiological contrast. Once satisfactory needle placement was completed at the desired level, radiological contrast was injected. Contrast injected under live fluoroscopy. No contrast complications. See chart for type and volume of contrast used. Fluoroscopic Guidance: I was personally present during the use of fluoroscopy. "Tunnel Vision Technique" used to obtain the best possible view of the target area. Parallax error corrected before commencing the procedure. "Direction-depth-direction" technique used to introduce the needle under continuous pulsed fluoroscopy. Once target was reached, antero-posterior, oblique, and lateral fluoroscopic projection used confirm needle placement in all planes. Images permanently stored in EMR. Interpretation: I personally interpreted the imaging intraoperatively. Adequate needle placement confirmed in multiple planes. Appropriate spread of contrast into desired area was observed. No evidence of afferent or efferent intravascular uptake. No intrathecal or subarachnoid spread observed. Permanent images saved into the patient's record.  Antibiotic Prophylaxis:   Anti-infectives (From admission, onward)   None     Indication(s): None identified  Post-operative Assessment:  Post-procedure Vital Signs:  Pulse/HCG Rate: 6665 Temp: 98.1 F (36.7  C) Resp: 18 BP: (!) 129/97 SpO2: 95 %  EBL: None  Complications: No immediate post-treatment complications observed by team, or reported by patient.  Note: The patient tolerated the entire procedure well. A repeat set of vitals were taken after the procedure and the patient was kept under observation following institutional policy, for this type of procedure. Post-procedural neurological assessment was performed, showing return to baseline, prior to discharge. The patient was provided with post-procedure discharge instructions, including a section on how to identify potential problems. Should any problems arise concerning this procedure, the patient was given instructions to immediately contact us, at any time, without hesitation. In any case, we plan to contact the patient by telephone for a follow-up status report regarding this interventional procedure.  Comments:  No additional relevant information. 5 out of 5 strength bilateral lower extremity: Plantar flexion, dorsiflexion, knee flexion, knee extension.  Plan of Care  Orders:   Medications ordered for procedure: Meds ordered this encounter  Medications  . iohexol (OMNIPAQUE) 180 MG/ML injection 10 mL    Must be Myelogram-compatible. If not available, you may substitute with a water-soluble, non-ionic, hypoallergenic, myelogram-compatible radiological contrast medium.  Marland Kitchen lidocaine (XYLOCAINE) 2 % (with pres) injection 400 mg  . ropivacaine (PF) 2 mg/mL (0.2%) (NAROPIN) injection 2 mL  . sodium chloride flush (NS) 0.9 % injection 2 mL  . dexamethasone (DECADRON) injection 10 mg   Medications administered: We administered iohexol, lidocaine, ropivacaine (PF) 2 mg/mL (0.2%), sodium chloride flush, and dexamethasone.  See the medical record for exact dosing, route, and time of administration.  Follow-up plan:   Return in about 5 weeks (around 03/25/2019) for Post Procedure Evaluation, virtual.       Recent Visits Date Type  Provider Dept  01/28/19 Office Visit Gillis Santa, MD [facility]  12/18/18 Office Visit Gillis Santa, MD [facility]  Showing recent visits within past 90 days and meeting all other requirements  Today's Visits Date Type Provider Dept  02/18/19 Procedure visit Gillis Santa, MD [facility]  Showing today's visits and meeting all other requirements   Future Appointments Date Type Provider Dept  03/25/19 Appointment Gillis Santa, MD [facility]  04/30/19 Appointment Gillis Santa, MD [facility]  Showing future appointments within next 90 days and meeting all other requirements   Disposition: Discharge home  Discharge Date & Time: 02/18/2019; 0940 hrs.   Primary Care Physician: Derinda Late, MD Location: [facility] Note by: Gillis Santa, MD Date: 02/18/2019; Time: 10:42 AM  Disclaimer:  Medicine is not an exact science. The only guarantee in medicine is that nothing is guaranteed. It is important to note that the decision to proceed with this intervention was based on the information collected from the patient. The Data and conclusions were drawn from the patient's questionnaire, the interview, and the physical examination. Because the information was provided in large part by the patient, it cannot be guaranteed that it has not been purposely or unconsciously manipulated. Every effort has been made to obtain as much relevant data as possible for this evaluation. It is important to note that the conclusions that lead to this procedure are derived in large part from the available data. Always take into account that the treatment will also be dependent on availability of resources and existing treatment guidelines, considered by other Pain Management Practitioners as being common knowledge and practice, at the time of the intervention. For Medico-Legal purposes, it is also important to point out that variation  in procedural techniques and pharmacological choices are the acceptable norm. The indications, contraindications, technique, and results of the above procedure should only be interpreted and judged by a Board-Certified Interventional Pain Specialist with extensive familiarity and expertise in the same exact procedure and technique.

## 2019-02-18 NOTE — Patient Instructions (Signed)
Pain Management Discharge Instructions  General Discharge Instructions :  If you need to reach your doctor call: Monday-Friday 8:00 am - 4:00 pm at [phone] or toll free [phone].  After clinic hours [phone] to have operator reach doctor.  Bring all of your medication bottles to all your appointments in the pain clinic.  To cancel or reschedule your appointment with Pain Management please remember to call 24 hours in advance to avoid a fee.  Refer to the educational materials which you have been given on: General Risks, I had my Procedure. Discharge Instructions, Post Sedation.  Post Procedure Instructions:  The drugs you were given will stay in your system until tomorrow, so for the next 24 hours you should not drive, make any legal decisions or drink any alcoholic beverages.  You may eat anything you prefer, but it is better to start with liquids then soups and crackers, and gradually work up to solid foods.  Please notify your doctor immediately if you have any unusual bleeding, trouble breathing or pain that is not related to your normal pain.  Depending on the type of procedure that was done, some parts of your body may feel week and/or numb.  This usually clears up by tonight or the next day.  Walk with the use of an assistive device or accompanied by an adult for the 24 hours.  You may use ice on the affected area for the first 24 hours.  Put ice in a Ziploc bag and cover with a towel and place against area 15 minutes on 15 minutes off.  You may switch to heat after 24 hours.Epidural Steroid Injection Patient Information  Description: The epidural space surrounds the nerves as they exit the spinal cord.  In some patients, the nerves can be compressed and inflamed by a bulging disc or a tight spinal canal (spinal stenosis).  By injecting steroids into the epidural space, we can bring irritated nerves into direct contact with a potentially helpful medication.  These  steroids act directly on the irritated nerves and can reduce swelling and inflammation which often leads to decreased pain.  Epidural steroids may be injected anywhere along the spine and from the neck to the low back depending upon the location of your pain.   After numbing the skin with local anesthetic (like Novocaine), a small needle is passed into the epidural space slowly.  You may experience a sensation of pressure while this is being done.  The entire block usually last less than 10 minutes.  Conditions which may be treated by epidural steroids:   Low back and leg pain  Neck and arm pain  Spinal stenosis  Post-laminectomy syndrome  Herpes zoster (shingles) pain  Pain from compression fractures  Preparation for the injection:  1. Do not eat any solid food or dairy products within 8 hours of your appointment.  2. You may drink clear liquids up to 3 hours before appointment.  Clear liquids include water, black coffee, juice or soda.  No milk or cream please. 3. You may take your regular medication, including pain medications, with a sip of water before your appointment  Diabetics should hold regular insulin (if taken separately) and take 1/2 normal NPH dos the morning of the procedure.  Carry some sugar containing items with you to your appointment. 4. A driver must accompany you and be prepared to drive you home after your procedure.  5. Bring all your current medications with your. 6. An IV may be inserted and   sedation may be given at the discretion of the physician.   7. A blood pressure cuff, EKG and other monitors will often be applied during the procedure.  Some patients may need to have extra oxygen administered for a short period. 8. You will be asked to provide medical information, including your allergies, prior to the procedure.  We must know immediately if you are taking blood thinners (like Coumadin/Warfarin)  Or if you are allergic to IV iodine contrast (dye). We must  know if you could possible be pregnant.  Possible side-effects:  Bleeding from needle site  Infection (rare, may require surgery)  Nerve injury (rare)  Numbness & tingling (temporary)  Difficulty urinating (rare, temporary)  Spinal headache ( a headache worse with upright posture)  Light -headedness (temporary)  Pain at injection site (several days)  Decreased blood pressure (temporary)  Weakness in arm/leg (temporary)  Pressure sensation in back/neck (temporary)  Call if you experience:  Fever/chills associated with headache or increased back/neck pain.  Headache worsened by an upright position.  New onset weakness or numbness of an extremity below the injection site  Hives or difficulty breathing (go to the emergency room)  Inflammation or drainage at the infection site  Severe back/neck pain  Any new symptoms which are concerning to you  Please note:  Although the local anesthetic injected can often make your back or neck feel good for several hours after the injection, the pain will likely return.  It takes 3-7 days for steroids to work in the epidural space.  You may not notice any pain relief for at least that one week.  If effective, we will often do a series of three injections spaced 3-6 weeks apart to maximally decrease your pain.  After the initial series, we generally will wait several months before considering a repeat injection of the same type.  If you have any questions, please call [phone] [facility] 

## 2019-02-19 ENCOUNTER — Telehealth: Payer: Self-pay

## 2019-02-19 NOTE — Telephone Encounter (Signed)
Procedure phone call. Patient states he is sore.  Instructed to put heat on it today and to call us for any further questions or concerns.

## 2019-03-23 ENCOUNTER — Encounter: Payer: Self-pay | Admitting: Student in an Organized Health Care Education/Training Program

## 2019-03-25 ENCOUNTER — Encounter: Payer: Self-pay | Admitting: Student in an Organized Health Care Education/Training Program

## 2019-03-25 ENCOUNTER — Ambulatory Visit
Payer: Medicare Other | Attending: Student in an Organized Health Care Education/Training Program | Admitting: Student in an Organized Health Care Education/Training Program

## 2019-03-25 ENCOUNTER — Other Ambulatory Visit: Payer: Self-pay

## 2019-03-25 DIAGNOSIS — G894 Chronic pain syndrome: Secondary | ICD-10-CM | POA: Diagnosis not present

## 2019-03-25 DIAGNOSIS — M47816 Spondylosis without myelopathy or radiculopathy, lumbar region: Secondary | ICD-10-CM

## 2019-03-25 DIAGNOSIS — Z981 Arthrodesis status: Secondary | ICD-10-CM | POA: Diagnosis not present

## 2019-03-25 DIAGNOSIS — M48062 Spinal stenosis, lumbar region with neurogenic claudication: Secondary | ICD-10-CM | POA: Diagnosis not present

## 2019-03-25 NOTE — Progress Notes (Signed)
Patient: Tim Miranda  Service Category: E/M  Provider: Gillis Santa, MD  DOB: Feb 27, 1946  DOS: 03/25/2019  Location: Office  MRN: [id number]  Setting: Ambulatory outpatient  Referring Provider: Derinda Late, MD  Type: Established Patient  Specialty: Interventional Pain Management  PCP: Derinda Late, MD  Location: Home  Delivery: TeleHealth     Virtual Encounter - Pain Management PROVIDER NOTE: Information contained herein reflects review and annotations entered in association with encounter. Interpretation of such information and data should be left to medically-trained personnel. Information provided to patient can be located elsewhere in the medical record under "Patient Instructions". Document created using STT-dictation technology, any transcriptional errors that may result from process are unintentional.    Contact & Pharmacy Preferred: [phone] Home: [phone] (home) Mobile: [phone] (mobile) E-mail: [email or web address]  [place], [place] [address] Phone: [phone] Fax: [phone]   Pre-screening  Tim Miranda offered "in-person" vs "virtual" encounter. He indicated preferring virtual for this encounter.   Reason COVID-19*  Social distancing based on CDC and AMA recommendations.   I contacted Burt on 03/25/2019 via telephone.      I clearly identified myself as Gillis Santa, MD. I verified that I was speaking with the correct person using two identifiers (Name: Tim Miranda, and date of birth: 1945-07-04).  This visit was completed via telephone due to the restrictions of the COVID-19 pandemic. All issues as above were discussed and addressed but no physical exam was performed. If it was felt that the patient should be evaluated in the office, they were directed there. The patient verbally consented to this visit. Patient was unable to complete an audio/visual visit  due to Technical difficulties and/or Lack of internet. Due to the catastrophic nature of the COVID-19 pandemic, this visit was done through audio contact only.  Location of the patient: home address (see Epic for details)  Location of the provider: office  Consent I sought verbal advanced consent from Tim Miranda for virtual visit interactions. I informed Tim Miranda of possible security and privacy concerns, risks, and limitations associated with providing "not-in-person" medical evaluation and management services. I also informed Tim Miranda of the availability of "in-person" appointments. Finally, I informed him that there would be a charge for the virtual visit and that he could be  personally, fully or partially, financially responsible for it. Tim Miranda expressed understanding and agreed to proceed.   Historic Elements   Tim Miranda is a 74 y.o. year old, male patient evaluated today after his last encounter by our practice on 02/19/2019. Tim Miranda  has a past medical history of Arthritis, Coronary artery disease, Degenerative disc disease, lumbar, Depression, Diabetes mellitus without complication ([facility]), GERD (gastroesophageal reflux disease), Gout, CABG, Hypercholesteremia, Hypertension, PLMD (periodic limb movement disorder), Sleep apnea, and Thyroid disease. He also  has a past surgical history that includes Back surgery; Coronary artery bypass graft; Eye surgery; Shoulder surgery; Carpal tunnel release; Colonoscopy with propofol (N/A, 09/19/2016); and Esophagogastroduodenoscopy (egd) with propofol (N/A, 09/19/2016). Tim Miranda has a current medication list which includes the following prescription(s): allopurinol, aspirin ec, carvedilol, cholecalciferol, coenzyme q10, furosemide, gabapentin, glipizide, levothyroxine, losartan, misc natural products, montelukast, multivitamins ther. w/minerals, naproxen sodium, fish oil odor-less, onglyza, pravastatin, and tramadol. He  reports that he has quit  smoking. His smoking use included cigarettes. He has a 0.75 pack-year smoking history. He has never used smokeless tobacco. He reports that  he does not drink alcohol or use drugs. Tim Miranda is allergic to isosorbide; lisinopril; and metformin and related.   HPI  Today, he is being contacted for a post-procedure assessment.  Evaluation of last interventional procedure  02/18/2019 Procedure: L5-S1 ESI  Sedation: Please see nurses note for DOS. When no sedatives are used, the analgesic levels obtained are directly associated to the effectiveness of the local anesthetics. However, when sedation is provided, the level of analgesia obtained during the initial 1 hour following the intervention, is believed to be the result of a combination of factors. These factors may include, but are not limited to: 1. The effectiveness of the local anesthetics used. 2. The effects of the analgesic(s) and/or anxiolytic(s) used. 3. The degree of discomfort experienced by the patient at the time of the procedure. 4. The patients ability and reliability in recalling and recording the events. 5. The presence and influence of possible secondary gains and/or psychosocial factors. Reported result: Relief experienced during the 1st hour after the procedure: 100 % (Ultra-Short Term Relief)            Interpretative annotation: Clinically appropriate result. Analgesia during this period is likely to be Local Anesthetic and/or IV Sedative (Analgesic/Anxiolytic) related.          Effects of local anesthetic: The analgesic effects attained during this period are directly associated to the localized infiltration of local anesthetics and therefore cary significant diagnostic value as to the etiological location, or anatomical origin, of the pain. Expected duration of relief is directly dependent on the pharmacodynamics of the local anesthetic used. Crisman-acting (4-6 hours) anesthetics used.  Reported result: Relief during the next 4 to 6  hour after the procedure: 100 % (Short-Term Relief)            Interpretative annotation: Clinically appropriate result. Analgesia during this period is likely to be Local Anesthetic-related.          Bodi-term benefit: Defined as the period of time past the expected duration of local anesthetics (1 hour for short-acting and 4-6 hours for Tim Miranda). With the possible exception of prolonged sympathetic blockade from the local anesthetics, benefits during this period are typically attributed to, or associated with, other factors such as analgesic sensory neuropraxia, antiinflammatory effects, or beneficial biochemical changes provided by agents other than the local anesthetics.  Reported result: Extended relief following procedure: 25 %(no pain down the legs) (Milillo-Term Relief)            Interpretative annotation: Clinically appropriate result. Partial relief. No permanent benefit expected. Inflammation plays a part in the etiology to the pain.          Laboratory Chemistry Profile (12 mo)  Renal: 11/22/2018: BUN 18; Creatinine, Ser 1.22  Lab Results  Component Value Date   GFRAA >60 11/22/2018   GFRNONAA 58 (L) 11/22/2018   Hepatic: No results found for requested labs within last 8760 hours. Lab Results  Component Value Date   AST 33 07/08/2016   ALT 22 07/08/2016   Other: No results found for requested labs within last 8760 hours. Note: Above Lab results reviewed.    Assessment  The primary encounter diagnosis was Lumbar facet arthropathy. Diagnoses of History of lumbar fusion (L3-L4 TLIF Nov 2017), Spinal stenosis, lumbar region, with neurogenic claudication, and Chronic pain syndrome were also pertinent to this visit.  Plan of Care  I am having Tim Miranda. Tim Miranda maintain his allopurinol, aspirin EC, carvedilol, cholecalciferol, Coenzyme Q10, levothyroxine, losartan, montelukast, pravastatin, glipiZIDE, multivitamins  ther. w/minerals, Misc Natural Products (MENS PROSTATE HEALTH FORMULA  PO), Onglyza, Fish Oil Odor-Less, furosemide, naproxen sodium, gabapentin, and traMADol.  Patient is status post L5-S1 epidural steroid injection on 02/18/2019.  He states that this injection was beneficial for his radiating my pain.  His primary pain complaint is focal low back pain in the area of his lumbar spinal fusion at L3-L4.  He finds it difficult to walk.  He states that he has been laying in bed majority of the days given his pain.  He has tried physical therapy in the past along with various medication trials.  He is currently on gabapentin, tramadol, fish oil, naproxen to help manage his pain.  I am pleased to hear that the patient is experiencing some pain relief in regards to his radicular pain.  Given his persistent low back pain, this could be related to lumbar facet arthropathy and lumbar degenerative disease.  Patient's lumbar MRI from 01/30/2019 shows significant facet arthrosis at L3-L4, L4-L5, L5-S1.  Tim Miranda has a history of greater than 3 months of moderate to severe pain which is resulted in functional impairment.  The patient has tried various conservative therapeutic options such as NSAIDs, Tylenol, muscle relaxants, physical therapy which was inadequately effective.  Patient's pain is predominantly axial with physical exam findings suggestive of facet arthropathy.Lumbar facet medial branch nerve blocks were discussed with the patient.  Risks and benefits were reviewed.  Patient would like to proceed with bilateral L3, L4, L5, S1 medial branch nerve block.  Orders:  Orders Placed This Encounter  Procedures  . L-FCT Blk (Schedule)    Standing Status:   Future    Standing Expiration Date:   04/25/2019    Scheduling Instructions:     Side: Bilateral     Level: L3-4, L4-5, & L5-S1 Facets ( L3, L4, L5, & S1 Medial Branch Nerves)     Sedation: with     Timeframe: in February    Order Specific Question:   Where will this procedure be performed?    Answer:   [facility]   Follow-up plan:   Return in about 19 days (around 04/13/2019) for B/L L3-S1 Fcts, with sedation (L3/4 TLIF).    Recent Visits Date Type Provider Dept  02/18/19 Procedure visit Gillis Santa, MD [facility]  01/28/19 Office Visit Gillis Santa, MD [facility]  Showing recent visits within past 90 days and meeting all other requirements   Today's Visits Date Type Provider Dept  03/25/19 Office Visit Gillis Santa, MD [facility]  Showing today's visits and meeting all other requirements   Future Appointments Date Type Provider Dept  04/30/19 Appointment Gillis Santa, MD [facility]  Showing future appointments within next 90 days and meeting all other requirements   I discussed the assessment and treatment plan with the patient. The patient was provided an opportunity to ask questions and all were answered. The patient agreed with the plan and demonstrated an understanding of the instructions.  Patient advised to call back or seek an in-person evaluation if the symptoms or condition worsens.  Total duration of non-face-to-face encounter: 30 minutes.  Note by: Gillis Santa, MD Date: 03/25/2019; Time: 1:59 PM

## 2019-04-16 ENCOUNTER — Ambulatory Visit: Payer: Medicare Other | Admitting: Pain Medicine

## 2019-04-29 ENCOUNTER — Telehealth: Payer: Self-pay

## 2019-04-29 NOTE — Telephone Encounter (Signed)
Pt returned the call. Please give him a call.                                       Thanks

## 2019-04-29 NOTE — Telephone Encounter (Signed)
Attempted to call patient.  No answer.

## 2019-04-30 ENCOUNTER — Ambulatory Visit
Payer: Medicare Other | Attending: Student in an Organized Health Care Education/Training Program | Admitting: Student in an Organized Health Care Education/Training Program

## 2019-04-30 ENCOUNTER — Other Ambulatory Visit: Payer: Self-pay

## 2019-04-30 ENCOUNTER — Encounter: Payer: Self-pay | Admitting: Student in an Organized Health Care Education/Training Program

## 2019-04-30 ENCOUNTER — Encounter: Payer: Non-veteran care | Admitting: Student in an Organized Health Care Education/Training Program

## 2019-04-30 VITALS — Ht 69.0 in | Wt 230.0 lb

## 2019-04-30 DIAGNOSIS — M5136 Other intervertebral disc degeneration, lumbar region: Secondary | ICD-10-CM | POA: Diagnosis not present

## 2019-04-30 DIAGNOSIS — Z981 Arthrodesis status: Secondary | ICD-10-CM

## 2019-04-30 DIAGNOSIS — M48062 Spinal stenosis, lumbar region with neurogenic claudication: Secondary | ICD-10-CM

## 2019-04-30 DIAGNOSIS — G894 Chronic pain syndrome: Secondary | ICD-10-CM

## 2019-04-30 DIAGNOSIS — M47816 Spondylosis without myelopathy or radiculopathy, lumbar region: Secondary | ICD-10-CM

## 2019-04-30 DIAGNOSIS — M51369 Other intervertebral disc degeneration, lumbar region without mention of lumbar back pain or lower extremity pain: Secondary | ICD-10-CM

## 2019-04-30 NOTE — Progress Notes (Signed)
Patient: Tim Miranda  Service Category: E/M  Provider: Bilal Lateef, MD  DOB: 11/26/1945  DOS: 04/30/2019  Location: Office  MRN: [id number]  Setting: Ambulatory outpatient  Referring Provider: Babaoff, Marcus, MD  Type: Established Patient  Specialty: Interventional Pain Management  PCP: Babaoff, Marcus, MD  Location: Home  Delivery: TeleHealth     Virtual Encounter - Pain Management PROVIDER NOTE: Information contained herein reflects review and annotations entered in association with encounter. Interpretation of such information and data should be left to medically-trained personnel. Information provided to patient can be located elsewhere in the medical record under "Patient Instructions". Document created using STT-dictation technology, any transcriptional errors that may result from process are unintentional.    Contact & Pharmacy Preferred: [phone] Home: [phone] (home) Mobile: [phone] (mobile) E-mail: [email or web address]  Tim Miranda, [place] - [address] [place] [address] Phone: [phone] Fax: [phone]   Pre-screening  Tim Miranda offered "in-person" vs "virtual" encounter. He indicated preferring virtual for this encounter.   Reason COVID-19*  Social distancing based on CDC and AMA recommendations.   I contacted Tim Miranda on 04/30/2019 via telephone.      I clearly identified myself as Bilal Lateef, MD. I verified that I was speaking with the correct person using two identifiers (Name: Tim Miranda, and date of birth: 10/06/1945).  This visit was completed via telephone due to the restrictions of the COVID-19 pandemic. All issues as above were discussed and addressed but no physical exam was performed. If it was felt that the patient should be evaluated in the office, they were directed there. The patient verbally consented to this visit. Patient was unable to complete an audio/visual visit  due to Technical difficulties and/or Lack of internet. Due to the catastrophic nature of the COVID-19 pandemic, this visit was done through audio contact only.  Location of the patient: home address (see Epic for details)  Location of the provider: office  Consent I sought verbal advanced consent from Tim Miranda for virtual visit interactions. I informed Tim Miranda of possible security and privacy concerns, risks, and limitations associated with providing "not-in-person" medical evaluation and management services. I also informed Tim Miranda of the availability of "in-person" appointments. Finally, I informed him that there would be a charge for the virtual visit and that he could be  personally, fully or partially, financially responsible for it. Tim Miranda expressed understanding and agreed to proceed.   Historic Elements   Tim Miranda is a 74 y.o. year old, male patient evaluated today after his last contact with our practice on 04/29/2019. Tim Miranda  has a past medical history of Arthritis, Coronary artery disease, Degenerative disc disease, lumbar, Depression, Diabetes mellitus without complication ([facility]), GERD (gastroesophageal reflux disease), Gout, CABG, Hypercholesteremia, Hypertension, PLMD (periodic limb movement disorder), Sleep apnea, and Thyroid disease. He also  has a past surgical history that includes Back surgery; Coronary artery bypass graft; Eye surgery; Shoulder surgery; Carpal tunnel release; Colonoscopy with propofol (N/A, 09/19/2016); and Esophagogastroduodenoscopy (egd) with propofol (N/A, 09/19/2016). Tim Miranda has a current medication list which includes the following prescription(s): allopurinol, aspirin ec, carvedilol, cholecalciferol, coenzyme q10, furosemide, gabapentin, glipizide, levothyroxine, losartan, misc natural products, montelukast, multivitamins ther. w/minerals, naproxen sodium, fish oil odor-less, onglyza, and pravastatin. He  reports that he has quit smoking. His  smoking use included cigarettes. He has a 0.75 pack-year smoking history. He has never used smokeless tobacco. He reports that he   does not drink alcohol or use drugs. Tim Miranda is allergic to isosorbide; lisinopril; and metformin and related.   HPI  Today, he is being contacted for worsening of previously known (established) problem   Patient is a pleasant 74 year old male with history of L3-L4 lumbar interbody fusion with associated right hip and right leg pain.  He is status post L5-S1 ESI on 02/18/2019 which was helpful for his radiating leg pain.  He is now endorsing more focal axial low back pain and upper buttock pain with occasional right hip pain.  Patient's lumbar MRI from 01/30/2019 shows significant facet arthrosis at L3-L4, L4-L5, L5-S1.  At last visit on 03/25/2019, lumbar facet medial branch nerve blocks were ordered however the patient states that he went to the [place] to get these done and these were then canceled by the [place].  It is unclear why this all happened as the injections should have been performed at the pain clinic.  I will reorder the injections as above for the patient's lumbar facet arthropathy and hopefully we can get them done at the [place] pain clinic.  Laboratory Chemistry Profile   Renal Lab Results  Component Value Date   BUN 18 11/22/2018   CREATININE 1.22 11/22/2018   GFRAA >60 11/22/2018   GFRNONAA 58 (L) 11/22/2018    Hepatic Lab Results  Component Value Date   AST 33 07/08/2016   ALT 22 07/08/2016   ALBUMIN 4.2 07/08/2016   ALKPHOS 45 07/08/2016    Electrolytes Lab Results  Component Value Date   NA 142 11/22/2018   K 4.6 11/22/2018   CL 105 11/22/2018   CALCIUM 9.2 11/22/2018    Bone No results found for: VD25OH, VD125OH2TOT, YP9509TO6, ZT2458KD9, 25OHVITD1, 25OHVITD2, 25OHVITD3, TESTOFREE, TESTOSTERONE  Inflammation (CRP: Acute Phase) (ESR: Chronic Phase) No results found for: CRP, ESRSEDRATE, LATICACIDVEN    Note: Above Lab results  reviewed.   Assessment  The primary encounter diagnosis was Lumbar facet arthropathy. Diagnoses of History of lumbar fusion (L3-L4 TLIF Nov 2017), Spinal stenosis, lumbar region, with neurogenic claudication, Lumbar degenerative disc disease, and Chronic pain syndrome were also pertinent to this visit.  Plan of Care  Patient is status post L5-S1 epidural steroid injection on 02/18/2019.  He states that this injection was beneficial for his radiating my pain.  His primary pain complaint is focal low back pain in the area of his lumbar spinal fusion at L3-L4.  He finds it difficult to walk.  He states that he has been laying in bed majority of the days given his pain.  He has tried physical therapy in the past along with various medication trials.  He is currently on gabapentin, tramadol, fish oil, naproxen to help manage his pain.  I am pleased to hear that the patient is experiencing some pain relief in regards to his radicular pain.  Given his persistent low back pain, this could be related to lumbar facet arthropathy and lumbar degenerative disease.  Patient's lumbar MRI from 01/30/2019 shows significant facet arthrosis at L3-L4, L4-L5, L5-S1.  Joey Lierman Calcaterra has a history of greater than 3 months of moderate to severe pain which is resulted in functional impairment.  The patient has tried various conservative therapeutic options such as NSAIDs, Tylenol, muscle relaxants, physical therapy which was inadequately effective.  Patient's pain is predominantly axial with physical exam findings suggestive of facet arthropathy.Lumbar facet medial branch nerve blocks were discussed with the patient.  Risks and benefits were reviewed.  Patient would like to  proceed with bilateral L3, L4, L5, S1 medial branch nerve block.  Mr. Conley S Bail has a current medication list which includes the following [place]-term medication(s): allopurinol, carvedilol, furosemide, gabapentin, levothyroxine, losartan, montelukast,  onglyza, and pravastatin.  Orders:  Orders Placed This Encounter  Procedures  . LUMBAR FACET(MEDIAL BRANCH NERVE BLOCK) MBNB    Standing Status:   Future    Standing Expiration Date:   05/28/2019    Scheduling Instructions:     Procedure: Lumbar facet block (AKA.: Lumbosacral medial branch nerve block)     Side: Bilateral     Level: L3-4, L4-5, & L5-S1 Facets (L3, L4, L5, & S1 Medial Branch Nerves)     Sedation: with     Timeframe: ASAA    Order Specific Question:   Where will this procedure be performed?    Answer:   [facility]   Follow-up plan:   Return in about 2 weeks (around 05/14/2019) for  B/L L3-S1 Fcts, with sedation (L3/4 TLIF). .    Recent Visits Date Type Provider Dept  03/25/19 Office Visit Lateef, Bilal, MD [facility]  02/18/19 Procedure visit Lateef, Bilal, MD [facility]  Showing recent visits within past 90 days and meeting all other requirements   Today's Visits Date Type Provider Dept  04/30/19 Office Visit Lateef, Bilal, MD [facility]  Showing today's visits and meeting all other requirements   Future Appointments No visits were found meeting these conditions.  Showing future appointments within next 90 days and meeting all other requirements   I discussed the assessment and treatment plan with the patient. The patient was provided an opportunity to ask questions and all were answered. The patient agreed with the plan and demonstrated an understanding of the instructions.  Patient advised to call back or seek an in-person evaluation if the symptoms or condition worsens.  Duration of encounter: 15minutes.  Note by: Bilal Lateef, MD Date: 04/30/2019; Time: 9:46 AM 

## 2019-10-05 ENCOUNTER — Other Ambulatory Visit
Admission: RE | Admit: 2019-10-05 | Discharge: 2019-10-05 | Disposition: A | Discharge status: Home / Self Care | Payer: Medicare Other | Source: Ambulatory Visit | Attending: Gastroenterology | Admitting: Gastroenterology

## 2020-05-05 ENCOUNTER — Other Ambulatory Visit: Payer: Self-pay

## 2020-05-05 ENCOUNTER — Encounter: Payer: Self-pay | Admitting: Student in an Organized Health Care Education/Training Program

## 2020-05-05 ENCOUNTER — Ambulatory Visit
Payer: No Typology Code available for payment source | Attending: Student in an Organized Health Care Education/Training Program | Admitting: Student in an Organized Health Care Education/Training Program

## 2020-05-05 VITALS — BP 175/73 | HR 69 | Temp 96.6°F | Resp 16 | Ht 69.0 in | Wt 230.0 lb

## 2020-05-05 DIAGNOSIS — M48062 Spinal stenosis, lumbar region with neurogenic claudication: Secondary | ICD-10-CM

## 2020-05-05 DIAGNOSIS — M5416 Radiculopathy, lumbar region: Secondary | ICD-10-CM

## 2020-05-05 DIAGNOSIS — Z981 Arthrodesis status: Secondary | ICD-10-CM

## 2020-05-05 DIAGNOSIS — G8929 Other chronic pain: Secondary | ICD-10-CM | POA: Diagnosis present

## 2020-05-05 DIAGNOSIS — G894 Chronic pain syndrome: Secondary | ICD-10-CM

## 2020-05-05 NOTE — Progress Notes (Signed)
PROVIDER NOTE: Information contained herein reflects review and annotations entered in association with encounter. Interpretation of such information and data should be left to medically-trained personnel. Information provided to patient can be located elsewhere in the medical record under "Patient Instructions". Document created using STT-dictation technology, any transcriptional errors that may result from process are unintentional.    Patient: Tim Miranda  Service Category: E/M  Provider: Gillis Santa, MD  DOB: 09-Oct-1945  DOS: 05/05/2020  Specialty: Interventional Pain Management  MRN: [id number]  Setting: Ambulatory outpatient  PCP: Tim Late, MD  Type: Established Patient    Referring Provider: Derinda Late, MD  Location: Office  Delivery: Face-to-face     HPI  [name], a 75 y.o. year old male, is here today because of his Lumbar radiculopathy [M54.16]. [name] primary complain today is Back Pain (lower)  Pertinent problems: 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 Pain Assessment: Severity of Chronic pain is reported as a 9 /10. Location: Back Lower/left upper leg. Onset: More than a month ago. Quality: Dull,Sharp. Timing: Intermittent. Modifying factor(s): sitting in recliner. Vitals:  height is 5' 9"  (1.753 m) and weight is 230 lb (104.3 kg). His temporal temperature is 96.6 F (35.9 C) (abnormal). His blood pressure is 175/73 (abnormal) and his pulse is 69. His respiration is 16 and oxygen saturation is 96%.   Reason for encounter: worsening of previously known (established) problem   [name] presents today with worsening right low back pain with radiation to his right  posterior lateral leg stopping at the lateral knee. He has a history of L3 and L4 T LIF. He has had a L5-S1 epidural steroid injection with me in the past, over 1 year ago on February 18, 2019 which did help out with his radicular pain. He experienced 70% pain relief for his right lower extremity radicular pain for approximately 3 months with gradual return of pain thereafter. He states that approximately 6 months ago he changed positions while he was sleeping and felt a acute increase in his pain going down into his right leg has struggled with increased pain, weakness of his right leg. He is also endorsing some pain of his left lateral thigh as well. At this point, I suggest repeating a lumbar epidural steroid injection at L5-S1 as we have done previously. I reviewed his lumbar MRI again from 2020 which shows adjacent segment disease as well as lumbar spondylosis and bilateral, severe neuroforaminal stenosis at L5-S1. Depending upon results, we may need to consider a repeat lumbar MRI and more spinal cord stimulation to help address his pain. Risk and benefits of lumbar ESI discussed with patient would like to proceed. He is not on any blood thinners.  ROS  Constitutional: Denies any fever or chills Gastrointestinal: No reported hemesis, hematochezia, vomiting, or acute GI distress Musculoskeletal: Low back pain with radiation to the right posterior lateral thigh Neurological: No reported episodes of acute onset apraxia, aphasia, dysarthria, agnosia, amnesia, paralysis, loss of coordination, or loss of consciousness  Medication Review  Cinnamon, Coenzyme Q10, Fish Oil, Fish Oil Odor-Less, Misc Natural Products, Thera-Gesic, acetaminophen, allopurinol, aspirin EC, carvedilol, cholecalciferol, citalopram, fluticasone, furosemide, gabapentin, glipiZIDE, insulin glargine, levothyroxine, losartan, montelukast, multivitamins ther. w/minerals, naproxen sodium, pravastatin, and saxagliptin HCl  History Review   Allergy: [name] is allergic to isosorbide, lisinopril, and metformin and related. Drug: [name]  reports no history of drug use. Alcohol:  reports no history of alcohol use. Tobacco:  reports that he has quit smoking. His smoking use included cigarettes. He has a 0.75 pack-year smoking history. He has never used smokeless tobacco. Social: [name]  reports that he has quit smoking. His smoking use included cigarettes. He has a 0.75 pack-year smoking history. He has never used smokeless tobacco. He reports that he does not drink alcohol and does not use drugs. Medical:  has a past medical history of Actinic keratosis, Arthritis, Coronary artery disease, Degenerative disc disease, lumbar, Depression, Diabetes mellitus without complication ([place]), Dysplastic nevus (10/27/2018), Dysplastic nevus (10/27/2018), GERD (gastroesophageal reflux disease), Gout, CABG, Hypercholesteremia, Hypertension, PLMD (periodic limb movement disorder), Sleep apnea, and Thyroid disease. Surgical: [name]  has a past surgical history that includes Back surgery; Coronary artery bypass graft; Eye surgery; Shoulder surgery; Carpal tunnel release; Colonoscopy with propofol (N/A, 09/19/2016); and Esophagogastroduodenoscopy (egd) with propofol (N/A, 09/19/2016). Family: family history includes Heart disease in his father and mother.  Laboratory Chemistry Profile   Renal Lab Results  Component Value Date   BUN 18 11/22/2018   CREATININE 1.22 11/22/2018   GFRAA >60 11/22/2018   GFRNONAA 58 (L) 11/22/2018     Hepatic Lab Results  Component Value Date   AST 33 07/08/2016   ALT 22 07/08/2016   ALBUMIN 4.2 07/08/2016   ALKPHOS 45 07/08/2016     Electrolytes Lab Results  Component Value Date   NA 142 11/22/2018   K 4.6 11/22/2018   CL 105 11/22/2018   CALCIUM 9.2 11/22/2018     Bone No results found for: VD25OH, VD125OH2TOT, [id number], KX3818EX9, 25OHVITD1, 25OHVITD2, 25OHVITD3, TESTOFREE, TESTOSTERONE    Inflammation (CRP: Acute Phase) (ESR: Chronic Phase) No results found for: CRP, ESRSEDRATE, LATICACIDVEN     Note: Above Lab results reviewed.  Physical Exam  General appearance: Well nourished, well developed, and well hydrated. In no apparent acute distress Mental status: Alert, oriented x 3 (person, place, & time)       Respiratory: No evidence of acute respiratory distress Eyes: PERLA Vitals: BP (!) 175/73   Pulse 69   Temp (!) 96.6 F (35.9 C) (Temporal)   Resp 16   Ht 5' 9"  (1.753 m)   Wt 230 lb (104.3 kg)   SpO2 96%   BMI 33.97 kg/m  BMI: Estimated body mass index is 33.97 kg/m as calculated from the following:   Height as of this encounter: 5' 9"  (1.753 m).   Weight as of this encounter: 230 lb (104.3 kg). Ideal: Ideal body weight: 70.7 kg (155 lb 13.8 oz) Adjusted ideal body weight: 84.2 kg (185 lb 8.3 oz)  Lumbar Spine Area Exam  Skin & Axial Inspection:Well healed scar from previous spine surgery detected Alignment:Symmetrical Functional BZJ:IRCV restricted ROMaffecting primarily the right Stability:No instability detected Muscle Tone/Strength:Functionally intact. No obvious neuro-muscular anomalies detected. Sensory (Neurological):Dermatomal pain pattern Palpation:No palpable anomalies Provocative Tests: Hyperextension/rotation test:(+)bilaterally for facet joint pain. Lumbar quadrant test (Kemp's test):(+)on the right for foraminal stenosis Lateral bending test:(+)ipsilateral radicular pain, on the right. Positive for right-sided foraminal stenosis. Patrick's Maneuver:(+)due to pain FABER* test:deferred today S-I anterior distraction/compression test:deferred today S-I lateral compression test:deferred today S-I Thigh-thrust test:deferred today S-I Gaenslen's test:deferred today *(Flexion, ABduction and External Rotation)  Gait & Posture Assessment   Ambulation:Limited Gait:Age-related, senile gait pattern Posture:Difficulty with positional changes  Lower Extremity Exam    Side:Right lower extremity  Side:Left lower extremity  Stability:Unstable  Stability:No instability observed  Skin & Extremity Inspection:Skin color, temperature, and hair growth are WNL. No peripheral edema or cyanosis. No masses, redness, swelling, asymmetry, or associated skin lesions. No contractures.  Skin & Extremity Inspection:Skin color, temperature, and hair growth are WNL. No peripheral edema or cyanosis. No masses, redness, swelling, asymmetry, or associated skin lesions. No contractures.  Functional FUW:TKTCCEQFD ROMfor hip and knee joints   Functional VOU:[id number] ROM   Muscle Tone/Strength:4/5 plantar flexion, dorsiflexion, hip flexion  Muscle Tone/Strength:5/5 plantar flexion, dorsiflexion, hip flexion  Sensory (Neurological):Dermatomal pain pattern  Sensory (Neurological):Unimpaired     Assessment   Status Diagnosis  Having a Flare-up Having a Flare-up Persistent 1. Lumbar radiculopathy   2. Spinal stenosis, lumbar region, with neurogenic claudication   3. History of lumbar fusion (L3-L4 TLIF Nov 2017)   4. Chronic radicular lumbar pain   5. 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 has a current medication list which includes the following [place]-term medication(s): allopurinol, carvedilol, citalopram, furosemide, gabapentin, insulin glargine, levothyroxine, losartan, montelukast, montelukast, onglyza, and  pravastatin.  Orders:  Orders Placed This Encounter  Procedures  . Lumbar Epidural Injection    Standing Status:   Future    Standing Expiration Date:   06/02/2020    Scheduling Instructions:     Procedure: Interlaminar Lumbar Epidural Steroid injection (LESI)            Laterality: Midline     Sedation: Patient's choice.     Timeframe: ASAA    Order Specific Question:   Where will this procedure be performed?    Answer:   [facility]   Follow-up plan:   Return in about 1 week (around 05/12/2020) for L5/S1 ESI , without sedation.   Recent Visits No visits were found meeting these conditions. Showing recent visits within past 90 days and meeting all other requirements Today's Visits Date Type Provider Dept  05/05/20 Office Visit Tim Santa, MD [facility]  Showing today's visits and meeting all other requirements Future Appointments Date Type Provider Dept  05/18/20 Appointment Tim Santa, MD [facility]  Showing future appointments within next 90 days and meeting all other requirements  I discussed the assessment and treatment plan with the patient. The patient was provided an opportunity to ask questions and all were answered. The patient agreed with the plan and demonstrated an understanding of the instructions.  Patient advised to call back or seek an in-person evaluation if the symptoms or condition worsens.  Duration of encounter: 30 minutes.  Note by: Tim Santa, MD Date: 05/05/2020; Time: 9:50 AM

## 2020-05-05 NOTE — Progress Notes (Signed)
Safety precautions to be maintained throughout the outpatient stay will include: orient to surroundings, keep bed in low position, maintain call bell within reach at all times, provide assistance with transfer out of bed and ambulation.  

## 2020-05-18 ENCOUNTER — Ambulatory Visit (HOSPITAL_BASED_OUTPATIENT_CLINIC_OR_DEPARTMENT_OTHER): Payer: Medicare Other | Admitting: Student in an Organized Health Care Education/Training Program

## 2020-05-18 ENCOUNTER — Encounter: Payer: Self-pay | Admitting: Student in an Organized Health Care Education/Training Program

## 2020-05-18 ENCOUNTER — Ambulatory Visit
Admission: RE | Admit: 2020-05-18 | Discharge: 2020-05-18 | Disposition: A | Discharge status: Home / Self Care | Payer: Medicare Other | Source: Ambulatory Visit | Attending: Student in an Organized Health Care Education/Training Program | Admitting: Student in an Organized Health Care Education/Training Program

## 2020-05-18 ENCOUNTER — Other Ambulatory Visit: Payer: Self-pay

## 2020-05-18 VITALS — BP 156/95 | HR 59 | Temp 97.3°F | Resp 20 | Ht 68.0 in | Wt 230.0 lb

## 2020-05-18 DIAGNOSIS — M5416 Radiculopathy, lumbar region: Secondary | ICD-10-CM | POA: Insufficient documentation

## 2020-05-18 DIAGNOSIS — G894 Chronic pain syndrome: Secondary | ICD-10-CM | POA: Insufficient documentation

## 2020-05-18 DIAGNOSIS — M48062 Spinal stenosis, lumbar region with neurogenic claudication: Secondary | ICD-10-CM

## 2020-05-18 MED ORDER — LIDOCAINE HCL 2 % IJ SOLN
20.0000 mL | Freq: Once | INTRAMUSCULAR | Status: AC
  Administered 2020-05-18: 100 mg

## 2020-05-18 MED ORDER — SODIUM CHLORIDE 0.9% FLUSH
2.0000 mL | Freq: Once | INTRAVENOUS | Status: AC
  Administered 2020-05-18: 10 mL

## 2020-05-18 MED ORDER — IOHEXOL 180 MG/ML  SOLN
INTRAMUSCULAR | Status: AC
  Filled 2020-05-18: qty 20

## 2020-05-18 MED ORDER — LIDOCAINE HCL (PF) 2 % IJ SOLN
INTRAMUSCULAR | Status: AC
  Filled 2020-05-18: qty 5

## 2020-05-18 MED ORDER — SODIUM CHLORIDE (PF) 0.9 % IJ SOLN
INTRAMUSCULAR | Status: AC
  Filled 2020-05-18: qty 10

## 2020-05-18 MED ORDER — DEXAMETHASONE SODIUM PHOSPHATE 10 MG/ML IJ SOLN
10.0000 mg | Freq: Once | INTRAMUSCULAR | Status: AC
  Administered 2020-05-18: 10 mg

## 2020-05-18 MED ORDER — ROPIVACAINE HCL 2 MG/ML IJ SOLN
2.0000 mL | Freq: Once | INTRAMUSCULAR | Status: AC
  Administered 2020-05-18: 10 mL via EPIDURAL

## 2020-05-18 MED ORDER — IOHEXOL 180 MG/ML  SOLN
10.0000 mL | Freq: Once | INTRAMUSCULAR | Status: AC
  Administered 2020-05-18: 10 mL via EPIDURAL

## 2020-05-18 MED ORDER — TRIAMCINOLONE ACETONIDE 40 MG/ML IJ SUSP: 40.0000 mg | Freq: Once | INTRAMUSCULAR | Status: DC

## 2020-05-18 MED ORDER — DEXAMETHASONE SODIUM PHOSPHATE 10 MG/ML IJ SOLN
INTRAMUSCULAR | Status: AC
  Filled 2020-05-18: qty 1

## 2020-05-18 MED ORDER — ROPIVACAINE HCL 2 MG/ML IJ SOLN
INTRAMUSCULAR | Status: AC
  Filled 2020-05-18: qty 10

## 2020-05-18 NOTE — Progress Notes (Signed)
Patient's Name: Tim Miranda  MRN: [id number]  Referring Provider: Derinda Late, MD  DOB: 02-05-1946  PCP: Derinda Late, MD  DOS: 05/18/2020  Note by: Gillis Santa, MD  Service setting: Ambulatory outpatient  Specialty: Interventional Pain Management  Patient type: Established  Location: [facility] (AMB) Pain Management Facility  Visit type: Interventional Procedure   Primary Reason for Visit: Interventional Pain Management Treatment. CC: Back Pain (lower)  Procedure:          Anesthesia, Analgesia, Anxiolysis:  Type: Therapeutic Inter-Laminar Epidural Steroid Injection  #2  Region: Lumbar Level: L5-S1 Level. Laterality: Midline         Type: Local Anesthesia  Local Anesthetic: Lidocaine 1-2%  Position: Prone with head of the table was raised to facilitate breathing.   Indications: 1. Lumbar radiculopathy   2. Spinal stenosis, lumbar region, with neurogenic claudication   3. Chronic pain syndrome    Pain Score: Pre-procedure: 9 /10 Post-procedure: 0-No pain/10   Pre-op Assessment:  Tim Miranda is a 75 y.o. (year old), male patient, seen today for interventional treatment. He  has a past surgical history that includes Back surgery; Coronary artery bypass graft; Eye surgery; Shoulder surgery; Carpal tunnel release; Colonoscopy with propofol (N/A, 09/19/2016); and Esophagogastroduodenoscopy (egd) with propofol (N/A, 09/19/2016). Tim Miranda has a current medication list which includes the following prescription(s): acetaminophen, allopurinol, aspirin ec, carvedilol, cholecalciferol, cinnamon, citalopram, coenzyme q10, glipizide, insulin glargine, levothyroxine, losartan, thera-gesic, misc natural products, montelukast, multivitamins ther. w/minerals, fish oil, onglyza, pravastatin, fluticasone, furosemide, gabapentin, montelukast, naproxen sodium, and fish oil odor-less. His primarily concern today is the Back Pain (lower)  Initial Vital Signs:  Pulse/HCG Rate: (!) 59ECG Heart Rate: 65 Temp: (!)  97.3 F (36.3 C) Resp: 16 BP: (!) 172/69 SpO2: 96 %  BMI: Estimated body mass index is 34.97 kg/m as calculated from the following:   Height as of this encounter: 5\' 8"  (1.727 m).   Weight as of this encounter: 230 lb (104.3 kg).  Risk Assessment: Allergies: Reviewed. He is allergic to isosorbide, lisinopril, and metformin and related.  Allergy Precautions: None required Coagulopathies: Reviewed. None identified.  Blood-thinner therapy: None at this time Active Infection(s): Reviewed. None identified. Tim Miranda is afebrile  Site Confirmation: Tim Miranda was asked to confirm the procedure and laterality before marking the site Procedure checklist: Completed Consent: Before the procedure and under the influence of no sedative(s), amnesic(s), or anxiolytics, the patient was informed of the treatment options, risks and possible complications. To fulfill our ethical and legal obligations, as recommended by the American Medical Association's Code of Ethics, I have informed the patient of my clinical impression; the nature and purpose of the treatment or procedure; the risks, benefits, and possible complications of the intervention; the alternatives, including doing nothing; the risk(s) and benefit(s) of the alternative treatment(s) or procedure(s); and the risk(s) and benefit(s) of doing nothing. The patient was provided information about the general risks and possible complications associated with the procedure. These may include, but are not limited to: failure to achieve desired goals, infection, bleeding, organ or nerve damage, allergic reactions, paralysis, and death. In addition, the patient was informed of those risks and complications associated to Spine-related procedures, such as failure to decrease pain; infection (i.e.: Meningitis, epidural or intraspinal abscess); bleeding (i.e.: epidural hematoma, subarachnoid hemorrhage, or any other type of intraspinal or peri-dural bleeding); organ or  nerve damage (i.e.: Any type of peripheral nerve, nerve root, or spinal cord injury) with subsequent damage to sensory, motor, and/or autonomic  systems, resulting in permanent pain, numbness, and/or weakness of one or several areas of the body; allergic reactions; (i.e.: anaphylactic reaction); and/or death. Furthermore, the patient was informed of those risks and complications associated with the medications. These include, but are not limited to: allergic reactions (i.e.: anaphylactic or anaphylactoid reaction(s)); adrenal axis suppression; blood sugar elevation that in diabetics may result in ketoacidosis or comma; water retention that in patients with history of congestive heart failure may result in shortness of breath, pulmonary edema, and decompensation with resultant heart failure; weight gain; swelling or edema; medication-induced neural toxicity; particulate matter embolism and blood vessel occlusion with resultant organ, and/or nervous system infarction; and/or aseptic necrosis of one or more joints. Finally, the patient was informed that Medicine is not an exact science; therefore, there is also the possibility of unforeseen or unpredictable risks and/or possible complications that may result in a catastrophic outcome. The patient indicated having understood very clearly. We have given the patient no guarantees and we have made no promises. Enough time was given to the patient to ask questions, all of which were answered to the patient's satisfaction. Tim Miranda has indicated that he wanted to continue with the procedure. Attestation: I, the ordering provider, attest that I have discussed with the patient the benefits, risks, side-effects, alternatives, likelihood of achieving goals, and potential problems during recovery for the procedure that I have provided informed consent. Date   Time: 05/18/2020  7:58 AM  Pre-Procedure Preparation:  Monitoring: As per clinic protocol. Respiration, ETCO2, SpO2,  BP, heart rate and rhythm monitor placed and checked for adequate function Safety Precautions: Patient was assessed for positional comfort and pressure points before starting the procedure. Time-out: I initiated and conducted the "Time-out" before starting the procedure, as per protocol. The patient was asked to participate by confirming the accuracy of the "Time Out" information. Verification of the correct person, site, and procedure were performed and confirmed by me, the nursing staff, and the patient. "Time-out" conducted as per Joint Commission's Universal Protocol (UP.01.01.01). Time: 0831  Description of Procedure:          Target Area: The interlaminar space, initially targeting the lower laminar border of the superior vertebral body. Approach: Paramedial approach. Area Prepped: Entire Posterior Lumbar Region Prepping solution: DuraPrep (Iodine Povacrylex [0.7% available iodine] and Isopropyl Alcohol, 74% w/w) Safety Precautions: Aspiration looking for blood return was conducted prior to all injections. At no point did we inject any substances, as a needle was being advanced. No attempts were made at seeking any paresthesias. Safe injection practices and needle disposal techniques used. Medications properly checked for expiration dates. SDV (single dose vial) medications used. Description of the Procedure: Protocol guidelines were followed. The procedure needle was introduced through the skin, ipsilateral to the reported pain, and advanced to the target area. Bone was contacted and the needle walked caudad, until the lamina was cleared. The epidural space was identified using loss-of-resistance technique with 2-3 ml of PF-NaCl (0.9% NSS), in a 5cc LOR glass syringe.  Vitals:   05/18/20 0806 05/18/20 0834 05/18/20 0839 05/18/20 0841  BP: (!) 172/69 (!) 150/80 127/76 (!) 156/95  Pulse: (!) 59     Resp: 16 20 20 20   Temp: (!) 97.3 F (36.3 C)     TempSrc: Temporal     SpO2: 96% 95% 94%  94%  Weight: 230 lb (104.3 kg)     Height: 5\' 8"  (1.727 m)       Start Time: 0831 hrs. End Time:  0840 hrs.  Materials:  Needle(s) Type: Epidural needle Gauge: 22G Length: 3.5-in Medication(s): Please see orders for medications and dosing details. 7 cc solution made of 4 cc of preservative-free saline, 2 cc of 0.2% ropivacaine, 1 cc of Decadron 10 mg/cc.  Imaging Guidance (Spinal):          Type of Imaging Technique: Fluoroscopy Guidance (Spinal) Indication(s): Assistance in needle guidance and placement for procedures requiring needle placement in or near specific anatomical locations not easily accessible without such assistance. Exposure Time: Please see nurses notes. Contrast: Before injecting any contrast, we confirmed that the patient did not have an allergy to iodine, shellfish, or radiological contrast. Once satisfactory needle placement was completed at the desired level, radiological contrast was injected. Contrast injected under live fluoroscopy. No contrast complications. See chart for type and volume of contrast used. Fluoroscopic Guidance: I was personally present during the use of fluoroscopy. "Tunnel Vision Technique" used to obtain the best possible view of the target area. Parallax error corrected before commencing the procedure. "Direction-depth-direction" technique used to introduce the needle under continuous pulsed fluoroscopy. Once target was reached, antero-posterior, oblique, and lateral fluoroscopic projection used confirm needle placement in all planes. Images permanently stored in EMR. Interpretation: I personally interpreted the imaging intraoperatively. Adequate needle placement confirmed in multiple planes. Appropriate spread of contrast into desired area was observed. No evidence of afferent or efferent intravascular uptake. No intrathecal or subarachnoid spread observed. Permanent images saved into the patient's record.   Post-operative Assessment:   Post-procedure Vital Signs:  Pulse/HCG Rate: (!) 5965 Temp: (!) 97.3 F (36.3 C) Resp: 20 BP: (!) 156/95 SpO2: 94 %  EBL: None  Complications: No immediate post-treatment complications observed by team, or reported by patient.  Note: The patient tolerated the entire procedure well. A repeat set of vitals were taken after the procedure and the patient was kept under observation following institutional policy, for this type of procedure. Post-procedural neurological assessment was performed, showing return to baseline, prior to discharge. The patient was provided with post-procedure discharge instructions, including a section on how to identify potential problems. Should any problems arise concerning this procedure, the patient was given instructions to immediately contact us, at any time, without hesitation. In any case, we plan to contact the patient by telephone for a follow-up status report regarding this interventional procedure.  Comments:  No additional relevant information. 5 out of 5 strength bilateral lower extremity: Plantar flexion, dorsiflexion, knee flexion, knee extension.  Plan of Care  Orders:   Medications ordered for procedure: Meds ordered this encounter  Medications   iohexol (OMNIPAQUE) 180 MG/ML injection 10 mL    Must be Myelogram-compatible. If not available, you may substitute with a water-soluble, non-ionic, hypoallergenic, myelogram-compatible radiological contrast medium.   lidocaine (XYLOCAINE) 2 % (with pres) injection 400 mg   ropivacaine (PF) 2 mg/mL (0.2%) (NAROPIN) injection 2 mL   sodium chloride flush (NS) 0.9 % injection 2 mL   DISCONTD: triamcinolone acetonide (KENALOG-40) injection 40 mg   dexamethasone (DECADRON) injection 10 mg   Medications administered: We administered iohexol, lidocaine, ropivacaine (PF) 2 mg/mL (0.2%), sodium chloride flush, and dexamethasone.  See the medical record for exact dosing, route, and time of  administration.  Follow-up plan:   Return 2-3, for PP F to F.       Recent Visits Date Type Provider Dept  05/05/20 Office Visit Gillis Santa, MD [facility]  Showing recent visits within past 90 days and meeting all other requirements  Today's Visits Date Type Provider Dept  05/18/20 Procedure visit Gillis Santa, MD [facility]  Showing today's visits and meeting all other requirements Future Appointments Date Type Provider Dept  06/02/20 Appointment Gillis Santa, MD [facility]  Showing future appointments within next 90 days and meeting all other requirements  Disposition: Discharge home  Discharge Date & Time: 05/18/2020; 0850 hrs.   Primary Care Physician: Derinda Late, MD Location: [facility] Note by: Gillis Santa, MD Date: 05/18/2020; Time: 9:44 AM  Disclaimer:  Medicine is not an exact science. The only guarantee in medicine is that nothing is guaranteed. It is important to note that the decision to proceed with this intervention was based on the information collected from the patient. The Data and conclusions were drawn from the patient's questionnaire, the interview, and the physical examination. Because the information was provided in large part by the patient, it cannot be guaranteed that it has not been purposely or unconsciously manipulated. Every effort has been made to obtain as much relevant data as possible for this evaluation. It is important to note that the conclusions that lead to this procedure are derived in large part from the available data. Always take into account that the treatment will also be dependent on availability of resources and existing treatment guidelines, considered by other Pain Management Practitioners as being common knowledge and practice, at the time of the intervention. For Medico-Legal purposes, it is also important to point out that variation in procedural techniques and  pharmacological choices are the acceptable norm. The indications, contraindications, technique, and results of the above procedure should only be interpreted and judged by a Board-Certified Interventional Pain Specialist with extensive familiarity and expertise in the same exact procedure and technique.

## 2020-05-18 NOTE — Progress Notes (Signed)
Safety precautions to be maintained throughout the outpatient stay will include: orient to surroundings, keep bed in low position, maintain call bell within reach at all times, provide assistance with transfer out of bed and ambulation.  

## 2020-05-18 NOTE — Patient Instructions (Signed)

## 2020-05-19 ENCOUNTER — Telehealth: Payer: Self-pay | Admitting: *Deleted

## 2020-05-19 NOTE — Telephone Encounter (Signed)
Spoke with patient re; procedure on yesterday.  States he is a little sore at insertion site, has been using ice, will f/up with heat today.

## 2020-06-02 ENCOUNTER — Other Ambulatory Visit: Payer: Self-pay

## 2020-06-02 ENCOUNTER — Ambulatory Visit
Payer: No Typology Code available for payment source | Attending: Student in an Organized Health Care Education/Training Program | Admitting: Student in an Organized Health Care Education/Training Program

## 2020-06-02 ENCOUNTER — Encounter: Payer: Self-pay | Admitting: Student in an Organized Health Care Education/Training Program

## 2020-06-02 VITALS — BP 146/85 | HR 66 | Temp 97.4°F | Resp 18 | Ht 69.0 in | Wt 230.0 lb

## 2020-06-02 DIAGNOSIS — G8929 Other chronic pain: Secondary | ICD-10-CM | POA: Diagnosis present

## 2020-06-02 DIAGNOSIS — M5416 Radiculopathy, lumbar region: Secondary | ICD-10-CM | POA: Diagnosis present

## 2020-06-02 DIAGNOSIS — M48062 Spinal stenosis, lumbar region with neurogenic claudication: Secondary | ICD-10-CM | POA: Diagnosis present

## 2020-06-02 DIAGNOSIS — G894 Chronic pain syndrome: Secondary | ICD-10-CM | POA: Diagnosis present

## 2020-06-02 DIAGNOSIS — Z981 Arthrodesis status: Secondary | ICD-10-CM

## 2020-06-02 MED ORDER — GABAPENTIN 300 MG PO CAPS: ORAL_CAPSULE | ORAL | 2 refills | Status: DC

## 2020-06-02 NOTE — Patient Instructions (Addendum)
[facility] discuss with [facility] if spinal cord stimulator would be covered as this may be a reasonable treatment option  ____________________________________________________________________________________________  Preparing for your procedure (without sedation)  Procedure appointments are limited to planned procedures: . No Prescription Refills. . No disability issues will be discussed. . No medication changes will be discussed.  Instructions: . Oral Intake: Do not eat or drink anything for at least 2 hours prior to your procedure. (Exception: Blood Pressure Medication. See below.) . Transportation: Unless otherwise stated by your physician, you may drive yourself after the procedure. . Blood Pressure Medicine: Do not forget to take your blood pressure medicine with a sip of water the morning of the procedure. If your Diastolic (lower reading)is above 100 mmHg, elective cases will be cancelled/rescheduled. . Blood thinners: These will need to be stopped for procedures. Notify our staff if you are taking any blood thinners. Depending on which one you take, there will be specific instructions on how and when to stop it. . Diabetics on insulin: Notify the staff so that you can be scheduled 1st case in the morning. If your diabetes requires high dose insulin, take only  of your normal insulin dose the morning of the procedure and notify the staff that you have done so. . Preventing infections: Shower with an antibacterial soap the morning of your procedure.  . Build-up your immune system: Take 1000 mg of Vitamin C with every meal (3 times a day) the day prior to your procedure. Marland Kitchen Antibiotics: Inform the staff if you have a condition or reason that requires you to take antibiotics before dental procedures. . Pregnancy: If you are pregnant, call and cancel the procedure. . Sickness: If you have a cold, fever, or any active infections, call and cancel the procedure. . Arrival: You must be in the facility at  least 30 minutes prior to your scheduled procedure. . Children: Do not bring any children with you. . Dress appropriately: Bring dark clothing that you would not mind if they get stained. . Valuables: Do not bring any jewelry or valuables.  Reasons to call and reschedule or cancel your procedure: (Following these recommendations will minimize the risk of a serious complication.) . Surgeries: Avoid having procedures within 2 weeks of any surgery. (Avoid for 2 weeks before or after any surgery). . Flu Shots: Avoid having procedures within 2 weeks of a flu shots or . (Avoid for 2 weeks before or after immunizations). . Barium: Avoid having a procedure within 7-10 days after having had a radiological study involving the use of radiological contrast. (Myelograms, Barium swallow or enema study). . Heart attacks: Avoid any elective procedures or surgeries for the initial 6 months after a "Myocardial Infarction" (Heart Attack). . Blood thinners: It is imperative that you stop these medications before procedures. Let us know if you if you take any blood thinner.  . Infection: Avoid procedures during or within two weeks of an infection (including chest colds or gastrointestinal problems). Symptoms associated with infections include: Localized redness, fever, chills, night sweats or profuse sweating, burning sensation when voiding, cough, congestion, stuffiness, runny nose, sore throat, diarrhea, nausea, vomiting, cold or Flu symptoms, recent or current infections. It is specially important if the infection is over the area that we intend to treat. Marland Kitchen Heart and lung problems: Symptoms that may suggest an active cardiopulmonary problem include: cough, chest pain, breathing difficulties or shortness of breath, dizziness, ankle swelling, uncontrolled high or unusually low blood pressure, and/or palpitations. If you are experiencing  any of these symptoms, cancel your procedure and contact your primary care physician for  an evaluation.  Remember:  Regular Business hours are:  Monday to Thursday 8:00 AM to 4:00 PM  Provider's Schedule: Milinda Pointer, MD:  Procedure days: Tuesday and Thursday 7:30 AM to 4:00 PM  Gillis Santa, MD:  Procedure days: Monday and Wednesday 7:30 AM to 4:00 PM ____________________________________________________________________________________________

## 2020-06-02 NOTE — Progress Notes (Signed)
PROVIDER NOTE: Information contained herein reflects review and annotations entered in association with encounter. Interpretation of such information and data should be left to medically-trained personnel. Information provided to patient can be located elsewhere in the medical record under "Patient Instructions". Document created using STT-dictation technology, any transcriptional errors that may result from process are unintentional.    Patient: Tim Miranda  Service Category: E/M  Provider: Gillis Santa, MD  DOB: 09/11/1945  DOS: 06/02/2020  Specialty: Interventional Pain Management  MRN: [id number]  Setting: Ambulatory outpatient  PCP: Derinda Late, MD  Type: Established Patient    Referring Provider: Derinda Late, MD  Location: Office  Delivery: Face-to-face     HPI  [name], a 75 y.o. year old male, is here today because of his Lumbar radiculopathy [M54.16]. [name] primary complain today is Back Pain (Right, lower) Last encounter: My last encounter with him was on 05/18/2020. Pertinent problems: 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 Pain Assessment: Severity of Chronic pain is reported as a  /10. Location: Back Right,Lower/right ribs and right upper leg. Onset: More than a month ago. Quality: Dull. Timing: Constant. Modifying factor(s): nothing. Vitals:  height is 5' 9"  (1.753 m) and weight is 230 lb (104.3 kg). His temporal temperature is 97.4 F (36.3 C) (abnormal). His blood pressure is 146/85 (abnormal) and his pulse is 66. His respiration is 18 and oxygen saturation is 98%.   Reason for encounter: post-procedure assessment.     Post-Procedure Evaluation  Procedure  (05/18/2020):  Type: Therapeutic Inter-Laminar Epidural Steroid Injection  #2  Region: Lumbar Level: L5-S1 Level. Laterality: Midline      Sedation: Please see nurses note.  Effectiveness during initial hour after procedure(Ultra-Short Term Relief): 90 %   Local anesthetic used: Kistner-acting (4-6 hours) Effectiveness: Defined as any analgesic benefit obtained secondary to the administration of local anesthetics. This carries significant diagnostic value as to the etiological location, or anatomical origin, of the pain. Duration of benefit is expected to coincide with the duration of the local anesthetic used.  Effectiveness during initial 4-6 hours after procedure(Short-Term Relief): 75 %  Chamberlain-term benefit: Defined as any relief past the pharmacologic duration of the local anesthetics.  Effectiveness past the initial 6 hours after procedure(Quirk-Term Relief): 75 % (2 weeks)  Current benefits: Defined as benefit that persist at this time.   Analgesia:  <50% better Function: Back to baseline ROM: Back to baseline   ROS  Constitutional: Denies any fever or chills Gastrointestinal: No reported hemesis, hematochezia, vomiting, or acute GI distress Musculoskeletal: Low back pain with radiation into right leg Neurological: No reported episodes of acute onset apraxia, aphasia, dysarthria, agnosia, amnesia, paralysis, loss of coordination, or loss of consciousness  Medication Review  Cinnamon, Coenzyme Q10, Fish Oil, Fish Oil Odor-Less, Misc Natural Products, Thera-Gesic, acetaminophen, allopurinol, aspirin EC, carvedilol, cholecalciferol, citalopram, fluticasone, furosemide, gabapentin, glipiZIDE, insulin glargine, levothyroxine, losartan, montelukast, multivitamins ther. w/minerals, naproxen sodium, pravastatin, and saxagliptin HCl  History Review  Allergy: [name] is allergic to isosorbide, lisinopril, and metformin and related. Drug: [name]  reports no history of drug use. Alcohol:   reports no history of alcohol use. Tobacco:  reports that he has quit smoking. His smoking use included cigarettes. He has a 0.75 pack-year  smoking history. He has never used smokeless tobacco. Social: [name]  reports that he has quit smoking. His smoking use included cigarettes. He has a 0.75 pack-year smoking history. He has never used smokeless tobacco. He reports that he does not drink alcohol and does not use drugs. Medical:  has a past medical history of Actinic keratosis, Arthritis, Coronary artery disease, Degenerative disc disease, lumbar, Depression, Diabetes mellitus without complication ([place]), Dysplastic nevus (10/27/2018), Dysplastic nevus (10/27/2018), GERD (gastroesophageal reflux disease), Gout, CABG, Hypercholesteremia, Hypertension, PLMD (periodic limb movement disorder), Sleep apnea, and Thyroid disease. Surgical: [name]  has a past surgical history that includes Back surgery; Coronary artery bypass graft; Eye surgery; Shoulder surgery; Carpal tunnel release; Colonoscopy with propofol (N/A, 09/19/2016); and Esophagogastroduodenoscopy (egd) with propofol (N/A, 09/19/2016). Family: family history includes Heart disease in his father and mother.  Laboratory Chemistry Profile   Renal Lab Results  Component Value Date   BUN 18 11/22/2018   CREATININE 1.22 11/22/2018   GFRAA >60 11/22/2018   GFRNONAA 58 (L) 11/22/2018     Hepatic Lab Results  Component Value Date   AST 33 07/08/2016   ALT 22 07/08/2016   ALBUMIN 4.2 07/08/2016   ALKPHOS 45 07/08/2016     Electrolytes Lab Results  Component Value Date   NA 142 11/22/2018   K 4.6 11/22/2018   CL 105 11/22/2018   CALCIUM 9.2 11/22/2018     Bone No results found for: VD25OH, VD125OH2TOT, OV7858IF0, YD7412IN8, 25OHVITD1, 25OHVITD2, 25OHVITD3, TESTOFREE, TESTOSTERONE   Inflammation (CRP: Acute Phase) (ESR: Chronic Phase) No results found for: CRP, ESRSEDRATE, LATICACIDVEN     Note: Above Lab results  reviewed.   Physical Exam  General appearance: Well nourished, well developed, and well hydrated. In no apparent acute distress Mental status: Alert, oriented x 3 (person, place, & time)       Respiratory: No evidence of acute respiratory distress Eyes: PERLA Vitals: BP (!) 146/85   Pulse 66   Temp (!) 97.4 F (36.3 C) (Temporal)   Resp 18   Ht 5' 9"  (1.753 m)   Wt 230 lb (104.3 kg)   SpO2 98%   BMI 33.97 kg/m  BMI: Estimated body mass index is 33.97 kg/m as calculated from the following:   Height as of this encounter: 5' 9"  (1.753 m).   Weight as of this encounter: 230 lb (104.3 kg). Ideal: Ideal body weight: 70.7 kg (155 lb 13.8 oz) Adjusted ideal body weight: 84.2 kg (185 lb 8.3 oz)  Lumbar Spine Area Exam  Skin & Axial Inspection:Well healed scar from previous spine surgery detected Alignment:Symmetrical Functional MVE:HMCN restricted ROMaffecting primarily the right Stability:No instability detected Muscle Tone/Strength:Functionally intact. No obvious neuro-muscular anomalies detected. Sensory (Neurological):Dermatomal pain pattern Palpation:No palpable anomalies Provocative Tests: Hyperextension/rotation test:(+)bilaterally for facet joint pain. Lumbar quadrant test (Kemp's test):(+)on the right for foraminal stenosis Lateral bending test:(+)ipsilateral radicular pain, on the right. Positive for right-sided foraminal stenosis. Patrick's Maneuver:(+)due to pain FABER* test:deferred today S-I anterior distraction/compression test:deferred today S-I lateral compression test:deferred today S-I Thigh-thrust test:deferred today S-I Gaenslen's test:deferred today *(Flexion, ABduction and External Rotation)  Gait & Posture Assessment  Ambulation:Limited Gait:Age-related, senile gait pattern Posture:Difficulty with positional changes  Lower Extremity Exam    Side:Right  lower extremity  Side:Left lower extremity  Stability:Unstable  Stability:No instability observed  Skin & Extremity Inspection:Skin color, temperature, and hair growth are WNL. No peripheral edema or cyanosis. No masses, redness, swelling, asymmetry, or associated skin lesions. No contractures.  Skin & Extremity  Inspection:Skin color, temperature, and hair growth are WNL. No peripheral edema or cyanosis. No masses, redness, swelling, asymmetry, or associated skin lesions. No contractures.  Functional URK:YHCWCBJSE ROMfor hip and knee joints   Functional GBT:[id number] ROM   Muscle Tone/Strength:4/5 plantar flexion, dorsiflexion, hip flexion  Muscle Tone/Strength:5/5 plantar flexion, dorsiflexion, hip flexion  Sensory (Neurological):Dermatomal pain pattern  Sensory (Neurological):Unimpaired     Assessment   Status Diagnosis  Persistent Persistent Persistent 1. Lumbar radiculopathy   2. Spinal stenosis, lumbar region, with neurogenic claudication   3. History of lumbar fusion (L3-L4 TLIF Nov 2017)   4. Chronic radicular lumbar pain   5. Chronic pain syndrome      Plan of Care   [name] has a current medication list which includes the following Wurtz-term medication(s): allopurinol, carvedilol, citalopram, insulin glargine, levothyroxine, losartan, montelukast, onglyza, pravastatin, furosemide, gabapentin, and montelukast.  Discussed neck steps which could include repeating lumbar epidural steroid injection versus considering a spinal cord stimulator trial.  We spent over 15 minutes discussing details of spinal cord stimulation trial and implant.  Patient does have appropriate interlaminar windows to consider percutaneous trial.  I informed him that if we decided to pursue this, he will need a psychological evaluation as well as thoracic MRI.  Patient states that he will think about this further and recheck to  the [place] [place] to make sure that it is covered.  I have provided the patient with resources from [place] regarding spinal cord stimulation.  He would like to repeat another lumbar epidural steroid injection as this has provided him with pain relief and functional benefit albeit for a short period of time.  I recommend he continue gabapentin as below and limit his acetaminophen to no more than 2 g daily.  Patient endorsed understanding.  Pharmacotherapy (Medications Ordered): Meds ordered this encounter  Medications  . gabapentin (NEURONTIN) 300 MG capsule    Sig: 300 mg qAM, 600 mg QHS    Dispense:  90 capsule    Refill:  2   Orders:  Orders Placed This Encounter  Procedures  . Lumbar Epidural Injection    Standing Status:   Future    Standing Expiration Date:   07/03/2020    Scheduling Instructions:     Procedure: Interlaminar Lumbar Epidural Steroid injection (LESI)            Laterality: Midline     Sedation: without     Timeframe: ASAA    Order Specific Question:   Where will this procedure be performed?    Answer:   [facility]   Follow-up plan:   Return in about 2 weeks (around 06/16/2020) for L-ESI , without sedation.   Recent Visits Date Type Provider Dept  05/18/20 Procedure visit Gillis Santa, MD [facility]  05/05/20 Office Visit Gillis Santa, MD [facility]  Showing recent visits within past 90 days and meeting all other requirements Today's Visits Date Type Provider Dept  06/02/20 Office Visit Gillis Santa, MD [facility]  Showing today's visits and meeting all other requirements Future Appointments Date Type Provider Dept  06/15/20 Appointment Gillis Santa, MD [facility]  Showing future appointments within next 90 days and meeting all other requirements  I discussed the assessment and treatment plan with the patient. The patient was provided an opportunity to ask questions and all were answered. The patient  agreed with the plan and demonstrated an understanding of the instructions.  Patient advised to call back  or seek an in-person evaluation if the symptoms or condition worsens.  Duration of encounter: 30 minutes.  Note by: Gillis Santa, MD Date: 06/02/2020; Time: 8:59 AM

## 2020-06-02 NOTE — Progress Notes (Signed)
Safety precautions to be maintained throughout the outpatient stay will include: orient to surroundings, keep bed in low position, maintain call bell within reach at all times, provide assistance with transfer out of bed and ambulation.  

## 2020-06-06 ENCOUNTER — Ambulatory Visit: Payer: Medicare Other | Admitting: Dermatology

## 2020-06-15 ENCOUNTER — Ambulatory Visit
Admission: RE | Admit: 2020-06-15 | Discharge: 2020-06-15 | Disposition: A | Discharge status: Home / Self Care | Payer: No Typology Code available for payment source | Source: Ambulatory Visit | Attending: Student in an Organized Health Care Education/Training Program | Admitting: Student in an Organized Health Care Education/Training Program

## 2020-06-15 ENCOUNTER — Ambulatory Visit (HOSPITAL_BASED_OUTPATIENT_CLINIC_OR_DEPARTMENT_OTHER)
Payer: No Typology Code available for payment source | Admitting: Student in an Organized Health Care Education/Training Program

## 2020-06-15 ENCOUNTER — Other Ambulatory Visit: Payer: Self-pay

## 2020-06-15 ENCOUNTER — Encounter: Payer: Self-pay | Admitting: Student in an Organized Health Care Education/Training Program

## 2020-06-15 VITALS — BP 135/74 | HR 60 | Temp 97.2°F | Resp 20 | Ht 69.0 in | Wt 225.0 lb

## 2020-06-15 DIAGNOSIS — M5416 Radiculopathy, lumbar region: Secondary | ICD-10-CM

## 2020-06-15 DIAGNOSIS — M5136 Other intervertebral disc degeneration, lumbar region: Secondary | ICD-10-CM | POA: Insufficient documentation

## 2020-06-15 DIAGNOSIS — G894 Chronic pain syndrome: Secondary | ICD-10-CM | POA: Insufficient documentation

## 2020-06-15 DIAGNOSIS — M961 Postlaminectomy syndrome, not elsewhere classified: Secondary | ICD-10-CM | POA: Diagnosis present

## 2020-06-15 DIAGNOSIS — G8929 Other chronic pain: Secondary | ICD-10-CM

## 2020-06-15 DIAGNOSIS — M48062 Spinal stenosis, lumbar region with neurogenic claudication: Secondary | ICD-10-CM

## 2020-06-15 MED ORDER — LIDOCAINE HCL 2 % IJ SOLN
20.0000 mL | Freq: Once | INTRAMUSCULAR | Status: AC
  Administered 2020-06-15: 400 mg
  Filled 2020-06-15: qty 40

## 2020-06-15 MED ORDER — IOHEXOL 180 MG/ML  SOLN
10.0000 mL | Freq: Once | INTRAMUSCULAR | Status: AC
  Administered 2020-06-15: 10 mL via EPIDURAL

## 2020-06-15 MED ORDER — ROPIVACAINE HCL 2 MG/ML IJ SOLN
2.0000 mL | Freq: Once | INTRAMUSCULAR | Status: AC
  Administered 2020-06-15: 2 mL via EPIDURAL
  Filled 2020-06-15: qty 10

## 2020-06-15 MED ORDER — SODIUM CHLORIDE 0.9% FLUSH
2.0000 mL | Freq: Once | INTRAVENOUS | Status: AC
  Administered 2020-06-15: 2 mL

## 2020-06-15 MED ORDER — DEXAMETHASONE SODIUM PHOSPHATE 10 MG/ML IJ SOLN
10.0000 mg | Freq: Once | INTRAMUSCULAR | Status: AC
  Administered 2020-06-15: 10 mg
  Filled 2020-06-15: qty 1

## 2020-06-15 NOTE — Progress Notes (Signed)
Safety precautions to be maintained throughout the outpatient stay will include: orient to surroundings, keep bed in low position, maintain call bell within reach at all times, provide assistance with transfer out of bed and ambulation.  

## 2020-06-15 NOTE — Progress Notes (Signed)
Patient's Name: Tim Miranda  MRN: [id number]  Referring Provider: Derinda Late, MD  DOB: [birth year]  PCP: Derinda Late, MD  DOS: 06/15/2020  Note by: Gillis Santa, MD  Service setting: Ambulatory outpatient  Specialty: Interventional Pain Management  Patient type: Established  Location: [facility] (AMB) Pain Management Facility  Visit type: Interventional Procedure   Primary Reason for Visit: Interventional Pain Management Treatment. CC: Back Pain  Procedure:          Anesthesia, Analgesia, Anxiolysis:  Type: Therapeutic Inter-Laminar Epidural Steroid Injection  #3  Region: Lumbar Level: L5-S1 Level. Laterality: Right         Type: Local Anesthesia  Local Anesthetic: Lidocaine 1-2%  Position: Prone with head of the table was raised to facilitate breathing.   Indications: 1. Lumbar radiculopathy   2. Failed back surgical syndrome   3. Spinal stenosis, lumbar region, with neurogenic claudication   4. Chronic radicular lumbar pain   5. Chronic pain syndrome   6. Other intervertebral disc degeneration, lumbar region    [name] presents today for his third lumbar epidural steroid injection.  He states that he has not been doing well and has been having increased low back and bilateral leg pain, right greater than left.  He has a history of L3-L4 transforaminal lumbar interbody fusion.  He has radiating pain to his right hip and anterior leg.  We have discussed spinal cord stimulation and have discussed a trial.  For spinal cord stimulator trial planning, we will obtain thoracic, lumbar MRI as well as psych eval.  I will go out and place orders for those today.  Patient will follow up in 3 weeks to review and hopefully get scheduled for SCS trial.   Pain Score: Pre-procedure: 10-Worst pain ever/10 Post-procedure: 5 /10   Pre-op Assessment:  [name] is a 75 y.o. (year old), male patient, seen today for interventional treatment. He  has a past surgical history that includes Back surgery;  Coronary artery bypass graft; Eye surgery; Shoulder surgery; Carpal tunnel release; Colonoscopy with propofol (N/A, 09/19/2016); and Esophagogastroduodenoscopy (egd) with propofol (N/A, 09/19/2016). [name] has a current medication list which includes the following prescription(s): acetaminophen, allopurinol, carvedilol, cholecalciferol, cinnamon, citalopram, coenzyme q10, gabapentin, glipizide, insulin glargine, levothyroxine, losartan, thera-gesic, misc natural products, montelukast, multivitamins ther. w/minerals, fish oil, onglyza, pravastatin, aspirin ec, fluticasone, furosemide, montelukast, naproxen sodium, and fish oil odor-less. His primarily concern today is the Back Pain  Initial Vital Signs:  Pulse/HCG Rate: 60ECG Heart Rate: (!) 58 Temp: (!) 97.2 F (36.2 C) Resp: 18 BP: (!) 167/82 SpO2: 96 %  BMI: Estimated body mass index is 33.23 kg/m as calculated from the following:   Height as of this encounter: 5\' 9"  (1.753 m).   Weight as of this encounter: 225 lb (102.1 kg).  Risk Assessment: Allergies: Reviewed. He is allergic to atorvastatin, isosorbide, lisinopril, and metformin and related.  Allergy Precautions: None required Coagulopathies: Reviewed. None identified.  Blood-thinner therapy: None at this time Active Infection(s): Reviewed. None identified. [name] is afebrile  Site Confirmation: [name] was asked to confirm the procedure and laterality before marking the site Procedure checklist: Completed Consent: Before the procedure and under the influence of no sedative(s), amnesic(s), or anxiolytics, the patient was informed of the treatment options, risks and possible complications. To fulfill our ethical and legal obligations, as recommended by the American Medical Association's Code of Ethics, I have informed the patient of my clinical impression; the nature and purpose of the treatment  or procedure; the risks, benefits, and possible complications of the intervention; the  alternatives, including doing nothing; the risk(s) and benefit(s) of the alternative treatment(s) or procedure(s); and the risk(s) and benefit(s) of doing nothing. The patient was provided information about the general risks and possible complications associated with the procedure. These may include, but are not limited to: failure to achieve desired goals, infection, bleeding, organ or nerve damage, allergic reactions, paralysis, and death. In addition, the patient was informed of those risks and complications associated to Spine-related procedures, such as failure to decrease pain; infection (i.e.: Meningitis, epidural or intraspinal abscess); bleeding (i.e.: epidural hematoma, subarachnoid hemorrhage, or any other type of intraspinal or peri-dural bleeding); organ or nerve damage (i.e.: Any type of peripheral nerve, nerve root, or spinal cord injury) with subsequent damage to sensory, motor, and/or autonomic systems, resulting in permanent pain, numbness, and/or weakness of one or several areas of the body; allergic reactions; (i.e.: anaphylactic reaction); and/or death. Furthermore, the patient was informed of those risks and complications associated with the medications. These include, but are not limited to: allergic reactions (i.e.: anaphylactic or anaphylactoid reaction(s)); adrenal axis suppression; blood sugar elevation that in diabetics may result in ketoacidosis or comma; water retention that in patients with history of congestive heart failure may result in shortness of breath, pulmonary edema, and decompensation with resultant heart failure; weight gain; swelling or edema; medication-induced neural toxicity; particulate matter embolism and blood vessel occlusion with resultant organ, and/or nervous system infarction; and/or aseptic necrosis of one or more joints. Finally, the patient was informed that Medicine is not an exact science; therefore, there is also the possibility of unforeseen or  unpredictable risks and/or possible complications that may result in a catastrophic outcome. The patient indicated having understood very clearly. We have given the patient no guarantees and we have made no promises. Enough time was given to the patient to ask questions, all of which were answered to the patient's satisfaction. Mr. Decelles has indicated that he wanted to continue with the procedure. Attestation: I, the ordering provider, attest that I have discussed with the patient the benefits, risks, side-effects, alternatives, likelihood of achieving goals, and potential problems during recovery for the procedure that I have provided informed consent. Date  Time: 06/15/2020  8:26 AM  Pre-Procedure Preparation:  Monitoring: As per clinic protocol. Respiration, ETCO2, SpO2, BP, heart rate and rhythm monitor placed and checked for adequate function Safety Precautions: Patient was assessed for positional comfort and pressure points before starting the procedure. Time-out: I initiated and conducted the "Time-out" before starting the procedure, as per protocol. The patient was asked to participate by confirming the accuracy of the "Time Out" information. Verification of the correct person, site, and procedure were performed and confirmed by me, the nursing staff, and the patient. "Time-out" conducted as per Joint Commission's Universal Protocol (UP.01.01.01). Time: 0910  Description of Procedure:          Target Area: The interlaminar space, initially targeting the lower laminar border of the superior vertebral body. Approach: Paramedial approach. Area Prepped: Entire Posterior Lumbar Region Prepping solution: DuraPrep (Iodine Povacrylex [0.7% available iodine] and Isopropyl Alcohol, 74% w/w) Safety Precautions: Aspiration looking for blood return was conducted prior to all injections. At no point did we inject any substances, as a needle was being advanced. No attempts were made at seeking any  paresthesias. Safe injection practices and needle disposal techniques used. Medications properly checked for expiration dates. SDV (single dose vial) medications used. Description of the  Procedure: Protocol guidelines were followed. The procedure needle was introduced through the skin, ipsilateral to the reported pain, and advanced to the target area. Bone was contacted and the needle walked caudad, until the lamina was cleared. The epidural space was identified using "loss-of-resistance technique" with 2-3 ml of PF-NaCl (0.9% NSS), in a 5cc LOR glass syringe.  Vitals:   06/15/20 0831 06/15/20 0910 06/15/20 0915  BP: (!) 167/82 (!) 140/46 135/74  Pulse: 60    Resp: 18 16 20   Temp: (!) 97.2 F (36.2 C)    TempSrc: Temporal    SpO2:  96% 95%  Weight: 225 lb (102.1 kg)    Height: 5\' 9"  (1.753 m)      Start Time: 0910 hrs. End Time: 0915 hrs.  Materials:  Needle(s) Type: Epidural needle Gauge: 22G Length: 3.5-in Medication(s): Please see orders for medications and dosing details. 7 cc solution made of 4 cc of preservative-free saline, 2 cc of 0.2% ropivacaine, 1 cc of Decadron 10 mg/cc.  Imaging Guidance (Spinal):          Type of Imaging Technique: Fluoroscopy Guidance (Spinal) Indication(s): Assistance in needle guidance and placement for procedures requiring needle placement in or near specific anatomical locations not easily accessible without such assistance. Exposure Time: Please see nurses notes. Contrast: Before injecting any contrast, we confirmed that the patient did not have an allergy to iodine, shellfish, or radiological contrast. Once satisfactory needle placement was completed at the desired level, radiological contrast was injected. Contrast injected under live fluoroscopy. No contrast complications. See chart for type and volume of contrast used. Fluoroscopic Guidance: I was personally present during the use of fluoroscopy. "Tunnel Vision Technique" used to obtain the best  possible view of the target area. Parallax error corrected before commencing the procedure. "Direction-depth-direction" technique used to introduce the needle under continuous pulsed fluoroscopy. Once target was reached, antero-posterior, oblique, and lateral fluoroscopic projection used confirm needle placement in all planes. Images permanently stored in EMR. Interpretation: I personally interpreted the imaging intraoperatively. Adequate needle placement confirmed in multiple planes. Appropriate spread of contrast into desired area was observed. No evidence of afferent or efferent intravascular uptake. No intrathecal or subarachnoid spread observed. Permanent images saved into the patient's record.   Post-operative Assessment:  Post-procedure Vital Signs:  Pulse/HCG Rate: 6060 Temp: (!) 97.2 F (36.2 C) Resp: 20 BP: 135/74 SpO2: 95 %  EBL: None  Complications: No immediate post-treatment complications observed by team, or reported by patient.  Note: The patient tolerated the entire procedure well. A repeat set of vitals were taken after the procedure and the patient was kept under observation following institutional policy, for this type of procedure. Post-procedural neurological assessment was performed, showing return to baseline, prior to discharge. The patient was provided with post-procedure discharge instructions, including a section on how to identify potential problems. Should any problems arise concerning this procedure, the patient was given instructions to immediately contact us, at any time, without hesitation. In any case, we plan to contact the patient by telephone for a follow-up status report regarding this interventional procedure.  Comments:  No additional relevant information. 5 out of 5 strength bilateral lower extremity: Plantar flexion, dorsiflexion, knee flexion, knee extension.  Plan of Care   Orders Placed This Encounter  Procedures  . DG PAIN CLINIC C-ARM 1-60 MIN  NO REPORT    Intraoperative interpretation by procedural physician at [place].    Standing Status:   Standing    Number of Occurrences:  1    Order Specific Question:   Reason for exam:    Answer:   Assistance in needle guidance and placement for procedures requiring needle placement in or near specific anatomical locations not easily accessible without such assistance.  . MR THORACIC SPINE WO CONTRAST    Patient presents with axial pain with possible radicular component.  In addition to any acute findings, please report on:  1. Facet (Zygapophyseal) joint DJD (Hypertrophy, space narrowing, subchondral sclerosis, and/or osteophyte formation) 2. DDD and/or IVDD (Loss of disc height, desiccation or "Black disc disease") 3. Pars defects 4. Spondylolisthesis, spondylosis, and/or spondyloarthropathies (include Degree/Grade of displacement in mm) 5. Vertebral body Fractures, including age (old, new/acute) 50. Modic Type Changes 7. Demineralization 8. Bone pathology 9. Central, Lateral Recess, and/or Foraminal Stenosis (include AP diameter of stenosis in mm) 10. Surgical changes (hardware type, status, and presence of fibrosis) NOTE: Please specify level(s) and laterality.    Standing Status:   Future    Standing Expiration Date:   09/14/2020    Order Specific Question:   What is the patient's sedation requirement?    Answer:   No Sedation    Order Specific Question:   Does the patient have a pacemaker or implanted devices?    Answer:   No    Order Specific Question:   Preferred imaging location?    Answer:   [facility] (table limit-300lbs)    Order Specific Question:   Call Results- Best Contact Number?    Answer:   [phone] ([facility])    Order Specific Question:   Radiology Contrast Protocol - do NOT remove file path    Answer:   \\[email or web address]  . MR LUMBAR SPINE WO CONTRAST    Patient presents with axial pain with possible  radicular component.  In addition to any acute findings, please report on:  1. Facet (Zygapophyseal) joint DJD (Hypertrophy, space narrowing, subchondral sclerosis, and/or osteophyte formation) 2. DDD and/or IVDD (Loss of disc height, desiccation or "Black disc disease") 3. Pars defects 4. Spondylolisthesis, spondylosis, and/or spondyloarthropathies (include Degree/Grade of displacement in mm) 5. Vertebral body Fractures, including age (old, new/acute) 26. Modic Type Changes 7. Demineralization 8. Bone pathology 9. Central, Lateral Recess, and/or Foraminal Stenosis (include AP diameter of stenosis in mm) 10. Surgical changes (hardware type, status, and presence of fibrosis)  NOTE: Please specify level(s) and laterality.    Standing Status:   Future    Standing Expiration Date:   09/14/2020    Order Specific Question:   What is the patient's sedation requirement?    Answer:   No Sedation    Order Specific Question:   Does the patient have a pacemaker or implanted devices?    Answer:   No    Order Specific Question:   Preferred imaging location?    Answer:   [facility] (table limit-300lbs)    Order Specific Question:   Call Results- Best Contact Number?    Answer:   [phone] ([facility])    Order Specific Question:   Radiology Contrast Protocol - do NOT remove file path    Answer:   \\[email or web address]  . Ambulatory referral to Psychology    Referral Priority:   Routine    Referral Type:   Psychiatric    Referral Reason:   Specialty Services Required    Referred to Provider:   Renaee Munda, PhD    Requested Specialty:   Psychology    Number of Visits Requested:  1     Medications ordered for procedure: Meds ordered this encounter  Medications  . iohexol (OMNIPAQUE) 180 MG/ML injection 10 mL    Must be Myelogram-compatible. If not available, you may substitute with a water-soluble, non-ionic, hypoallergenic, myelogram-compatible  radiological contrast medium.  Marland Kitchen lidocaine (XYLOCAINE) 2 % (with pres) injection 400 mg  . sodium chloride flush (NS) 0.9 % injection 2 mL  . ropivacaine (PF) 2 mg/mL (0.2%) (NAROPIN) injection 2 mL  . dexamethasone (DECADRON) injection 10 mg   Medications administered: We administered iohexol, lidocaine, sodium chloride flush, ropivacaine (PF) 2 mg/mL (0.2%), and dexamethasone.  See the medical record for exact dosing, route, and time of administration.  Follow-up plan:   Return in about 3 weeks (around 07/06/2020) for Post Procedure Evaluation +discuss SCS (after T/L MRI + psych), in person.       Recent Visits Date Type Provider Dept  06/02/20 Office Visit Gillis Santa, MD [facility]  05/18/20 Procedure visit Gillis Santa, MD [facility]  05/05/20 Office Visit Gillis Santa, MD [facility]  Showing recent visits within past 90 days and meeting all other requirements Today's Visits Date Type Provider Dept  06/15/20 Procedure visit Gillis Santa, MD [facility]  Showing today's visits and meeting all other requirements Future Appointments No visits were found meeting these conditions. Showing future appointments within next 90 days and meeting all other requirements  Disposition: Discharge home  Discharge Date & Time: 06/15/2020; 0925 hrs.   Primary Care Physician: Derinda Late, MD Location: [facility] Note by: Gillis Santa, MD Date: 06/15/2020; Time: 10:59 AM  Disclaimer:  Medicine is not an exact science. The only guarantee in medicine is that nothing is guaranteed. It is important to note that the decision to proceed with this intervention was based on the information collected from the patient. The Data and conclusions were drawn from the patient's questionnaire, the interview, and the physical examination. Because the information was provided in large part by the patient, it cannot be guaranteed that it  has not been purposely or unconsciously manipulated. Every effort has been made to obtain as much relevant data as possible for this evaluation. It is important to note that the conclusions that lead to this procedure are derived in large part from the available data. Always take into account that the treatment will also be dependent on availability of resources and existing treatment guidelines, considered by other Pain Management Practitioners as being common knowledge and practice, at the time of the intervention. For Medico-Legal purposes, it is also important to point out that variation in procedural techniques and pharmacological choices are the acceptable norm. The indications, contraindications, technique, and results of the above procedure should only be interpreted and judged by a Board-Certified Interventional Pain Specialist with extensive familiarity and expertise in the same exact procedure and technique.

## 2020-06-15 NOTE — Patient Instructions (Signed)
Pain Management Discharge Instructions  General Discharge Instructions :  If you need to reach your doctor call: Monday-Friday 8:00 am - 4:00 pm at [phone] or toll free [phone].  After clinic hours [phone] to have operator reach doctor.  Bring all of your medication bottles to all your appointments in the pain clinic.  To cancel or reschedule your appointment with Pain Management please remember to call 24 hours in advance to avoid a fee.  Refer to the educational materials which you have been given on: General Risks, I had my Procedure. Discharge Instructions, Post Sedation.  Post Procedure Instructions:  The drugs you were given will stay in your system until tomorrow, so for the next 24 hours you should not drive, make any legal decisions or drink any alcoholic beverages.  You may eat anything you prefer, but it is better to start with liquids then soups and crackers, and gradually work up to solid foods.  Please notify your doctor immediately if you have any unusual bleeding, trouble breathing or pain that is not related to your normal pain.  Depending on the type of procedure that was done, some parts of your body may feel week and/or numb.  This usually clears up by tonight or the next day.  Walk with the use of an assistive device or accompanied by an adult for the 24 hours.  You may use ice on the affected area for the first 24 hours.  Put ice in a Ziploc bag and cover with a towel and place against area 15 minutes on 15 minutes off.  You may switch to heat after 24 hours.GENERAL RISKS AND COMPLICATIONS  What are the risk, side effects and possible complications? Generally speaking, most procedures are safe.  However, with any procedure there are risks, side effects, and the possibility of complications.  The risks and complications are dependent upon the sites that are lesioned, or the type of nerve block to be performed.  The closer the procedure is to the spine,  the more serious the risks are.  Great care is taken when placing the radio frequency needles, block needles or lesioning probes, but sometimes complications can occur. 1. Infection: Any time there is an injection through the skin, there is a risk of infection.  This is why sterile conditions are used for these blocks.  There are four possible types of infection. 1. Localized skin infection. 2. Central Nervous System Infection-This can be in the form of Meningitis, which can be deadly. 3. Epidural Infections-This can be in the form of an epidural abscess, which can cause pressure inside of the spine, causing compression of the spinal cord with subsequent paralysis. This would require an emergency surgery to decompress, and there are no guarantees that the patient would recover from the paralysis. 4. Discitis-This is an infection of the intervertebral discs.  It occurs in about 1% of discography procedures.  It is difficult to treat and it may lead to surgery.        2. Pain: the needles have to go through skin and soft tissues, will cause soreness.       3. Damage to internal structures:  The nerves to be lesioned may be near blood vessels or    other nerves which can be potentially damaged.       4. Bleeding: Bleeding is more common if the patient is taking blood thinners such as  aspirin, Coumadin, Ticiid, Plavix, etc., or if he/she have some genetic predisposition  such as   hemophilia. Bleeding into the spinal canal can cause compression of the spinal  cord with subsequent paralysis.  This would require an emergency surgery to  decompress and there are no guarantees that the patient would recover from the  paralysis.       5. Pneumothorax:  Puncturing of a lung is a possibility, every time a needle is introduced in  the area of the chest or upper back.  Pneumothorax refers to free air around the  collapsed lung(s), inside of the thoracic cavity (chest cavity).  Another two possible  complications  related to a similar event would include: Hemothorax and Chylothorax.   These are variations of the Pneumothorax, where instead of air around the collapsed  lung(s), you may have blood or chyle, respectively.       6. Spinal headaches: They may occur with any procedures in the area of the spine.       7. Persistent CSF (Cerebro-Spinal Fluid) leakage: This is a rare problem, but may occur  with prolonged intrathecal or epidural catheters either due to the formation of a fistulous  track or a dural tear.       8. Nerve damage: By working so close to the spinal cord, there is always a possibility of  nerve damage, which could be as serious as a permanent spinal cord injury with  paralysis.       9. Death:  Although rare, severe deadly allergic reactions known as "Anaphylactic  reaction" can occur to any of the medications used.      10. Worsening of the symptoms:  We can always make thing worse.  What are the chances of something like this happening? Chances of any of this occuring are extremely low.  By statistics, you have more of a chance of getting killed in a motor vehicle accident: while driving to the hospital than any of the above occurring .  Nevertheless, you should be aware that they are possibilities.  In general, it is similar to taking a shower.  Everybody knows that you can slip, hit your head and get killed.  Does that mean that you should not shower again?  Nevertheless always keep in mind that statistics do not mean anything if you happen to be on the wrong side of them.  Even if a procedure has a 1 (one) in a 1,000,000 (million) chance of going wrong, it you happen to be that one..Also, keep in mind that by statistics, you have more of a chance of having something go wrong when taking medications.  Who should not have this procedure? If you are on a blood thinning medication (e.g. Coumadin, Plavix, see list of "Blood Thinners"), or if you have an active infection going on, you should not  have the procedure.  If you are taking any blood thinners, please inform your physician.  How should I prepare for this procedure?  Do not eat or drink anything at least six hours prior to the procedure.  Bring a driver with you .  It cannot be a taxi.  Come accompanied by an adult that can drive you back, and that is strong enough to help you if your legs get weak or numb from the local anesthetic.  Take all of your medicines the morning of the procedure with just enough water to swallow them.  If you have diabetes, make sure that you are scheduled to have your procedure done first thing in the morning, whenever possible.  If you have diabetes,   take only half of your insulin dose and notify our nurse that you have done so as soon as you arrive at the clinic.  If you are diabetic, but only take blood sugar pills (oral hypoglycemic), then do not take them on the morning of your procedure.  You may take them after you have had the procedure.  Do not take aspirin or any aspirin-containing medications, at least eleven (11) days prior to the procedure.  They may prolong bleeding.  Wear loose fitting clothing that may be easy to take off and that you would not mind if it got stained with Betadine or blood.  Do not wear any jewelry or perfume  Remove any nail coloring.  It will interfere with some of our monitoring equipment.  NOTE: Remember that this is not meant to be interpreted as a complete list of all possible complications.  Unforeseen problems may occur.  BLOOD THINNERS The following drugs contain aspirin or other products, which can cause increased bleeding during surgery and should not be taken for 2 weeks prior to and 1 week after surgery.  If you should need take something for relief of minor pain, you may take acetaminophen which is found in Tylenol,m Datril, Anacin-3 and Panadol. It is not blood thinner. The products listed below are.  Do not take any of the products listed below  in addition to any listed on your instruction sheet.  A.P.C or A.P.C with Codeine Codeine Phosphate Capsules #3 Ibuprofen Ridaura  ABC compound Congesprin Imuran rimadil  Advil Cope Indocin Robaxisal  Alka-Seltzer Effervescent Pain Reliever and Antacid Coricidin or Coricidin-D  Indomethacin Rufen  Alka-Seltzer plus Cold Medicine Cosprin Ketoprofen S-A-C Tablets  Anacin Analgesic Tablets or Capsules Coumadin Korlgesic Salflex  Anacin Extra Strength Analgesic tablets or capsules CP-2 Tablets Lanoril Salicylate  Anaprox Cuprimine Capsules Levenox Salocol  Anexsia-D Dalteparin Magan Salsalate  Anodynos Darvon compound Magnesium Salicylate Sine-off  Ansaid Dasin Capsules Magsal Sodium Salicylate  Anturane Depen Capsules Marnal Soma  APF Arthritis pain formula Dewitt's Pills Measurin Stanback  Argesic Dia-Gesic Meclofenamic Sulfinpyrazone  Arthritis Bayer Timed Release Aspirin Diclofenac Meclomen Sulindac  Arthritis pain formula Anacin Dicumarol Medipren Supac  Analgesic (Safety coated) Arthralgen Diffunasal Mefanamic Suprofen  Arthritis Strength Bufferin Dihydrocodeine Mepro Compound Suprol  Arthropan liquid Dopirydamole Methcarbomol with Aspirin Synalgos  ASA tablets/Enseals Disalcid Micrainin Tagament  Ascriptin Doan's Midol Talwin  Ascriptin A/D Dolene Mobidin Tanderil  Ascriptin Extra Strength Dolobid Moblgesic Ticlid  Ascriptin with Codeine Doloprin or Doloprin with Codeine Momentum Tolectin  Asperbuf Duoprin Mono-gesic Trendar  Aspergum Duradyne Motrin or Motrin IB Triminicin  Aspirin plain, buffered or enteric coated Durasal Myochrisine Trigesic  Aspirin Suppositories Easprin Nalfon Trillsate  Aspirin with Codeine Ecotrin Regular or Extra Strength Naprosyn Uracel  Atromid-S Efficin Naproxen Ursinus  Auranofin Capsules Elmiron Neocylate Vanquish  Axotal Emagrin Norgesic Verin  Azathioprine Empirin or Empirin with Codeine Normiflo Vitamin E  Azolid Emprazil Nuprin Voltaren  Bayer  Aspirin plain, buffered or children's or timed BC Tablets or powders Encaprin Orgaran Warfarin Sodium  Buff-a-Comp Enoxaparin Orudis Zorpin  Buff-a-Comp with Codeine Equegesic Os-Cal-Gesic   Buffaprin Excedrin plain, buffered or Extra Strength Oxalid   Bufferin Arthritis Strength Feldene Oxphenbutazone   Bufferin plain or Extra Strength Feldene Capsules Oxycodone with Aspirin   Bufferin with Codeine Fenoprofen Fenoprofen Pabalate or Pabalate-SF   Buffets II Flogesic Panagesic   Buffinol plain or Extra Strength Florinal or Florinal with Codeine Panwarfarin   Buf-Tabs Flurbiprofen Penicillamine   Butalbital Compound Four-way cold tablets   Penicillin   Butazolidin Fragmin Pepto-Bismol   Carbenicillin Geminisyn Percodan   Carna Arthritis Reliever Geopen Persantine   Carprofen Gold's salt Persistin   Chloramphenicol Goody's Phenylbutazone   Chloromycetin Haltrain Piroxlcam   Clmetidine heparin Plaquenil   Cllnoril Hyco-pap Ponstel   Clofibrate Hydroxy chloroquine Propoxyphen         Before stopping any of these medications, be sure to consult the physician who ordered them.  Some, such as Coumadin (Warfarin) are ordered to prevent or treat serious conditions such as "deep thrombosis", "pumonary embolisms", and other heart problems.  The amount of time that you may need off of the medication may also vary with the medication and the reason for which you were taking it.  If you are taking any of these medications, please make sure you notify your pain physician before you undergo any procedures.         Epidural Steroid Injection Patient Information  Description: The epidural space surrounds the nerves as they exit the spinal cord.  In some patients, the nerves can be compressed and inflamed by a bulging disc or a tight spinal canal (spinal stenosis).  By injecting steroids into the epidural space, we can bring irritated nerves into direct contact with a potentially helpful medication.   These steroids act directly on the irritated nerves and can reduce swelling and inflammation which often leads to decreased pain.  Epidural steroids may be injected anywhere along the spine and from the neck to the low back depending upon the location of your pain.   After numbing the skin with local anesthetic (like Novocaine), a small needle is passed into the epidural space slowly.  You may experience a sensation of pressure while this is being done.  The entire block usually last less than 10 minutes.  Conditions which may be treated by epidural steroids:   Low back and leg pain  Neck and arm pain  Spinal stenosis  Post-laminectomy syndrome  Herpes zoster (shingles) pain  Pain from compression fractures  Preparation for the injection:  1. Do not eat any solid food or dairy products within 8 hours of your appointment.  2. You may drink clear liquids up to 3 hours before appointment.  Clear liquids include water, black coffee, juice or soda.  No milk or cream please. 3. You may take your regular medication, including pain medications, with a sip of water before your appointment  Diabetics should hold regular insulin (if taken separately) and take 1/2 normal NPH dos the morning of the procedure.  Carry some sugar containing items with you to your appointment. 4. A driver must accompany you and be prepared to drive you home after your procedure.  5. Bring all your current medications with your. 6. An IV may be inserted and sedation may be given at the discretion of the physician.   7. A blood pressure cuff, EKG and other monitors will often be applied during the procedure.  Some patients may need to have extra oxygen administered for a short period. 8. You will be asked to provide medical information, including your allergies, prior to the procedure.  We must know immediately if you are taking blood thinners (like Coumadin/Warfarin)  Or if you are allergic to IV iodine contrast (dye). We  must know if you could possible be pregnant.  Possible side-effects:  Bleeding from needle site  Infection (rare, may require surgery)  Nerve injury (rare)  Numbness & tingling (temporary)  Difficulty urinating (rare, temporary)  Spinal headache (   a headache worse with upright posture)  Light -headedness (temporary)  Pain at injection site (several days)  Decreased blood pressure (temporary)  Weakness in arm/leg (temporary)  Pressure sensation in back/neck (temporary)  Call if you experience:  Fever/chills associated with headache or increased back/neck pain.  Headache worsened by an upright position.  New onset weakness or numbness of an extremity below the injection site  Hives or difficulty breathing (go to the emergency room)  Inflammation or drainage at the infection site  Severe back/neck pain  Any new symptoms which are concerning to you  Please note:  Although the local anesthetic injected can often make your back or neck feel good for several hours after the injection, the pain will likely return.  It takes 3-7 days for steroids to work in the epidural space.  You may not notice any pain relief for at least that one week.  If effective, we will often do a series of three injections spaced 3-6 weeks apart to maximally decrease your pain.  After the initial series, we generally will wait several months before considering a repeat injection of the same type.  If you have any questions, please call [phone] [facility] 

## 2020-06-16 ENCOUNTER — Telehealth: Payer: Self-pay | Admitting: *Deleted

## 2020-06-16 NOTE — Telephone Encounter (Signed)
Spoke with patient re; procedure on yesterday, no questions or concerns.

## 2020-06-28 ENCOUNTER — Other Ambulatory Visit: Payer: Self-pay

## 2020-06-28 ENCOUNTER — Ambulatory Visit
Admission: RE | Admit: 2020-06-28 | Discharge: 2020-06-28 | Disposition: A | Discharge status: Home / Self Care | Payer: No Typology Code available for payment source | Source: Ambulatory Visit | Attending: Student in an Organized Health Care Education/Training Program | Admitting: Student in an Organized Health Care Education/Training Program

## 2020-06-28 DIAGNOSIS — M5136 Other intervertebral disc degeneration, lumbar region: Secondary | ICD-10-CM

## 2020-06-28 DIAGNOSIS — M961 Postlaminectomy syndrome, not elsewhere classified: Secondary | ICD-10-CM | POA: Insufficient documentation

## 2020-06-28 DIAGNOSIS — G894 Chronic pain syndrome: Secondary | ICD-10-CM

## 2020-06-28 DIAGNOSIS — M5416 Radiculopathy, lumbar region: Secondary | ICD-10-CM

## 2020-07-01 ENCOUNTER — Telehealth: Payer: Self-pay

## 2020-07-01 NOTE — Telephone Encounter (Signed)
Patient called and left a message that he had completed his Mri and needs to make an appointment.  Please call and schedule an  Appointment.  It does not look like he has anything scheduled.

## 2020-07-11 ENCOUNTER — Ambulatory Visit (INDEPENDENT_AMBULATORY_CARE_PROVIDER_SITE_OTHER): Payer: Medicare Other | Admitting: Dermatology

## 2020-07-11 ENCOUNTER — Other Ambulatory Visit: Payer: Self-pay

## 2020-07-11 DIAGNOSIS — D229 Melanocytic nevi, unspecified: Secondary | ICD-10-CM

## 2020-07-11 DIAGNOSIS — L578 Other skin changes due to chronic exposure to nonionizing radiation: Secondary | ICD-10-CM | POA: Diagnosis not present

## 2020-07-11 DIAGNOSIS — L570 Actinic keratosis: Secondary | ICD-10-CM

## 2020-07-11 DIAGNOSIS — L820 Inflamed seborrheic keratosis: Secondary | ICD-10-CM | POA: Diagnosis not present

## 2020-07-11 DIAGNOSIS — E119 Type 2 diabetes mellitus without complications: Secondary | ICD-10-CM

## 2020-07-11 DIAGNOSIS — L821 Other seborrheic keratosis: Secondary | ICD-10-CM

## 2020-07-11 DIAGNOSIS — D1800 Hemangioma unspecified site: Secondary | ICD-10-CM

## 2020-07-11 DIAGNOSIS — Z1283 Encounter for screening for malignant neoplasm of skin: Secondary | ICD-10-CM | POA: Diagnosis not present

## 2020-07-11 DIAGNOSIS — Z86018 Personal history of other benign neoplasm: Secondary | ICD-10-CM

## 2020-07-11 DIAGNOSIS — L814 Other melanin hyperpigmentation: Secondary | ICD-10-CM

## 2020-07-11 NOTE — Patient Instructions (Signed)

## 2020-07-11 NOTE — Progress Notes (Signed)
Follow-Up Visit   Subjective  Tim Miranda is a 75 y.o. male who presents for the following: Other (Spots of scalp, ears and trunk that get scaly). The patient presents for Upper Body Skin Exam (UBSE) for skin cancer screening and mole check.  The following portions of the chart were reviewed this encounter and updated as appropriate:   Tobacco  Allergies  Meds  Problems  Med Hx  Surg Hx  Fam Hx     Review of Systems:  No other skin or systemic complaints except as noted in HPI or Assessment and Plan.  Objective  Well appearing patient in no apparent distress; mood and affect are within normal limits.  All skin waist up examined.  Objective  Back x 3: Erythematous keratotic or waxy stuck-on papule or plaque.   Objective  Right Ear sup helix x 1, scalp x 3 (4): Erythematous thin papules/macules with gritty scale.    Assessment & Plan    Lentigines - Scattered tan macules - Due to sun exposure - Benign-appering, observe - Recommend daily broad spectrum sunscreen SPF 30+ to sun-exposed areas, reapply every 2 hours as needed. - Call for any changes  Seborrheic Keratoses - Stuck-on, waxy, tan-brown papules and/or plaques  - Benign-appearing - Discussed benign etiology and prognosis. - Observe - Call for any changes  Melanocytic Nevi - Tan-brown and/or pink-flesh-colored symmetric macules and papules - Benign appearing on exam today - Observation - Call clinic for new or changing moles - Recommend daily use of broad spectrum spf 30+ sunscreen to sun-exposed areas.   Hemangiomas - Red papules - Discussed benign nature - Observe - Call for any changes  Actinic Damage - Chronic condition, secondary to cumulative UV/sun exposure - diffuse scaly erythematous macules with underlying dyspigmentation - Recommend daily broad spectrum sunscreen SPF 30+ to sun-exposed areas, reapply every 2 hours as needed.  - Staying in the shade or wearing Jeanty sleeves, sun  glasses (UVA+UVB protection) and wide brim hats (4-inch brim around the entire circumference of the hat) are also recommended for sun protection.  - Call for new or changing lesions.  Skin cancer screening performed today.  History of Dysplastic Nevi - No evidence of recurrence today - Recommend regular full body skin exams - Recommend daily broad spectrum sunscreen SPF 30+ to sun-exposed areas, reapply every 2 hours as needed.  - Call if any new or changing lesions are noted between office visits  Inflamed seborrheic keratosis Back x 3  Destruction of lesion - Back x 3 Complexity: simple   Destruction method: cryotherapy   Informed consent: discussed and consent obtained   Timeout:  patient name, date of birth, surgical site, and procedure verified Lesion destroyed using liquid nitrogen: Yes   Region frozen until ice ball extended beyond lesion: Yes   Outcome: patient tolerated procedure well with no complications   Post-procedure details: wound care instructions given    AK (actinic keratosis) (4) Right Ear sup helix x 1, scalp x 3  Destruction of lesion - Right Ear sup helix x 1, scalp x 3 Complexity: simple   Destruction method: cryotherapy   Informed consent: discussed and consent obtained   Timeout:  patient name, date of birth, surgical site, and procedure verified Lesion destroyed using liquid nitrogen: Yes   Region frozen until ice ball extended beyond lesion: Yes   Outcome: patient tolerated procedure well with no complications   Post-procedure details: wound care instructions given    Skin cancer screening  Return  in about 4 months (around 11/11/2020) for AK follow up.  I, Ashok Cordia, CMA, am acting as scribe for Sarina Ser, MD .  Documentation: I have reviewed the above documentation for accuracy and completeness, and I agree with the above.  Sarina Ser, MD

## 2020-07-12 ENCOUNTER — Ambulatory Visit
Payer: Medicare Other | Attending: Student in an Organized Health Care Education/Training Program | Admitting: Student in an Organized Health Care Education/Training Program

## 2020-07-12 ENCOUNTER — Encounter: Payer: Self-pay | Admitting: Student in an Organized Health Care Education/Training Program

## 2020-07-12 DIAGNOSIS — M961 Postlaminectomy syndrome, not elsewhere classified: Secondary | ICD-10-CM | POA: Insufficient documentation

## 2020-07-12 DIAGNOSIS — G894 Chronic pain syndrome: Secondary | ICD-10-CM | POA: Diagnosis present

## 2020-07-12 DIAGNOSIS — M48062 Spinal stenosis, lumbar region with neurogenic claudication: Secondary | ICD-10-CM | POA: Diagnosis not present

## 2020-07-12 DIAGNOSIS — M5416 Radiculopathy, lumbar region: Secondary | ICD-10-CM

## 2020-07-12 DIAGNOSIS — G8929 Other chronic pain: Secondary | ICD-10-CM | POA: Diagnosis present

## 2020-07-12 NOTE — Progress Notes (Signed)
Patient: Tim Miranda  Service Category: E/M  Provider: Gillis Santa, MD  DOB: 07-28-1945  DOS: 07/12/2020  Location: Office  MRN: [id number]  Setting: Ambulatory outpatient  Referring Provider: Derinda Late, MD  Type: Established Patient  Specialty: Interventional Pain Management  PCP: Derinda Late, MD  Location: Home  Delivery: TeleHealth     Virtual Encounter - Pain Management PROVIDER NOTE: Information contained herein reflects review and annotations entered in association with encounter. Interpretation of such information and data should be left to medically-trained personnel. Information provided to patient can be located elsewhere in the medical record under "Patient Instructions". Document created using STT-dictation technology, any transcriptional errors that may result from process are unintentional.    Contact & Pharmacy Preferred: [phone] Home: [phone] (home) Mobile: [phone] (mobile) E-mail: [email or web address]  [place], [place] [address] Phone: [phone] Fax: [phone]   Pre-screening  Tim Miranda offered "in-person" vs "virtual" encounter. He indicated preferring virtual for this encounter.   Reason COVID-19*  Social distancing based on CDC and AMA recommendations.   I contacted Tim Miranda on 07/12/2020 via video conference.      I clearly identified myself as Gillis Santa, MD. I verified that I was speaking with the correct person using two identifiers (Name: ZYSHONNE MALECHA, and date of birth: 1945-10-07).  Consent I sought verbal advanced consent from Tim Miranda for virtual visit interactions. I informed Tim Miranda of possible security and privacy concerns, risks, and limitations associated with providing "not-in-person" medical evaluation and management services. I also informed Tim Miranda of the availability of "in-person" appointments. Finally, I informed  him that there would be a charge for the virtual visit and that he could be  personally, fully or partially, financially responsible for it. Tim Miranda expressed understanding and agreed to proceed.   Historic Elements   Tim Miranda is a 75 y.o. year old, male patient evaluated today after our last contact on 06/15/2020. Tim Miranda  has a past medical history of Actinic keratosis, Arthritis, Coronary artery disease, Degenerative disc disease, lumbar, Depression, Diabetes mellitus without complication ([place]), Dysplastic nevus (10/27/2018), Dysplastic nevus (10/27/2018), GERD (gastroesophageal reflux disease), Gout, CABG, Hypercholesteremia, Hypertension, PLMD (periodic limb movement disorder), Sleep apnea, and Thyroid disease. He also  has a past surgical history that includes Back surgery; Coronary artery bypass graft; Eye surgery; Shoulder surgery; Carpal tunnel release; Colonoscopy with propofol (N/A, 09/19/2016); and Esophagogastroduodenoscopy (egd) with propofol (N/A, 09/19/2016). Tim Miranda has a current medication list which includes the following prescription(s): acetaminophen, allopurinol, aspirin ec, carvedilol, cholecalciferol, cinnamon, citalopram, fluticasone, gabapentin, glipizide, insulin glargine, levothyroxine, losartan, meloxicam, thera-gesic, misc natural products, montelukast, multivitamins ther. w/minerals, fish oil, onglyza, pravastatin, coenzyme q10, furosemide, montelukast, naproxen sodium, and fish oil odor-less. He  reports that he has quit smoking. His smoking use included cigarettes. He has a 0.75 pack-year smoking history. He has never used smokeless tobacco. He reports that he does not drink alcohol and does not use drugs. Tim Miranda is allergic to atorvastatin, isosorbide, lisinopril, and metformin and related.   HPI  Today, he is being contacted for follow-up evaluation  I contacted the patient today about his thoracic and lumbar MRI results.  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  Prior L3-L4 fusion without any residual stenosis.  He has an upcoming appointment in July with Dr. Lyman Speller for  spinal cord stimulator trial/implant psych clearance.  He did see his primary care provider in the interim and was started on meloxicam.  He is finding some benefit with that.  He states that his pain is manageable although it is still present and can flare with certain movements and positions.  I encouraged the patient to keep his appointment with Dr. Lyman Speller for psychological clearance that is needed before we consider a spinal cord stimulator trial/implant.  If things get worse I have suggested Tim Miranda to see if Dr. Lyman Speller can see him earlier.  I will see Tim Miranda back at the end of July for a face-to-face visit to see how he is doing and to further discuss spinal cord stimulation.   Laboratory Chemistry Profile   Renal Lab Results  Component Value Date   BUN 18 11/22/2018   CREATININE 1.22 11/22/2018   GFRAA >60 11/22/2018   GFRNONAA 58 (L) 11/22/2018     Hepatic Lab Results  Component Value Date   AST 33 07/08/2016   ALT 22 07/08/2016   ALBUMIN 4.2 07/08/2016   ALKPHOS 45 07/08/2016     Electrolytes Lab Results  Component Value Date   NA 142 11/22/2018   K 4.6 11/22/2018   CL 105 11/22/2018   CALCIUM 9.2 11/22/2018     Bone No results found for: VD25OH, VD125OH2TOT, ZO1096EA5, WU9811BJ4, 25OHVITD1, 25OHVITD2, 25OHVITD3, TESTOFREE, TESTOSTERONE   Inflammation (CRP: Acute Phase) (ESR: Chronic Phase) No results found for: CRP, ESRSEDRATE, LATICACIDVEN     Note: Above Lab results reviewed.  Imaging  MR THORACIC SPINE WO CONTRAST CLINICAL DATA:  Chronic low back pain. Lumbar radiculopathy. Failed back syndrome.  EXAM: MRI THORACIC AND LUMBAR SPINE WITHOUT CONTRAST  TECHNIQUE: Multiplanar and multiecho pulse sequences of the thoracic and lumbar spine were obtained without  intravenous contrast.  COMPARISON:  Lumbar spine MRI 01/30/2019  FINDINGS: MRI THORACIC SPINE FINDINGS  Alignment:  Normal.  Vertebrae: No fracture, suspicious osseous lesion, or significant marrow edema.  Cord:  Normal signal.  Paraspinal and other soft tissues: Partially visualized right renal cyst.  Disc levels:  At C6-7, a broad posterior disc protrusion results in mild spinal stenosis with mild ventral cord flattening. There is mild disc bulging from T6-7 to T10-11 with a small left paracentral/subarticular disc extrusion also noted at T10-11, however there is no associated spinal stenosis or spinal cord mass effect. There is mild left neural foraminal stenosis at T10-11.  MRI LUMBAR SPINE FINDINGS  Segmentation:  Standard.  Alignment: Mild lumbar levoscoliosis. Unchanged trace retrolisthesis of L2 on L3.  Vertebrae: No fracture, suspicious osseous lesion, or significant marrow edema. Previous posterior and interbody fusion at L3-4.  Conus medullaris and cauda equina: Conus extends to the L1-2 level. Conus and cauda equina appear normal.  Paraspinal and other soft tissues: Postoperative changes in the posterior lumbar soft tissues. No fluid collection. Slightly increased size of a right renal cyst, now 4.8 cm.  Disc levels:  Disc desiccation throughout the lumbar spine with moderate disc space narrowing at L4-5 and mild narrowing elsewhere.  L1-2 negative.  L2-3: Circumferential disc bulging and moderate facet and ligamentum flavum hypertrophy result in moderate spinal stenosis and mild-to-moderate right and mild left neural foraminal stenosis, not significantly changed.  L3-4: Prior posterior decompression and fusion.  No stenosis.  L4-5: Circumferential disc bulging and moderate facet and ligamentum flavum hypertrophy result in moderate spinal stenosis and moderate right greater than left neural foraminal stenosis, unchanged.  L5-S1: Disc bulging  and  moderate left greater than right facet hypertrophy result in moderate bilateral neural foraminal stenosis without spinal stenosis, unchanged.  IMPRESSION: 1. Unchanged lumbar disc and facet degeneration with moderate spinal stenosis at L2-3 and L4-5. 2. Moderate neural foraminal stenosis at L4-5 and L5-S1. 3. Prior L3-4 fusion without residual stenosis. 4. Mild spinal stenosis at C6-7. 5. Mild thoracic disc degeneration without spinal stenosis.  Electronically Signed   By: Logan Bores M.D.   On: 06/28/2020 12:53 MR LUMBAR SPINE WO CONTRAST CLINICAL DATA:  Chronic low back pain. Lumbar radiculopathy. Failed back syndrome.  EXAM: MRI THORACIC AND LUMBAR SPINE WITHOUT CONTRAST  TECHNIQUE: Multiplanar and multiecho pulse sequences of the thoracic and lumbar spine were obtained without intravenous contrast.  COMPARISON:  Lumbar spine MRI 01/30/2019  FINDINGS: MRI THORACIC SPINE FINDINGS  Alignment:  Normal.  Vertebrae: No fracture, suspicious osseous lesion, or significant marrow edema.  Cord:  Normal signal.  Paraspinal and other soft tissues: Partially visualized right renal cyst.  Disc levels:  At C6-7, a broad posterior disc protrusion results in mild spinal stenosis with mild ventral cord flattening. There is mild disc bulging from T6-7 to T10-11 with a small left paracentral/subarticular disc extrusion also noted at T10-11, however there is no associated spinal stenosis or spinal cord mass effect. There is mild left neural foraminal stenosis at T10-11.  MRI LUMBAR SPINE FINDINGS  Segmentation:  Standard.  Alignment: Mild lumbar levoscoliosis. Unchanged trace retrolisthesis of L2 on L3.  Vertebrae: No fracture, suspicious osseous lesion, or significant marrow edema. Previous posterior and interbody fusion at L3-4.  Conus medullaris and cauda equina: Conus extends to the L1-2 level. Conus and cauda equina appear normal.  Paraspinal and other soft  tissues: Postoperative changes in the posterior lumbar soft tissues. No fluid collection. Slightly increased size of a right renal cyst, now 4.8 cm.  Disc levels:  Disc desiccation throughout the lumbar spine with moderate disc space narrowing at L4-5 and mild narrowing elsewhere.  L1-2 negative.  L2-3: Circumferential disc bulging and moderate facet and ligamentum flavum hypertrophy result in moderate spinal stenosis and mild-to-moderate right and mild left neural foraminal stenosis, not significantly changed.  L3-4: Prior posterior decompression and fusion.  No stenosis.  L4-5: Circumferential disc bulging and moderate facet and ligamentum flavum hypertrophy result in moderate spinal stenosis and moderate right greater than left neural foraminal stenosis, unchanged.  L5-S1: Disc bulging and moderate left greater than right facet hypertrophy result in moderate bilateral neural foraminal stenosis without spinal stenosis, unchanged.  IMPRESSION: 1. Unchanged lumbar disc and facet degeneration with moderate spinal stenosis at L2-3 and L4-5. 2. Moderate neural foraminal stenosis at L4-5 and L5-S1. 3. Prior L3-4 fusion without residual stenosis. 4. Mild spinal stenosis at C6-7. 5. Mild thoracic disc degeneration without spinal stenosis.  Electronically Signed   By: Logan Bores M.D.   On: 06/28/2020 12:53 Reviewed in detail with patient. Assessment  The primary encounter diagnosis was Lumbar radiculopathy. Diagnoses of Failed back surgical syndrome, Spinal stenosis, lumbar region, with neurogenic claudication, Chronic radicular lumbar pain, and Chronic pain syndrome were also pertinent to this visit.  Plan of Care    Follow-up end of July face-to-face after he has completed psychological evaluation to further discuss spinal cord stimulator trial for failed back surgical syndrome, chronic lumbar radicular pain.  Continue Mobic as prescribed by PCP.  Follow-up plan:   Return  in about 12 weeks (around 10/04/2020) for After Psychological evaluation to discuss SCS trial.   Recent Visits Date Type  Provider Dept  06/15/20 Procedure visit Gillis Santa, MD [facility]  06/02/20 Office Visit Gillis Santa, MD [facility]  05/18/20 Procedure visit Gillis Santa, MD [facility]  05/05/20 Office Visit Gillis Santa, MD [facility]  Showing recent visits within past 90 days and meeting all other requirements Today's Visits Date Type Provider Dept  07/12/20 Telemedicine Gillis Santa, MD [facility]  Showing today's visits and meeting all other requirements Future Appointments No visits were found meeting these conditions. Showing future appointments within next 90 days and meeting all other requirements  I discussed the assessment and treatment plan with the patient. The patient was provided an opportunity to ask questions and all were answered. The patient agreed with the plan and demonstrated an understanding of the instructions.  Patient advised to call back or seek an in-person evaluation if the symptoms or condition worsens.  Duration of encounter: 20 minutes.  Note by: Gillis Santa, MD Date: 07/12/2020; Time: 2:00 PM

## 2020-07-13 ENCOUNTER — Encounter: Payer: Self-pay | Admitting: Dermatology

## 2020-09-15 ENCOUNTER — Ambulatory Visit (HOSPITAL_BASED_OUTPATIENT_CLINIC_OR_DEPARTMENT_OTHER): Payer: Medicare Other | Admitting: Student in an Organized Health Care Education/Training Program

## 2020-09-15 ENCOUNTER — Telehealth: Payer: Self-pay | Admitting: Student in an Organized Health Care Education/Training Program

## 2020-09-15 DIAGNOSIS — M5416 Radiculopathy, lumbar region: Secondary | ICD-10-CM

## 2020-09-15 MED ORDER — GABAPENTIN 300 MG PO CAPS: ORAL_CAPSULE | ORAL | 2 refills | Status: AC

## 2020-09-15 NOTE — Progress Notes (Signed)
Patient has decided to delay spinal cord stimulation at this time.  He has not completed his psych eval yet.  He is requesting a refill of his gabapentin.  I will take care of that.  He is instructed to follow-up after he has completed his psych eval to pursue spinal cord stimulation.  Requested Prescriptions   Signed Prescriptions Disp Refills   gabapentin (NEURONTIN) 300 MG capsule 90 capsule 2    Sig: 300 mg qAM, 600 mg QHS

## 2020-09-15 NOTE — Telephone Encounter (Signed)
Patient has been doing ok with the gabapentin. He needs refill on gabapentin. Would like his script to be 90 day if possible.  His pcp also started him on Meloxicam which is helping.  Has not had the Psych eval for SCS. Wants to hold off on this.please let him know when he can pick up meds. Thank you

## 2020-09-15 NOTE — Telephone Encounter (Signed)
Dr Holley Raring will send in script./

## 2020-09-26 ENCOUNTER — Other Ambulatory Visit: Payer: Self-pay

## 2020-09-26 ENCOUNTER — Inpatient Hospital Stay
Admission: EM | Admit: 2020-09-26 | Discharge: 2020-09-28 | DRG: 066 | Disposition: A | Discharge status: Home / Self Care | Payer: Medicare Other | Attending: Internal Medicine | Admitting: Internal Medicine

## 2020-09-26 ENCOUNTER — Emergency Department: Payer: Medicare Other

## 2020-09-26 DIAGNOSIS — I63412 Cerebral infarction due to embolism of left middle cerebral artery: Principal | ICD-10-CM | POA: Diagnosis present

## 2020-09-26 DIAGNOSIS — I6389 Other cerebral infarction: Secondary | ICD-10-CM

## 2020-09-26 DIAGNOSIS — Z7982 Long term (current) use of aspirin: Secondary | ICD-10-CM

## 2020-09-26 DIAGNOSIS — Z7984 Long term (current) use of oral hypoglycemic drugs: Secondary | ICD-10-CM

## 2020-09-26 DIAGNOSIS — G894 Chronic pain syndrome: Secondary | ICD-10-CM | POA: Diagnosis not present

## 2020-09-26 DIAGNOSIS — Z79899 Other long term (current) drug therapy: Secondary | ICD-10-CM

## 2020-09-26 DIAGNOSIS — E7800 Pure hypercholesterolemia, unspecified: Secondary | ICD-10-CM | POA: Diagnosis present

## 2020-09-26 DIAGNOSIS — M109 Gout, unspecified: Secondary | ICD-10-CM | POA: Diagnosis not present

## 2020-09-26 DIAGNOSIS — R29810 Facial weakness: Secondary | ICD-10-CM | POA: Diagnosis present

## 2020-09-26 DIAGNOSIS — Z791 Long term (current) use of non-steroidal anti-inflammatories (NSAID): Secondary | ICD-10-CM

## 2020-09-26 DIAGNOSIS — Z8249 Family history of ischemic heart disease and other diseases of the circulatory system: Secondary | ICD-10-CM

## 2020-09-26 DIAGNOSIS — Z79890 Hormone replacement therapy (postmenopausal): Secondary | ICD-10-CM

## 2020-09-26 DIAGNOSIS — I6521 Occlusion and stenosis of right carotid artery: Secondary | ICD-10-CM | POA: Diagnosis present

## 2020-09-26 DIAGNOSIS — I10 Essential (primary) hypertension: Secondary | ICD-10-CM | POA: Diagnosis not present

## 2020-09-26 DIAGNOSIS — E785 Hyperlipidemia, unspecified: Secondary | ICD-10-CM

## 2020-09-26 DIAGNOSIS — Z20822 Contact with and (suspected) exposure to covid-19: Secondary | ICD-10-CM | POA: Diagnosis present

## 2020-09-26 DIAGNOSIS — I2510 Atherosclerotic heart disease of native coronary artery without angina pectoris: Secondary | ICD-10-CM | POA: Diagnosis present

## 2020-09-26 DIAGNOSIS — I63512 Cerebral infarction due to unspecified occlusion or stenosis of left middle cerebral artery: Secondary | ICD-10-CM

## 2020-09-26 DIAGNOSIS — G4730 Sleep apnea, unspecified: Secondary | ICD-10-CM | POA: Diagnosis present

## 2020-09-26 DIAGNOSIS — F419 Anxiety disorder, unspecified: Secondary | ICD-10-CM | POA: Diagnosis present

## 2020-09-26 DIAGNOSIS — E1165 Type 2 diabetes mellitus with hyperglycemia: Secondary | ICD-10-CM | POA: Diagnosis present

## 2020-09-26 DIAGNOSIS — R0602 Shortness of breath: Secondary | ICD-10-CM

## 2020-09-26 DIAGNOSIS — E039 Hypothyroidism, unspecified: Secondary | ICD-10-CM

## 2020-09-26 DIAGNOSIS — G8321 Monoplegia of upper limb affecting right dominant side: Secondary | ICD-10-CM | POA: Diagnosis present

## 2020-09-26 DIAGNOSIS — F32A Depression, unspecified: Secondary | ICD-10-CM | POA: Diagnosis present

## 2020-09-26 DIAGNOSIS — E782 Mixed hyperlipidemia: Secondary | ICD-10-CM | POA: Diagnosis not present

## 2020-09-26 DIAGNOSIS — I639 Cerebral infarction, unspecified: Secondary | ICD-10-CM | POA: Diagnosis present

## 2020-09-26 DIAGNOSIS — R4701 Aphasia: Secondary | ICD-10-CM | POA: Diagnosis present

## 2020-09-26 DIAGNOSIS — Z888 Allergy status to other drugs, medicaments and biological substances status: Secondary | ICD-10-CM

## 2020-09-26 DIAGNOSIS — I4510 Unspecified right bundle-branch block: Secondary | ICD-10-CM | POA: Diagnosis present

## 2020-09-26 DIAGNOSIS — E1140 Type 2 diabetes mellitus with diabetic neuropathy, unspecified: Secondary | ICD-10-CM | POA: Diagnosis present

## 2020-09-26 DIAGNOSIS — E119 Type 2 diabetes mellitus without complications: Secondary | ICD-10-CM

## 2020-09-26 DIAGNOSIS — Z87891 Personal history of nicotine dependence: Secondary | ICD-10-CM

## 2020-09-26 DIAGNOSIS — Z951 Presence of aortocoronary bypass graft: Secondary | ICD-10-CM

## 2020-09-26 DIAGNOSIS — Z794 Long term (current) use of insulin: Secondary | ICD-10-CM

## 2020-09-26 DIAGNOSIS — R29701 NIHSS score 1: Secondary | ICD-10-CM | POA: Diagnosis present

## 2020-09-26 LAB — PROTIME-INR
INR: 1.1 (ref 0.8–1.2)
Prothrombin Time: 14.1 seconds (ref 11.4–15.2)

## 2020-09-26 LAB — CBC
HCT: 43.6 % (ref 39.0–52.0)
Hemoglobin: 15.3 g/dL (ref 13.0–17.0)
MCH: 32.1 pg (ref 26.0–34.0)
MCHC: 35.1 g/dL (ref 30.0–36.0)
MCV: 91.4 fL (ref 80.0–100.0)
Platelets: 284 10*3/uL (ref 150–400)
RBC: 4.77 MIL/uL (ref 4.22–5.81)
RDW: 13 % (ref 11.5–15.5)
WBC: 10.4 10*3/uL (ref 4.0–10.5)
nRBC: 0 % (ref 0.0–0.2)

## 2020-09-26 LAB — COMPREHENSIVE METABOLIC PANEL
ALT: 32 U/L (ref 0–44)
AST: 31 U/L (ref 15–41)
Albumin: 4.5 g/dL (ref 3.5–5.0)
Alkaline Phosphatase: 43 U/L (ref 38–126)
Anion gap: 11 (ref 5–15)
BUN: 17 mg/dL (ref 8–23)
CO2: 24 mmol/L (ref 22–32)
Calcium: 9.8 mg/dL (ref 8.9–10.3)
Chloride: 103 mmol/L (ref 98–111)
Creatinine, Ser: 0.87 mg/dL (ref 0.61–1.24)
GFR, Estimated: >60 mL/min (ref >60)
Glucose, Bld: 97 mg/dL (ref 70–99)
Potassium: 4 mmol/L (ref 3.5–5.1)
Sodium: 138 mmol/L (ref 135–145)
Total Bilirubin: 0.6 mg/dL (ref 0.3–1.2)
Total Protein: 7.4 g/dL (ref 6.5–8.1)

## 2020-09-26 LAB — DIFFERENTIAL
Abs Immature Granulocytes: 0.04 10*3/uL (ref 0.00–0.07)
Basophils Absolute: 0.1 10*3/uL (ref 0.0–0.1)
Basophils Relative: 1 %
Eosinophils Absolute: 0.3 10*3/uL (ref 0.0–0.5)
Eosinophils Relative: 3 %
Immature Granulocytes: 0 %
Lymphocytes Relative: 33 %
Lymphs Abs: 3.4 10*3/uL (ref 0.7–4.0)
Monocytes Absolute: 1.2 10*3/uL — ABNORMAL HIGH (ref 0.1–1.0)
Monocytes Relative: 12 %
Neutro Abs: 5.3 10*3/uL (ref 1.7–7.7)
Neutrophils Relative %: 51 %

## 2020-09-26 LAB — RESP PANEL BY RT-PCR (FLU A&B, COVID) ARPGX2
Influenza A by PCR: NEGATIVE
Influenza B by PCR: NEGATIVE
SARS Coronavirus 2 by RT PCR: NEGATIVE

## 2020-09-26 LAB — APTT: aPTT: 31 seconds (ref 24–36)

## 2020-09-26 LAB — GLUCOSE, CAPILLARY: Glucose-Capillary: 91 mg/dL (ref 70–99)

## 2020-09-26 MED ORDER — ADULT MULTIVITAMIN W/MINERALS CH
1.0000 | ORAL_TABLET | Freq: Every day | ORAL | Status: DC
  Administered 2020-09-27 – 2020-09-28 (×2): 1 via ORAL
  Filled 2020-09-26 (×2): qty 1

## 2020-09-26 MED ORDER — CITALOPRAM HYDROBROMIDE 20 MG PO TABS
10.0000 mg | ORAL_TABLET | Freq: Every day | ORAL | Status: DC
  Administered 2020-09-27 – 2020-09-28 (×2): 10 mg via ORAL
  Filled 2020-09-26 (×2): qty 1

## 2020-09-26 MED ORDER — ACETAMINOPHEN 650 MG RE SUPP: 650.0000 mg | RECTAL | Status: DC | PRN

## 2020-09-26 MED ORDER — CARVEDILOL 6.25 MG PO TABS: 12.5000 mg | ORAL_TABLET | Freq: Two times a day (BID) | ORAL | Status: DC

## 2020-09-26 MED ORDER — CARVEDILOL 6.25 MG PO TABS
12.5000 mg | ORAL_TABLET | Freq: Two times a day (BID) | ORAL | Status: DC
  Administered 2020-09-27: 08:00:00 12.5 mg via ORAL
  Filled 2020-09-26 (×2): qty 2

## 2020-09-26 MED ORDER — GABAPENTIN 300 MG PO CAPS
300.0000 mg | ORAL_CAPSULE | Freq: Every day | ORAL | Status: DC
  Administered 2020-09-27 – 2020-09-28 (×2): 300 mg via ORAL
  Filled 2020-09-26 (×2): qty 1

## 2020-09-26 MED ORDER — INSULIN ASPART 100 UNIT/ML IJ SOLN
0.0000 [IU] | Freq: Every day | INTRAMUSCULAR | Status: DC
  Filled 2020-09-26: qty 1

## 2020-09-26 MED ORDER — ACETAMINOPHEN 325 MG PO TABS: 650.0000 mg | ORAL_TABLET | ORAL | Status: DC | PRN

## 2020-09-26 MED ORDER — ACETAMINOPHEN 160 MG/5ML PO SOLN
650.0000 mg | ORAL | Status: DC | PRN
  Filled 2020-09-26: qty 20.3

## 2020-09-26 MED ORDER — MONTELUKAST SODIUM 10 MG PO TABS
10.0000 mg | ORAL_TABLET | Freq: Every day | ORAL | Status: DC
  Administered 2020-09-26 – 2020-09-27 (×2): 10 mg via ORAL
  Filled 2020-09-26 (×3): qty 1

## 2020-09-26 MED ORDER — PRAVASTATIN SODIUM 20 MG PO TABS
80.0000 mg | ORAL_TABLET | Freq: Every day | ORAL | Status: DC
  Administered 2020-09-26 – 2020-09-27 (×2): 80 mg via ORAL
  Filled 2020-09-26: qty 2
  Filled 2020-09-26 (×2): qty 4

## 2020-09-26 MED ORDER — VITAMIN D3 25 MCG (1000 UNIT) PO TABS
2000.0000 [IU] | ORAL_TABLET | Freq: Every day | ORAL | Status: DC
  Administered 2020-09-27 – 2020-09-28 (×2): 2000 [IU] via ORAL
  Filled 2020-09-26 (×4): qty 2

## 2020-09-26 MED ORDER — SENNOSIDES-DOCUSATE SODIUM 8.6-50 MG PO TABS: 1.0000 | ORAL_TABLET | Freq: Every evening | ORAL | Status: DC | PRN

## 2020-09-26 MED ORDER — LABETALOL HCL 5 MG/ML IV SOLN: 5.0000 mg | INTRAVENOUS | Status: DC | PRN

## 2020-09-26 MED ORDER — INSULIN GLARGINE 100 UNIT/ML ~~LOC~~ SOLN
10.0000 [IU] | Freq: Every day | SUBCUTANEOUS | Status: DC
  Administered 2020-09-27 – 2020-09-28 (×2): 10 [IU] via SUBCUTANEOUS
  Filled 2020-09-26 (×3): qty 0.1

## 2020-09-26 MED ORDER — FLUTICASONE PROPIONATE 50 MCG/ACT NA SUSP
2.0000 | Freq: Every day | NASAL | Status: DC
  Administered 2020-09-27 – 2020-09-28 (×2): 2 via NASAL
  Filled 2020-09-26: qty 16

## 2020-09-26 MED ORDER — ASPIRIN 81 MG PO CHEW
324.0000 mg | CHEWABLE_TABLET | Freq: Once | ORAL | Status: AC
  Administered 2020-09-26: 324 mg via ORAL
  Filled 2020-09-26: qty 4

## 2020-09-26 MED ORDER — INSULIN ASPART 100 UNIT/ML IJ SOLN
0.0000 [IU] | Freq: Three times a day (TID) | INTRAMUSCULAR | Status: DC
  Administered 2020-09-27: 13:00:00 5 [IU] via SUBCUTANEOUS
  Administered 2020-09-27 (×2): 3 [IU] via SUBCUTANEOUS
  Administered 2020-09-28: 12:00:00 5 [IU] via SUBCUTANEOUS
  Administered 2020-09-28: 3 [IU] via SUBCUTANEOUS
  Filled 2020-09-26 (×4): qty 1

## 2020-09-26 MED ORDER — SODIUM CHLORIDE 0.9% FLUSH
3.0000 mL | Freq: Once | INTRAVENOUS | Status: AC
  Administered 2020-09-26: 3 mL via INTRAVENOUS

## 2020-09-26 MED ORDER — ASPIRIN EC 81 MG PO TBEC
81.0000 mg | DELAYED_RELEASE_TABLET | Freq: Every day | ORAL | Status: DC
  Administered 2020-09-27 – 2020-09-28 (×2): 81 mg via ORAL
  Filled 2020-09-26 (×2): qty 1

## 2020-09-26 MED ORDER — STROKE: EARLY STAGES OF RECOVERY BOOK: Freq: Once | Status: AC

## 2020-09-26 MED ORDER — LOSARTAN POTASSIUM 50 MG PO TABS
100.0000 mg | ORAL_TABLET | Freq: Every day | ORAL | Status: DC
  Administered 2020-09-27: 08:00:00 100 mg via ORAL
  Filled 2020-09-26: qty 2

## 2020-09-26 MED ORDER — GABAPENTIN 300 MG PO CAPS
600.0000 mg | ORAL_CAPSULE | Freq: Every day | ORAL | Status: DC
  Administered 2020-09-26 – 2020-09-27 (×2): 600 mg via ORAL
  Filled 2020-09-26 (×2): qty 2

## 2020-09-26 MED ORDER — LEVOTHYROXINE SODIUM 50 MCG PO TABS
50.0000 ug | ORAL_TABLET | Freq: Every day | ORAL | Status: DC
  Administered 2020-09-27 – 2020-09-28 (×2): 50 ug via ORAL
  Filled 2020-09-26 (×2): qty 1

## 2020-09-26 MED ORDER — LABETALOL HCL 5 MG/ML IV SOLN
5.0000 mg | INTRAVENOUS | Status: DC | PRN
Start: 2020-09-26 — End: 2020-09-26

## 2020-09-26 MED ORDER — ALLOPURINOL 100 MG PO TABS
300.0000 mg | ORAL_TABLET | Freq: Every day | ORAL | Status: DC
  Administered 2020-09-27 – 2020-09-28 (×2): 300 mg via ORAL
  Filled 2020-09-26 (×2): qty 3

## 2020-09-26 NOTE — ED Notes (Signed)
Admitting at bedside 

## 2020-09-26 NOTE — ED Notes (Signed)
Report received from Jennifer, RN

## 2020-09-26 NOTE — Plan of Care (Signed)

## 2020-09-26 NOTE — ED Notes (Signed)
Pt ambulatory to RR unassisted.

## 2020-09-26 NOTE — ED Provider Notes (Signed)
[facility] Emergency Department Provider Note ____________________________________________   Event Date/Time   First MD Initiated Contact with Patient 09/26/20 1823     (approximate)  I have reviewed the triage vital signs and the nursing notes.  HISTORY  Chief Complaint Aphasia   HPI Tim Miranda is a 75 y.o. malewho presents to the ED for evaluation of aphasia  Chart review indicates HTN, HLD, DM, CAD s/p CABG. ASA81 is only thinner.   Patient presents for evaluation of about 5 days of word finding difficulty, difficulty expressing himself, confusion and gait change.  Symptoms of been persistent for the past 5 days.  Saw his PCP few days ago who was concerned about organophosphate poisoning, but due to his persistent symptoms and expressive aphasia, concern for stroke and he was sent to the ED for evaluation.  Denies fevers or recent illnesses, trauma or syncope.  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 Mild atypia, limited margins free.   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 to spine above waistline. Moderate atypia, limited margins free.    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/2020   Hyperlipidemia 09/26/2020   Gout 09/26/2020   Lumbar facet arthropathy 04/30/2019   History of lumbar fusion (L3-L4 TLIF Nov 2017) 12/18/2018   Chronic radicular lumbar pain (R  L3/4) 12/18/2018   Lumbar radiculopathy 12/18/2018   Spinal stenosis, lumbar region, 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   Lumbar degenerative disc disease 12/18/2018   Chronic pain syndrome 12/18/2018   Spinal stenosis of lumbosacral region 12/18/2018   Chest pain 11/21/2018    Past Surgical History:  Procedure Laterality Date   BACK SURGERY     CARPAL TUNNEL RELEASE     COLONOSCOPY WITH PROPOFOL N/A 09/19/2016   Procedure: COLONOSCOPY WITH PROPOFOL;  Surgeon: Manya Silvas, MD;  Location[facility] ENDOSCOPY;  Service: Endoscopy;  Laterality: N/A;   CORONARY ARTERY BYPASS GRAFT     ESOPHAGOGASTRODUODENOSCOPY (EGD) WITH PROPOFOL N/A 09/19/2016   Procedure: ESOPHAGOGASTRODUODENOSCOPY (EGD) WITH PROPOFOL;  Surgeon: Manya Silvas, MD;  Location: [facility] ENDOSCOPY;  Service: Endoscopy;  Laterality: N/A;   EYE SURGERY     SHOULDER SURGERY      Prior to Admission medications   Medication Sig Start Date End Date Taking? Authorizing Provider  allopurinol (ZYLOPRIM) 300 MG tablet Take 300 mg by mouth daily.   Yes [provider]  aspirin EC 81 MG tablet Take 81 mg by mouth daily.   Yes [provider]  carvedilol (COREG) 12.5 MG tablet Take 12.5 mg by mouth 2 (two) times daily.    Yes [provider]  cholecalciferol (VITAMIN D) 25 MCG (1000 UT) tablet Take 2,000 Units by mouth in the morning and at bedtime.   Yes [provider]  CINNAMON PO Take  1,000 mg by mouth daily.   Yes [provider]  citalopram (CELEXA) 20 MG tablet TAKE ONE-HALF TABLET BY MOUTH AT BEDTIME FOR ANXIETY 04/12/20  Yes [provider]  Coenzyme Q10 100 MG capsule Take 100 mg by mouth daily.    Yes [provider]  gabapentin (NEURONTIN) 300 MG capsule 300 mg qAM, 600 mg QHS 09/15/20  Yes Lateef, Bilal, MD  glipiZIDE (GLUCOTROL) 10 MG tablet Take 5 mg by mouth 2 (two) times daily before a meal.   Yes [provider]  insulin glargine (LANTUS) 100  UNIT/ML injection 10 Units. 04/08/20  Yes [provider]  levothyroxine (SYNTHROID, LEVOTHROID) 50 MCG tablet Take 50 mcg by mouth daily before breakfast.   Yes [provider]  losartan (COZAAR) 50 MG tablet Take 100 mg by mouth daily.   Yes [provider]  meloxicam (MOBIC) 15 MG tablet Take by mouth. 04/08/20  Yes [provider]  montelukast (SINGULAIR) 10 MG tablet Take 1 tablet by mouth at bedtime. 04/08/20  Yes [provider]  Multiple Vitamins-Minerals (MULTIVITAMINS THER. W/MINERALS) TABS tablet Take 1 tablet by mouth daily.   Yes [provider]  Omega-3 Fatty Acids (FISH OIL) 1000 MG CAPS Take by mouth 2 (two) times daily.   Yes [provider]  ONGLYZA 5 MG TABS tablet Take 5 mg by mouth daily. 11/06/18  Yes [provider]  pravastatin (PRAVACHOL) 80 MG tablet Take 80 mg by mouth at bedtime.    Yes [provider]  acetaminophen (TYLENOL) 500 MG tablet Take 500 mg by mouth in the morning and at bedtime.    [provider]  fluticasone (FLONASE) 50 MCG/ACT nasal spray INSTILL 1 SPRAY INTO EACH NOSTRIL TWO TIMES A DAY MAXIMUM 2 SPRAYS IN EACH NOSTRIL DAILY. FOR ALLERGIES 04/08/20   [provider]  furosemide (LASIX) 40 MG tablet Take 0.5 tablets (20 mg total) by mouth daily as needed (leg swelling or weight gain of 3lb or more). 11/22/18 11/22/19  Mayo, Pete Pelt, MD  Menthol-Methyl Salicylate (THERA-GESIC) 0.5-15 % CREA APPLY SMALL AMOUNT TOPICALLY FOUR TIMES A DAY AS NEEDED FOR PAIN ( AVOID FACE & EYES, WASH HANDS AFTER USING). FOR JOINT PAIN 08/25/19   [provider]  Misc Natural Products ([place]) Take 1 tablet by mouth daily.    [provider]  naproxen sodium (ALEVE) 220 MG tablet Take 220 mg by mouth. Patient not taking: No sig reported    [provider]  Omega-3 Fatty Acids (FISH OIL ODOR-LESS) 1200 MG CAPS Take 3,600 mg by mouth at  bedtime. Patient not taking: No sig reported    [provider]    Allergies Atorvastatin, Isosorbide, Lisinopril, and Metformin and related  Family History  Problem Relation Age of Onset   Heart disease Mother    Heart disease Father     Social History Social History   Tobacco Use   Smoking status: Former    Packs/day: 0.25    Years: 3.00    Pack years: 0.75    Types: Cigarettes   Smokeless tobacco: Never  Vaping Use   Vaping Use: Never used  Substance Use Topics   Alcohol use: No   Drug use: No    Review of Systems  Constitutional: No fever/chills Eyes: No visual changes. ENT: No sore throat. Cardiovascular: Denies chest pain. Respiratory: Denies shortness of breath. Gastrointestinal: No abdominal pain.  No nausea, no vomiting.  No diarrhea.  No  constipation. Genitourinary: Negative for dysuria. Musculoskeletal: Negative for back pain. Skin: Negative for rash. Neurological: Negative for headaches, Positive for new expressive aphasia and gait change  ____________________________________________   PHYSICAL EXAM:  VITAL SIGNS: Vitals:   09/26/20 2138 09/26/20 2232  BP: (!) 178/88 (!) 176/96  Pulse: 62 (!) 56  Resp: 20 18  Temp:  97.6 F (36.4 C)  SpO2: 96% 98%     Constitutional: Alert and oriented. Well appearing and in no acute distress. Eyes: Conjunctivae are normal. PERRL. EOMI. Head: Atraumatic. Nose: No congestion/rhinnorhea. Mouth/Throat: Mucous membranes are moist.  Oropharynx non-erythematous. Neck: No stridor. No cervical spine tenderness to palpation. Cardiovascular: Normal rate, regular rhythm. Grossly normal heart sounds.  Good peripheral circulation. Respiratory: Normal respiratory effort.  No retractions. Lungs CTAB. Gastrointestinal: Soft , nondistended, nontender to palpation. No CVA tenderness. Musculoskeletal: No lower extremity tenderness nor edema.  No joint effusions. No signs of acute trauma. Neurologic: Expressive  aphasia and some difficulty getting his words out and associated frustration.  Perhaps some mild dysmetria on the right side and slight wavering of his leg with leg elevation and hip strength testing. Skin:  Skin is warm, dry and intact. No rash noted. Psychiatric: Mood and affect are normal. Speech and behavior are normal.  ____________________________________________   LABS (all labs ordered are listed, but only abnormal results are displayed)  Labs Reviewed  DIFFERENTIAL - Abnormal; Notable for the following components:      Result Value   Monocytes Absolute 1.2 (*)    All other components within normal limits  RESP PANEL BY RT-PCR (FLU A&B, COVID) ARPGX2  PROTIME-INR  APTT  CBC  COMPREHENSIVE METABOLIC PANEL  GLUCOSE, CAPILLARY  HEMOGLOBIN A1C  LIPID PANEL  BASIC METABOLIC PANEL  CBC  VITAMIN B12  TSH  CBG MONITORING, ED  I-STAT CREATININE, ED   ____________________________________________  12 Lead EKG  Sinus rhythm, rate 67 bpm.  Normal axis.  Right bundle.  No STEMI. ____________________________________________  RADIOLOGY  ED MD interpretation: CT head reviewed by me with left parietal stroke  Official radiology report(s): CT HEAD WO CONTRAST  Result Date: 09/26/2020 CLINICAL DATA:  Aphasia confusion EXAM: CT HEAD WITHOUT CONTRAST TECHNIQUE: Contiguous axial images were obtained from the base of the skull through the vertex without intravenous contrast. COMPARISON:  CT brain 07/08/2016 FINDINGS: Brain: Hypodensity within the left parietal lobe consistent with acute to subacute infarct. No hemorrhage, significant mass effect or midline shift. Atrophy. Nonenlarged ventricles Vascular: No hyperdense vessels.  Carotid vascular calcification Skull: Normal. Negative for fracture or focal lesion. Sinuses/Orbits: No acute finding. Mucous retention cysts in the left maxillary sinus Other: None IMPRESSION: 1. Focal hypodensity within the left parietal lobe consistent with acute  to subacute infarct. Negative for hemorrhage. 2. Atrophy Electronically Signed   By: Donavan Foil M.D.   On: 09/26/2020 17:23    ____________________________________________   PROCEDURES and INTERVENTIONS  Procedure(s) performed (including Critical Care):  Procedures  Medications  losartan (COZAAR) tablet 100 mg (has no administration in time range)  pravastatin (PRAVACHOL) tablet 80 mg (80 mg Oral Given 09/26/20 2255)  citalopram (CELEXA) tablet 10 mg (has no administration in time range)  insulin glargine (LANTUS) injection 10 Units (has no administration in time range)  levothyroxine (SYNTHROID) tablet 50 mcg (has no administration in time range)  gabapentin (NEURONTIN) capsule 300 mg (has no administration in time range)  cholecalciferol (VITAMIN D) tablet 2,000 Units (has no administration in time range)  multivitamin with minerals tablet 1 tablet (has  no administration in time range)  fluticasone (FLONASE) 50 MCG/ACT nasal spray 2 spray (has no administration in time range)  montelukast (SINGULAIR) tablet 10 mg (10 mg Oral Given 09/26/20 2254)  acetaminophen (TYLENOL) tablet 650 mg (has no administration in time range)    Or  acetaminophen (TYLENOL) 160 MG/5ML solution 650 mg (has no administration in time range)    Or  acetaminophen (TYLENOL) suppository 650 mg (has no administration in time range)  senna-docusate (Senokot-S) tablet 1 tablet (has no administration in time range)  gabapentin (NEURONTIN) capsule 600 mg (600 mg Oral Given 09/26/20 2254)  insulin aspart (novoLOG) injection 0-15 Units (has no administration in time range)  insulin aspart (novoLOG) injection 0-5 Units (0 Units Subcutaneous Not Given 09/26/20 2255)  labetalol (NORMODYNE) injection 5 mg (has no administration in time range)  carvedilol (COREG) tablet 12.5 mg (has no administration in time range)  allopurinol (ZYLOPRIM) tablet 300 mg (has no administration in time range)  aspirin EC tablet 81 mg (has no  administration in time range)  sodium chloride flush (NS) 0.9 % injection 3 mL (3 mLs Intravenous Given 09/26/20 2029)  aspirin chewable tablet 324 mg (324 mg Oral Given 09/26/20 2030)   stroke: mapping our early stages of recovery book ( Does not apply Given 09/26/20 2256)    ____________________________________________   MDM / ED COURSE   75 year old male presents to the ED 5 days out from an acute ischemic stroke requiring medical admission for stroke work-up.  Slight hypertension, that we will admit.  Otherwise stable.  Exam with some subtle findings and expressive aphasia.  No signs of trauma or vascular deficits.  Blood work is unremarkable.  CT confirms acute ischemic stroke.  Certainly outside the window of any intervention.  We will admit to medicine for further work-up and management     ____________________________________________   FINAL CLINICAL IMPRESSION(S) / ED DIAGNOSES  Final diagnoses:  Cerebrovascular accident (CVA) due to other mechanism [facility])     ED Discharge Orders     None        Liam Cammarata   Note:  This document was prepared using Dragon voice recognition software and may include unintentional dictation errors.    Vladimir Crofts, MD 09/27/20 [phone]

## 2020-09-26 NOTE — ED Notes (Signed)
Called floor to find out which nurse to be assigned to room 108 since not currently listed it chart.

## 2020-09-26 NOTE — ED Triage Notes (Signed)
Pt comes with c/o possible stroke. Pt was just evaluated at PCP and was advised to come here due to his expressive aphasia.  Symptoms started week ago. Pt denies any pain. Pt is confused. Pt seems to be having trouble getting words out.  Pt states he feels off balance. Pt denies any blurry vision.

## 2020-09-26 NOTE — H&P (Signed)
History and Physical   Tim Miranda JME:[id number] DOB: Apr 14, 1945 DOA: 09/26/2020  PCP: Derinda Late, MD  Outpatient Specialists: Dr. Holley Raring, pain management Patient coming from: Home  I have personally briefly reviewed patient's old medical records in [place].  Chief Concern: Expressive aphasia  HPI: Tim Miranda is a 75 y.o. male with medical history significant for insulin-dependent diabetes mellitus, hypothyroid, hypertension, hyperlipidemia, gout, presents to the emergency department for chief concerns of expressive aphasia.  On Wednesday, 09/21/20 he sprayed the yard with chemicals to keep the deers out. He went to his primary care doctor and endorsed shortness of breath, weakness, dizziness, headaches.  His doctor at that time states that he may have had poisoning from this chemical spray.  He calls to his PCP again today on 09/26/2020 AM, stating that on 09/25/2020 in the afternoon he was having difficulty when talking with his son especially with finding words and expressing himself.  He denies difficulty with ambulation.    At bedside he is able to tell me his name, his age, current calendar year, and current location.  There is mild facial drooping and weakness of the right upper extremity compared to the left. He states he is never felt this way before.  He denies chest pain, shortness of breath, nausea, vomiting, cough, abdominal pain, dysuria, diarrhea, syncope, loss of consciousness.  Patient states he takes a daily 81 mg aspirin.  Social history: He denies history of tobacco use, EtOH, recreational drug use.  He is a retired Scientist, research (life sciences).  Vaccination history: He is vaccinated for COVID-19 with 2 Moderna doses  ROS: Constitutional: no weight change, no fever ENT/Mouth: no sore throat, no rhinorrhea Eyes: no eye pain, no vision changes Cardiovascular: no chest pain, no dyspnea,  no edema, no palpitations Respiratory: no cough, no sputum, no  wheezing Gastrointestinal: no nausea, no vomiting, no diarrhea, no constipation Genitourinary: no urinary incontinence, no dysuria, no hematuria Musculoskeletal: no arthralgias, no myalgias Skin: no skin lesions, no pruritus, Neuro: + weakness, no loss of consciousness, no syncope Psych: no anxiety, no depression, no decrease appetite Heme/Lymph: no bruising, no bleeding  ED Course: Discussed with emergency medicine provider, patient requiring hospitalization for subacute stroke.  Vitals in the emergency department was remarkable for temperature of 98, respiration rate of 18, heart rate 67, initial blood pressure 200/85 and improved to 191/84, SPO2 of 100% on room air.  CBC with differential and CMP was unremarkable.  BUN was 17, serum creatinine of 0.87, nonfasting blood glucose 97.  COVID PCR is pending  EDP gave patient one-time dose of aspirin 324 mg p.o  CT of the head without contrast was ordered by emergency medicine provider and was read as focal hypodensity within the left parietal lobe consistent with acute to subacute infarct.  Negative for hemorrhage.  Atrophy.  Assessment/Plan  Principal Problem:   Stroke [facility]) Active Problems:   Chronic pain syndrome   Essential hypertension   Hypothyroidism   Insulin dependent type 2 diabetes mellitus ([place])   Hyperlipidemia   Gout   # Stroke-like symptoms # Expressive aphasia - Status post aspirin 324 mg per EDP, I resumed home aspirin 81 mg daily - CT head without contrast read above - Would recommend a.m. team to consult neurology in the a.m. for further recommendations - Complete echo ordered - MRI of the brain not ordered as we have a diagnosis of stroke at this time,  - A.m. labs: Fasting lipid, A1c ordered, TSH - Check B12 -  Permissive hypertension with labetalol 5 mg IV every 2 hours.  For SBP greater than 220, 2 doses ordered - Frequent neuro vascular checks - N.p.o. pending swallow study - PT, OT - Fall precaution    # Hypertension-resumed losartan 100 mg daily, carvedilol 12.5 mg p.o. twice daily for 09/27/2020 # Hyperlipidemia-pravastatin 80 mg nightly # Hypothyroid-levothyroxine 50 mcg daily before breakfast resumed  # Insulin-dependent diabetes mellitus-Lantus 10 units subcutaneous daily resumed - Insulin SSI with at bedtime coverage ordered - Did not resume home glipizide  # Neuropathy-gabapentin 300 mg daily, 600 mg nightly resumed # Depression/anxiety-resumed home citalopram 10 mg daily # Gout  Chart reviewed.   DVT prophylaxis: SCDs Code Status: Full code Diet: Heart healthy/carb modified Family Communication: No Disposition Plan: Pending clinical course Consults called: None at this time Admission status: MedSurg, observation, telemetry  Past Medical History:  Diagnosis Date   Actinic keratosis    Arthritis    Coronary artery disease    Degenerative disc disease, lumbar    Depression    Diabetes mellitus without complication ([place])    Dysplastic nevus 10/27/2018   Right low back 4.0cm lat to spine. Mild atypia, limited margins free.   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 to spine above waistline. Moderate atypia, limited margins free.    GERD (gastroesophageal reflux disease)    Gout    Hx of CABG    Hypercholesteremia    Hypertension    PLMD (periodic limb movement disorder)    Sleep apnea    Thyroid disease    Past Surgical History:  Procedure Laterality Date   BACK SURGERY     CARPAL TUNNEL RELEASE     COLONOSCOPY WITH PROPOFOL N/A 09/19/2016   Procedure: COLONOSCOPY WITH PROPOFOL;  Surgeon: Manya Silvas, MD;  Location[facility] ENDOSCOPY;  Service: Endoscopy;  Laterality: N/A;   CORONARY ARTERY BYPASS GRAFT     ESOPHAGOGASTRODUODENOSCOPY (EGD) WITH PROPOFOL N/A 09/19/2016   Procedure: ESOPHAGOGASTRODUODENOSCOPY (EGD) WITH PROPOFOL;  Surgeon: Manya Silvas, MD;  Location: [facility] ENDOSCOPY;  Service: Endoscopy;  Laterality: N/A;   EYE SURGERY      SHOULDER SURGERY     Social History:  reports that he has quit smoking. His smoking use included cigarettes. He has a 0.75 pack-year smoking history. He has never used smokeless tobacco. He reports that he does not drink alcohol and does not use drugs.  Allergies  Allergen Reactions   Atorvastatin Other (See Comments)   Isosorbide    Lisinopril    Metformin And Related    Family History  Problem Relation Age of Onset   Heart disease Mother    Heart disease Father    Family history: Family history reviewed and not pertinent  Prior to Admission medications   Medication Sig Start Date End Date Taking? Authorizing Provider  acetaminophen (TYLENOL) 500 MG tablet Take 500 mg by mouth in the morning and at bedtime.    [provider]  allopurinol (ZYLOPRIM) 300 MG tablet Take 300 mg by mouth daily.    [provider]  aspirin EC 81 MG tablet Take 81 mg by mouth daily.    [provider]  carvedilol (COREG) 12.5 MG tablet Take 12.5 mg by mouth 2 (two) times daily.     [provider]  cholecalciferol (VITAMIN D) 25 MCG (1000 UT) tablet Take 2,000 Units by mouth in the morning and at bedtime.    [provider]  CINNAMON PO Take 1,000 mg by  mouth daily.    [provider]  citalopram (CELEXA) 20 MG tablet TAKE ONE-HALF TABLET BY MOUTH AT BEDTIME FOR ANXIETY 04/12/20   [provider]  Coenzyme Q10 100 MG capsule Take 100 mg by mouth daily.     [provider]  fluticasone (FLONASE) 50 MCG/ACT nasal spray INSTILL 1 SPRAY INTO EACH NOSTRIL TWO TIMES A DAY MAXIMUM 2 SPRAYS IN EACH NOSTRIL DAILY. FOR ALLERGIES 04/08/20   [provider]  furosemide (LASIX) 40 MG tablet Take 0.5 tablets (20 mg total) by mouth daily as needed (leg swelling or weight gain of 3lb or more). 11/22/18 11/22/19  Mayo, Pete Pelt, MD  gabapentin (NEURONTIN) 300 MG capsule 300 mg qAM, 600 mg QHS 09/15/20   Gillis Santa, MD  glipiZIDE (GLUCOTROL) 10  MG tablet Take 5 mg by mouth 2 (two) times daily before a meal.    [provider]  insulin glargine (LANTUS) 100 UNIT/ML injection 10 Units. 04/08/20   [provider]  levothyroxine (SYNTHROID, LEVOTHROID) 50 MCG tablet Take 50 mcg by mouth daily before breakfast.    [provider]  losartan (COZAAR) 50 MG tablet Take 100 mg by mouth daily.    [provider]  meloxicam (MOBIC) 15 MG tablet Take by mouth. 04/08/20   [provider]  Menthol-Methyl Salicylate (THERA-GESIC) 0.5-15 % CREA APPLY SMALL AMOUNT TOPICALLY FOUR TIMES A DAY AS NEEDED FOR PAIN ( AVOID FACE & EYES, [place] HANDS AFTER USING). FOR JOINT PAIN 08/25/19   [provider]  Misc Natural Products ([place]) Take 1 tablet by mouth daily.    [provider]  montelukast (SINGULAIR) 10 MG tablet Take 10 mg by mouth at bedtime. Patient not taking: No sig reported    [provider]  montelukast (SINGULAIR) 10 MG tablet Take 1 tablet by mouth at bedtime. 04/08/20   [provider]  Multiple Vitamins-Minerals (MULTIVITAMINS THER. W/MINERALS) TABS tablet Take 1 tablet by mouth daily.    [provider]  naproxen sodium (ALEVE) 220 MG tablet Take 220 mg by mouth. Patient not taking: Reported on 07/11/2020    [provider]  Omega-3 Fatty Acids (FISH OIL ODOR-LESS) 1200 MG CAPS Take 3,600 mg by mouth at bedtime. Patient not taking: No sig reported    [provider]  Omega-3 Fatty Acids (FISH OIL) 1000 MG CAPS Take by mouth 2 (two) times daily.    [provider]  ONGLYZA 5 MG TABS tablet Take 5 mg by mouth daily. 11/06/18   [provider]  pravastatin (PRAVACHOL) 80 MG tablet Take 80 mg by mouth at bedtime.     [provider]   Physical Exam: Vitals:   09/26/20 1631 09/26/20 1915 09/26/20 2035  BP: (!) 200/85 (!) 191/84 (!) 188/82  Pulse: 67 60 (!) 56  Resp: 18 18 20   Temp: 98 F  (36.7 C)    SpO2: 100% 97% 97%  Weight:  104.3 kg   Height:  5\' 8"  (1.727 m)    Constitutional: appears age-appropriate, NAD, calm, comfortable Eyes: PERRL, lids and conjunctivae normal ENMT: Mucous membranes are moist. Posterior pharynx clear of any exudate or lesions. Age-appropriate dentition.  Hearing loss.  When smiling the right lips are slightly lower than the left. Neck: normal, supple, no masses, no thyromegaly Respiratory: clear to auscultation bilaterally, no wheezing, no crackles. Normal respiratory effort. No accessory muscle use.  Cardiovascular: Regular rate and rhythm, no murmurs / rubs / gallops. No  extremity edema. 2+ pedal pulses. No carotid bruits.  Abdomen: Obese abdomen no tenderness, no masses palpated, no hepatosplenomegaly. Bowel sounds positive.  Musculoskeletal: no clubbing / cyanosis. No joint deformity upper and lower extremities. Good ROM, no contractures, no atrophy. Normal muscle tone.  Skin: no rashes, lesions, ulcers. No induration Neurologic: Sensation intact.  Strength in the right upper extremity is mildly decreased compared to the left upper extremity. Psychiatric: Normal judgment and insight. Alert and oriented x 3. Normal mood.   EKG: independently reviewed, showing sinus rhythm with rate of 67, QTc 456, right bundle branch block  CT the head on Admission: I personally reviewed and I agree with radiologist reading as below.  CT HEAD WO CONTRAST  Result Date: 09/26/2020 CLINICAL DATA:  Aphasia confusion EXAM: CT HEAD WITHOUT CONTRAST TECHNIQUE: Contiguous axial images were obtained from the base of the skull through the vertex without intravenous contrast. COMPARISON:  CT brain 07/08/2016 FINDINGS: Brain: Hypodensity within the left parietal lobe consistent with acute to subacute infarct. No hemorrhage, significant mass effect or midline shift. Atrophy. Nonenlarged ventricles Vascular: No hyperdense vessels.  Carotid vascular calcification Skull: Normal.  Negative for fracture or focal lesion. Sinuses/Orbits: No acute finding. Mucous retention cysts in the left maxillary sinus Other: None IMPRESSION: 1. Focal hypodensity within the left parietal lobe consistent with acute to subacute infarct. Negative for hemorrhage. 2. Atrophy Electronically Signed   By: Donavan Foil M.D.   On: 09/26/2020 17:23    Labs on Admission: I have personally reviewed following labs  CBC: Recent Labs  Lab 09/26/20 1634  WBC 10.4  NEUTROABS 5.3  HGB 15.3  HCT 43.6  MCV 91.4  PLT 300   Basic Metabolic Panel: Recent Labs  Lab 09/26/20 1634  NA 138  K 4.0  CL 103  CO2 24  GLUCOSE 97  BUN 17  CREATININE 0.87  CALCIUM 9.8   GFR: Estimated Creatinine Clearance: 84.6 mL/min (by C-G formula based on SCr of 0.87 mg/dL).  Liver Function Tests: Recent Labs  Lab 09/26/20 1634  AST 31  ALT 32  ALKPHOS 43  BILITOT 0.6  PROT 7.4  ALBUMIN 4.5   Coagulation Profile: Recent Labs  Lab 09/26/20 1634  INR 1.1   Dr. Tobie Poet [facility]  If 7PM-7AM, please contact overnight-coverage provider If 7AM-7PM, please contact day coverage provider [email or web address]  09/26/2020, 8:44 PM

## 2020-09-27 ENCOUNTER — Observation Stay (HOSPITAL_COMMUNITY)
Admit: 2020-09-27 | Discharge: 2020-09-27 | Disposition: A | Discharge status: Home / Self Care | Payer: Medicare Other | Attending: Internal Medicine | Admitting: Internal Medicine

## 2020-09-27 ENCOUNTER — Inpatient Hospital Stay: Payer: Medicare Other

## 2020-09-27 ENCOUNTER — Encounter: Payer: Self-pay | Admitting: Internal Medicine

## 2020-09-27 ENCOUNTER — Observation Stay: Payer: Medicare Other

## 2020-09-27 DIAGNOSIS — Z7982 Long term (current) use of aspirin: Secondary | ICD-10-CM | POA: Diagnosis not present

## 2020-09-27 DIAGNOSIS — Z20822 Contact with and (suspected) exposure to covid-19: Secondary | ICD-10-CM | POA: Diagnosis present

## 2020-09-27 DIAGNOSIS — I6521 Occlusion and stenosis of right carotid artery: Secondary | ICD-10-CM | POA: Diagnosis present

## 2020-09-27 DIAGNOSIS — I639 Cerebral infarction, unspecified: Secondary | ICD-10-CM | POA: Diagnosis present

## 2020-09-27 DIAGNOSIS — E039 Hypothyroidism, unspecified: Secondary | ICD-10-CM | POA: Diagnosis present

## 2020-09-27 DIAGNOSIS — F32A Depression, unspecified: Secondary | ICD-10-CM | POA: Diagnosis present

## 2020-09-27 DIAGNOSIS — G4730 Sleep apnea, unspecified: Secondary | ICD-10-CM | POA: Diagnosis present

## 2020-09-27 DIAGNOSIS — R0602 Shortness of breath: Secondary | ICD-10-CM | POA: Diagnosis present

## 2020-09-27 DIAGNOSIS — Z79899 Other long term (current) drug therapy: Secondary | ICD-10-CM | POA: Diagnosis not present

## 2020-09-27 DIAGNOSIS — I6389 Other cerebral infarction: Secondary | ICD-10-CM

## 2020-09-27 DIAGNOSIS — Z79890 Hormone replacement therapy (postmenopausal): Secondary | ICD-10-CM | POA: Diagnosis not present

## 2020-09-27 DIAGNOSIS — Z951 Presence of aortocoronary bypass graft: Secondary | ICD-10-CM | POA: Diagnosis not present

## 2020-09-27 DIAGNOSIS — I63412 Cerebral infarction due to embolism of left middle cerebral artery: Secondary | ICD-10-CM | POA: Diagnosis present

## 2020-09-27 DIAGNOSIS — R4701 Aphasia: Secondary | ICD-10-CM | POA: Diagnosis present

## 2020-09-27 DIAGNOSIS — M109 Gout, unspecified: Secondary | ICD-10-CM | POA: Diagnosis present

## 2020-09-27 DIAGNOSIS — E1140 Type 2 diabetes mellitus with diabetic neuropathy, unspecified: Secondary | ICD-10-CM | POA: Diagnosis present

## 2020-09-27 DIAGNOSIS — R29701 NIHSS score 1: Secondary | ICD-10-CM | POA: Diagnosis present

## 2020-09-27 DIAGNOSIS — E7800 Pure hypercholesterolemia, unspecified: Secondary | ICD-10-CM | POA: Diagnosis present

## 2020-09-27 DIAGNOSIS — E1165 Type 2 diabetes mellitus with hyperglycemia: Secondary | ICD-10-CM | POA: Diagnosis present

## 2020-09-27 DIAGNOSIS — I10 Essential (primary) hypertension: Secondary | ICD-10-CM | POA: Diagnosis present

## 2020-09-27 DIAGNOSIS — Z87891 Personal history of nicotine dependence: Secondary | ICD-10-CM | POA: Diagnosis not present

## 2020-09-27 DIAGNOSIS — Z794 Long term (current) use of insulin: Secondary | ICD-10-CM | POA: Diagnosis not present

## 2020-09-27 DIAGNOSIS — R29810 Facial weakness: Secondary | ICD-10-CM | POA: Diagnosis present

## 2020-09-27 DIAGNOSIS — G894 Chronic pain syndrome: Secondary | ICD-10-CM | POA: Diagnosis present

## 2020-09-27 DIAGNOSIS — G8321 Monoplegia of upper limb affecting right dominant side: Secondary | ICD-10-CM | POA: Diagnosis present

## 2020-09-27 DIAGNOSIS — I2510 Atherosclerotic heart disease of native coronary artery without angina pectoris: Secondary | ICD-10-CM | POA: Diagnosis present

## 2020-09-27 DIAGNOSIS — Z888 Allergy status to other drugs, medicaments and biological substances status: Secondary | ICD-10-CM | POA: Diagnosis not present

## 2020-09-27 LAB — ECHOCARDIOGRAM COMPLETE
AR max vel: 2.37 cm2
AV Area VTI: 2.71 cm2
AV Area mean vel: 2.33 cm2
AV Mean grad: 4 mmHg
AV Peak grad: 6.4 mmHg
Ao pk vel: 1.26 m/s
Area-P 1/2: 3.15 cm2
Height: 68 in
S' Lateral: 2.2 cm
Weight: 3680 oz

## 2020-09-27 LAB — BASIC METABOLIC PANEL
Anion gap: 10 (ref 5–15)
BUN: 17 mg/dL (ref 8–23)
CO2: 26 mmol/L (ref 22–32)
Calcium: 9.4 mg/dL (ref 8.9–10.3)
Chloride: 103 mmol/L (ref 98–111)
Creatinine, Ser: 0.95 mg/dL (ref 0.61–1.24)
GFR, Estimated: >60 mL/min (ref >60)
Glucose, Bld: 127 mg/dL — ABNORMAL HIGH (ref 70–99)
Potassium: 4 mmol/L (ref 3.5–5.1)
Sodium: 139 mmol/L (ref 135–145)

## 2020-09-27 LAB — CBC
HCT: 42.2 % (ref 39.0–52.0)
Hemoglobin: 14.9 g/dL (ref 13.0–17.0)
MCH: 32.7 pg (ref 26.0–34.0)
MCHC: 35.3 g/dL (ref 30.0–36.0)
MCV: 92.7 fL (ref 80.0–100.0)
Platelets: 247 10*3/uL (ref 150–400)
RBC: 4.55 MIL/uL (ref 4.22–5.81)
RDW: 13.3 % (ref 11.5–15.5)
WBC: 12.8 10*3/uL — ABNORMAL HIGH (ref 4.0–10.5)
nRBC: 0 % (ref 0.0–0.2)

## 2020-09-27 LAB — VITAMIN B12: Vitamin B-12: 264 pg/mL (ref 180–914)

## 2020-09-27 LAB — GLUCOSE, CAPILLARY
Glucose-Capillary: 163 mg/dL — ABNORMAL HIGH (ref 70–99)
Glucose-Capillary: 202 mg/dL — ABNORMAL HIGH (ref 70–99)
Glucose-Capillary: 195 mg/dL — ABNORMAL HIGH (ref 70–99)
Glucose-Capillary: 126 mg/dL — ABNORMAL HIGH (ref 70–99)

## 2020-09-27 LAB — LIPID PANEL
Cholesterol: 147 mg/dL (ref 0–200)
HDL: 29 mg/dL — ABNORMAL LOW (ref >40)
LDL Cholesterol: 42 mg/dL (ref 0–99)
Total CHOL/HDL Ratio: 5.1 RATIO
Triglycerides: 380 mg/dL — ABNORMAL HIGH (ref <150)
VLDL: 76 mg/dL — ABNORMAL HIGH (ref 0–40)

## 2020-09-27 LAB — HEMOGLOBIN A1C
Hgb A1c MFr Bld: 6.9 % — ABNORMAL HIGH (ref 4.8–5.6)
Mean Plasma Glucose: 151.33 mg/dL

## 2020-09-27 LAB — TSH: TSH: 2.887 u[IU]/mL (ref 0.350–4.500)

## 2020-09-27 MED ORDER — CLOPIDOGREL BISULFATE 75 MG PO TABS
300.0000 mg | ORAL_TABLET | Freq: Once | ORAL | Status: AC
  Administered 2020-09-27: 17:00:00 300 mg via ORAL
  Filled 2020-09-27: qty 4

## 2020-09-27 MED ORDER — CLOPIDOGREL BISULFATE 75 MG PO TABS
75.0000 mg | ORAL_TABLET | Freq: Every day | ORAL | Status: DC
  Administered 2020-09-28: 08:00:00 75 mg via ORAL
  Filled 2020-09-27: qty 1

## 2020-09-27 MED ORDER — VITAMIN B-12 1000 MCG PO TABS
1000.0000 ug | ORAL_TABLET | Freq: Every day | ORAL | Status: DC
  Administered 2020-09-27 – 2020-09-28 (×2): 1000 ug via ORAL
  Filled 2020-09-27: qty 1

## 2020-09-27 MED ORDER — IOHEXOL 350 MG/ML SOLN
75.0000 mL | Freq: Once | INTRAVENOUS | Status: AC | PRN
  Administered 2020-09-27: 75 mL via INTRAVENOUS

## 2020-09-27 NOTE — TOC Initial Note (Signed)
Transition of Care [facility]) - Initial/Assessment Note    Patient Details  Name: Tim Miranda MRN: [id number] Date of Birth: 1945/10/24  [facility]) CM/SW Contact:    Kerin Salen, RN Phone Number: 09/27/2020, 3:04 PM  Clinical Narrative:  Spoke with patient briefly, wife at bedside. Patient alert and oriented x4, voices independent at home with ambulation and ADL's. Patient and wife agrees with outpatient speech therapy, referral on chart. Attending notified to sign, unit clerk to fax for scheduling. No TOC needs assessed at this time, will continue to track until discharged.                 Expected Discharge Plan: Home/Self Care Barriers to Discharge: Continued Medical Work up   Patient Goals and CMS Choice Patient states their goals for this hospitalization and ongoing recovery are:: To return home   Choice offered to / list presented to : NA  Expected Discharge Plan and Services Expected Discharge Plan: Home/Self Care In-house Referral: Clinical Social Work Discharge Planning Services: Other - See comment (outpatient ST) Post Acute Care Choice: NA Living arrangements for the past 2 months: Single Family Home                 DME Arranged: N/A DME Agency: NA       HH Arranged: Speech Therapy (Referral on chart Attending to sign)          Prior Living Arrangements/Services Living arrangements for the past 2 months: Single Family Home Lives with:: Spouse, Significant Other Patient language and need for interpreter reviewed:: Yes Do you feel safe going back to the place where you live?: Yes      Need for Family Participation in Patient Care: Yes (Comment) Care giver support system in place?: Yes (comment)   Criminal Activity/Legal Involvement Pertinent to Current Situation/Hospitalization: No - Comment as needed  Activities of Daily Living Home Assistive Devices/Equipment: None ADL Screening (condition at time of admission) Patient's cognitive ability  adequate to safely complete daily activities?: Yes Is the patient deaf or have difficulty hearing?: Yes Does the patient have difficulty seeing, even when wearing glasses/contacts?: No Does the patient have difficulty concentrating, remembering, or making decisions?: No Patient able to express need for assistance with ADLs?: Yes Does the patient have difficulty dressing or bathing?: No Independently performs ADLs?: Yes (appropriate for developmental age) Does the patient have difficulty walking or climbing stairs?: No Weakness of Legs: None Weakness of Arms/Hands: None  Permission Sought/Granted                  Emotional Assessment Appearance:: Appears stated age Attitude/Demeanor/Rapport: Engaged Affect (typically observed): Stable Orientation: : Oriented to Self, Oriented to Place, Oriented to  Time, Oriented to Situation Alcohol / Substance Use: Not Applicable Psych Involvement: No (comment)  Admission diagnosis:  Shortness of breath [R06.02] Stroke [facility]) [I63.9] Cerebrovascular accident (CVA) due to other mechanism [facility]) [I63.89] Patient Active Problem List   Diagnosis Date Noted   Stroke ([place]) 09/26/2020   Essential hypertension 09/26/2020   Hypothyroidism 09/26/2020   Insulin dependent type 2 diabetes mellitus ([place]) 09/26/2020   Hyperlipidemia 09/26/2020   Gout 09/26/2020   Lumbar facet arthropathy 04/30/2019   History of lumbar fusion (L3-L4 TLIF Nov 2017) 12/18/2018   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

## 2020-09-27 NOTE — Discharge Instructions (Addendum)
Stroke Nutrition Therapy This eating plan is low in sodium (which comes mostly from salt). You should have plenty of vegetables, fruits, whole grains, and fat-free or low-fat dairy products. These foods contain nutrients that can help keep blood pressure under control. You should eat heart-healthy kinds of fat to reduce the buildup of plaque in your blood vessels.  If you need to lose weight, following the plan can help you because it limits high-fat foods and refined carbohydrates. Everyone who has had a stroke should talk to their doctor about what a healthy weight is for them.  Tips to Control Blood Pressure Limit the sodium that you get from food and drink. Your doctor or registered dietitian can tell you the limit that is right for you. In general, foods with more than 300 milligrams (mg) sodium per serving may not fit into your meal plan. Do not salt food at the table. Use very little salt, if any, when you cook. Choose carefully when you eat away from home. Restaurant foods can be very high in sodium. Let the person taking your order know that you want low-salt or no-salt choices. Many restaurants have special menus or will prepare food with less salt. Eat plenty of fruits and vegetables that are high in potassium. Good fruit choices include bananas, apricots, oranges, cantaloupe, and apples. High-potassium vegetables include potatoes, sweet potatoes, spinach, zucchini, and tomatoes.  Have fat-free and low-fat dairy products. These will help you get the calcium and potassium that your body needs.  If you drink alcohol, limit the amount. Women should drink no more than one drink per day. Men should not drink more than two drinks per day. One drink is 12 ounces (oz) of beer, 5 oz of wine, or 1 oz of liquor.  Tips to Control Blood Cholesterol Levels Eat very little saturated fat and trans fat. These types of fat can raise the low-density lipoprotein, or LDL ("bad"), cholesterol in your  blood. Saturated fat is found in foods from animals, such as fatty meats, whole milk, butter, cream, and other dairy foods made with whole milk. It is also in tropical oils (palm, palm kernel, and coconut). Trans fat is found in all foods made with hydrogenated oils. It may be in fried foods, crackers, chips, and foods made with shortening or stick margarine. Choose unsaturated fats (heart-healthy fats), such as soybean, canola, olive, or sunflower oil. Liquid or soft tub margarines are also fine. Keep total amount of fat that you eat to less than 25% to 35% of the calories that you get from food and drink. Limit the cholesterol that you get from food to 200 mg of cholesterol per day. Foods high in cholesterol include egg yolks, fatty meats, shrimp, and dairy foods. Get 20 to 30 grams (g) of fiber per day: High-fiber foods include fruits, vegetables, and whole grains. Aim for 2 cups of fruit, 3 cups of vegetables, and 3 oz of whole grains per day. Soluble fiber is especially good for you. You can get it from oatmeal, dried beans, and peas.  As you add fiber to your eating plan, you should also drink more water or other fluids. This will help your body process the fiber without discomfort. Eat cold-water, fatty fish (such as salmon, tuna, mackerel, and sardines) twice a week. These fish provide omega-3 fats, which are heart-healthy. Be aware, however, that canned fish can be high in sodium. Choose fresh or frozen fish, or buy low-sodium canned types. Add ground flaxseed or flaxseed oil  to food, or eat walnuts. These plant foods are also high in omega-3 fats.  Foods Recommended Remember: Most foods should have less than 300 mg sodium per serving and have little or no saturated fat or trans fat. Food Group Foods Recommended  Grains Breads and cereals, especially those made with whole grains such as oats, barley, rye, or whole wheat Pasta, especially whole grain pastas Brown rice Low-fat, low-sodium  crackers and pretzels  Vegetables Fresh, frozen, or canned vegetables without added fat or salt Highly colored vegetables, such as broccoli, greens, sweet potatoes, and tomatoes are especially good for you.  Fruits Fresh, frozen, canned, or dried fruit  Milk and Milk Products Fat-free (skim), low-fat (1%) milk Buttermilk Nonfat or low-fat yogurt Nonfat, low-sodium cottage cheese Fat-free and low-fat, low-sodium cheese  Meat and Other Protein Foods Fish (especially fatty fish, such as salmon, fresh tuna, or mackerel) Lean cuts of beef and pork (loin, leg, round, extra lean hamburger) Low-sodium cold cuts made with lean meat or soy protein Skinless Engineer, building services and other wild game Unsalted nuts and nut butters Dried beans and peas Meat alternatives made with soy or textured vegetable protein Egg whites or egg substitute  Fats and Oils Unsaturated oils (soybean, olive, canola, sunflower, safflower) Soft or liquid margarines and vegetable oil spreads Salad dressings (nonfat or made with unsaturated oil) Seeds Avocado  Other Herbs and spices to add flavor to replace salt Unsalted, low-fat snack foods, such as unsalted pretzels or plain popcorn   Foods Not Recommended Remember: Most foods should have less than 300 mg sodium per serving and have little or no saturated fat or trans fat. Food Group Foods Not Recommended  Grains Baked goods made with hydrogenated oil or saturated fat Grain foods that are high in sodium or added sugar  Vegetables Canned vegetables (unless they are low sodium or salt free) Pickles, other vegetables packed in brine, such as sauerkraut Fried or breaded vegetables Vegetables in cream or butter sauces  Fruits Fried fruits; fruit dishes with cream or butter  Milk and Milk Products Cheese (except for low-fat, low-sodium types) Processed cheese products Whole milk Dairy foods made from whole milk or cream (such as ice cream and half-and-half)  Meat and Other  Protein Foods Canned or smoked meat or fish Marbled or fatty meats (such as bacon, sausage, hot dogs, regular hamburger) Whole eggs and egg yolks Poultry with skin High-sodium lunch or deli meats (such as salami) Canned beans (except for low-sodium or salt-free)  Fats and Oils Solid cooking fats (shortening, butter, stick margarine) Tropical oils (palm, palm kernel, or coconut oil) Hydrogenated oil (found in many packaged and fried foods)  Other Salt, seasoning mixes made with salt Soy sauce, miso Canned or dried soups (except for low-fat, low-sodium types) Bouillon cubes Ketchup, barbecue sauce, worscestershire sauce, salsa Sugary drinks (such as soft drinks or fruit drinks) Snack foods made with hydrogenated oil, shortening, or butter High-sodium snack foods (chips, pretzels, salted nuts) High-fat, high-sugar desserts High-fat gravies and sauces Premade foods (boxed pasta mixes, frozen dinners, and so on) if high in sodium or fat  Alcohol Women: Do not have more than 1 drink per day. Men: Do not have more than 2 drinks per day. 1 drink = 5 oz wine, 12 oz beer, or 1 oz liquor   Stroke Sample 1-Day Menu Breakfast 1/2 cup orange juice 1 cup nonfat milk 3/4 cup oatmeal 1/2 cup blueberries  Lunch 2 slices whole-wheat bread 3 oz [place] breast, low sodium 2  slices tomato 1 lettuce leaf 2 teaspoon low-fat mayonnaise 1 teaspoon mustard 1 cup summer squash 1/2 cup unsweetened applesauce  Afternoon Snack 1/2 cup canned apricots (in juice, not syrup) 1/2 cup low-fat, low-sodium cottage cheese  Evening Meal Tuna noodle casserole: 3 oz tuna, 1 cup noodles, 1/8 cup nonfat milk, 1teaspoon margarine    1/2 cup steamed spinach 1/2 cup cooked carrots 1 whole-wheat dinner roll 1 teaspoon margarine 1 cup nonfat milk

## 2020-09-27 NOTE — Progress Notes (Signed)
[facility] Progress Note  Patient: Tim Miranda    CNO:[id number]  DOA: 09/26/2020     Date of Service: the patient was seen and examined on 09/27/2020  Brief hospital course: Past medical history of HTN, HLD, type II DM.  Presents with complaints of difficulty speaking ongoing since Sunday.  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 Currently plan is further stroke work-up.  Subjective: Speech improving.  No other focal deficit no tingling or numbness.  No nausea no vomiting but no dizziness.  No headache.  No chest pain.  Assessment and Plan: 1.  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 MRI confirms left parietal subacute CVA. Neurology consulted. Echocardiogram performed.  Preserved EF.  Grade 1 diastolic dysfunction.  No valvular abnormality. On aspirin 81 mg as well as started on Plavix load followed by 75 mg Plavix. PT OT recommends no therapy. Speech therapy recommends outpatient SLP. Continue to monitor on telemetry. May require loop recorder.  2.  HTN Permissive hypertension. Monitor.  Holding medication.  3.  HLD On pravastatin.  Will monitor neurology recommendation  4.  Hypothyroidism Continue Synthroid.  5.  Type 2 diabetes mellitus, uncontrolled with hyperglycemia with Escue-acting insulin use with neuropathy complication Currently on sliding scale insulin. Monitor. Continue gabapentin.  6.  Depression and anxiety Continue Celexa.  Scheduled Meds:  allopurinol  300 mg Oral Daily   aspirin EC  81 mg Oral Daily   cholecalciferol  2,000 Units Oral Daily   citalopram  10 mg Oral Daily   [START ON 09/28/2020] clopidogrel  75 mg Oral Daily   fluticasone  2 spray Each Nare Daily   gabapentin  300 mg Oral Daily   gabapentin  600 mg Oral QHS   insulin aspart  0-15 Units Subcutaneous TID WC   insulin aspart  0-5 Units Subcutaneous QHS   insulin glargine  10 Units Subcutaneous Daily   levothyroxine  50 mcg Oral QAC breakfast    montelukast  10 mg Oral QHS   multivitamin with minerals  1 tablet Oral Daily   pravastatin  80 mg Oral QHS   vitamin B-12  1,000 mcg Oral Daily   Continuous Infusions: PRN Meds: acetaminophen **OR** acetaminophen (TYLENOL) oral liquid 160 mg/5 mL **OR** acetaminophen, senna-docusate  Body mass index is 34.97 kg/m.        DVT Prophylaxis:   SCD's Start: 09/26/20 2029    Advance goals of care discussion: Pt is Full code.  Family Communication: family was present at bedside, at the time of interview.  The pt provided permission to discuss medical plan with the family. Opportunity was given to ask question and all questions were answered satisfactorily.   Data Reviewed: I have personally reviewed and interpreted daily labs, tele strips, imaging. Sodium 139, creatinine normal.  LDL 42.  Total cholesterol 147.  Triglyceride 380.  WBC elevated.  Hemoglobin A1c 6.9.  Physical Exam:  General: Appear in mild distress, no Rash; Oral Mucosa Clear, moist. no Abnormal Neck Mass Or lumps, Conjunctiva normal  Cardiovascular: S1 and S2 Present, no Murmur, Respiratory: good respiratory effort, Bilateral Air entry present and CTA, no Crackles, no wheezes Abdomen: Bowel Sound present, Soft and no tenderness Extremities: no Pedal edema Neurology: alert and oriented to time, place, and person affect appropriate. no new focal deficit, mild aphasia. Gait not checked due to patient safety concerns   Vitals:   09/27/20 0721 09/27/20 1241 09/27/20 1523 09/27/20 1700  BP: 128/76 116/70 Marland Kitchen)  161/70 132/72  Pulse: 63 62 63   Resp: 14 20 18    Temp: 97.6 F (36.4 C) 97.8 F (36.6 C) 98 F (36.7 C)   TempSrc: Oral     SpO2: 96% 94% 93%   Weight:      Height:        Disposition:  Status is: Inpatient  Remains inpatient appropriate because:Ongoing diagnostic testing needed not appropriate for outpatient work up  Dispo: The patient is from: Home              Anticipated d/c is to: Home               Patient currently is not medically stable to d/c.   Difficult to place patient No        Time spent: 35 minutes. I reviewed all nursing notes, pharmacy notes, vitals, pertinent old records. I have discussed plan of care as described above with RN.  Author: Berle Mull, MD [facility] 09/27/2020 7:20 PM  To reach On-call, see care teams to locate the attending and reach out via [email or web address]. Between 7PM-7AM, please contact night-coverage If you still have difficulty reaching the attending provider, please page the [facility] (Director on Call) for [facility] on amion for assistance.

## 2020-09-27 NOTE — Evaluation (Signed)
Physical Therapy Evaluation Patient Details Name: VIDIT BOISSONNEAULT MRN: [id number] DOB: [birth year] Today's Date: 09/27/2020   History of Present Illness  Pt admited for CVA for possible CVA with complaints of expressive aphasia. History includes DM, HTN, HLD, gout, depression, and GERD  Clinical Impression  Pt is a pleasant 75 year old male who was admitted for CVA. Pt demonstrates all bed mobility/transfers/ambulation at baseline level. Pt does not require any further PT needs at this time. No sensation/coordination deficits noted at this time. Speech improving, however not quite at baseline yet. Pt will be dc in house and does not require follow up. RN aware. Will dc current orders.     Follow Up Recommendations No PT follow up    Equipment Recommendations  None recommended by PT    Recommendations for Other Services       Precautions / Restrictions Precautions Precautions: Fall Restrictions Weight Bearing Restrictions: No      Mobility  Bed Mobility Overal bed mobility: Independent             General bed mobility comments: ease of mobility    Transfers Overall transfer level: Independent Equipment used: None             General transfer comment: safe technique with ease of mobility  Ambulation/Gait Ambulation/Gait assistance: Independent Gait Distance (Feet): 200 Feet Assistive device: None Gait Pattern/deviations: WFL(Within Functional Limits)     General Gait Details: ambulates around Artist    Modified Rankin (Stroke Patients Only)       Balance Overall balance assessment: Independent                                           Pertinent Vitals/Pain Pain Assessment: No/denies pain    Home Living Family/patient expects to be discharged to:: Private residence Living Arrangements: Spouse/significant other Available Help at Discharge: Family Type of Home: House        Home Layout: Able to live on main level with bedroom/bathroom;Laundry or work area in [employer]: None      Prior Function Level of Independence: Independent         Comments: reports no falls and is completely indep at baseline     Hand Dominance   Dominant Hand: Right    Extremity/Trunk Assessment   Upper Extremity Assessment Upper Extremity Assessment: Overall WFL for tasks assessed    Lower Extremity Assessment Lower Extremity Assessment: Overall WFL for tasks assessed       Communication   Communication: No difficulties  Cognition Arousal/Alertness: Awake/alert Behavior During Therapy: WFL for tasks assessed/performed Overall Cognitive Status: Within Functional Limits for tasks assessed                                        General Comments      Exercises     Assessment/Plan    PT Assessment Patent does not need any further PT services  PT Problem List         PT Treatment Interventions      PT Goals (Current goals can be found in the Care Plan section)  Acute Rehab PT Goals Patient Stated Goal: to go home PT Goal Formulation: All  assessment and education complete, DC therapy Time For Goal Achievement: 09/27/20 Potential to Achieve Goals: Good    Frequency     Barriers to discharge        Co-evaluation               AM-PAC PT "6 Clicks" Mobility  Outcome Measure Help needed turning from your back to your side while in a flat bed without using bedrails?: None Help needed moving from lying on your back to sitting on the side of a flat bed without using bedrails?: None Help needed moving to and from a bed to a chair (including a wheelchair)?: None Help needed standing up from a chair using your arms (e.g., wheelchair or bedside chair)?: None Help needed to walk in hospital room?: None Help needed climbing 3-5 steps with a railing? : None 6 Click Score: 24    End of Session Equipment Utilized During  Treatment: Gait belt Activity Tolerance: Patient tolerated treatment well Patient left: in bed (seated on EOB) Nurse Communication: Mobility status PT Visit Diagnosis: Muscle weakness (generalized) (M62.81)    Time: 7955-8316 PT Time Calculation (min) (ACUTE ONLY): 11 min   Charges:   PT Evaluation $PT Eval Low Complexity: 1 Low          Greggory Stallion, PT, DPT [phone]   Jerin Franzel 09/27/2020, 10:30 AM

## 2020-09-27 NOTE — Progress Notes (Signed)
*  PRELIMINARY RESULTS* Echocardiogram 2D Echocardiogram has been performed.  Sherrie Sport 09/27/2020, 9:58 AM

## 2020-09-27 NOTE — Evaluation (Signed)
Speech Language Pathology Evaluation Patient Details Name: Tim Miranda MRN: [id number] DOB: [birth year] Today's Date: 09/27/2020 Time: 1027-2536 SLP Time Calculation (min) (ACUTE ONLY): 20 min  Problem List:  Patient Active Problem List   Diagnosis Date Noted   Stroke ([place]) 09/26/2020   Essential hypertension 09/26/2020   Hypothyroidism 09/26/2020   Insulin dependent type 2 diabetes mellitus ([place]) 09/26/2020   Hyperlipidemia 09/26/2020   Gout 09/26/2020   Lumbar facet arthropathy 04/30/2019   History of lumbar fusion (L3-L4 TLIF Nov 2017) 12/18/2018   Chronic radicular lumbar pain (R L3/4) 12/18/2018   Lumbar radiculopathy 12/18/2018   Spinal stenosis, lumbar region, 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 Mild atypia, limited margins free.   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 to spine above waistline. Moderate atypia, limited margins free.    GERD (gastroesophageal reflux disease)    Gout    Hx of CABG    Hypercholesteremia    Hypertension    PLMD (periodic limb movement disorder)    Sleep apnea    Thyroid disease    Past Surgical History:  Past Surgical History:  Procedure Laterality Date   BACK SURGERY     CARPAL TUNNEL RELEASE     COLONOSCOPY WITH PROPOFOL N/A 09/19/2016   Procedure: COLONOSCOPY WITH PROPOFOL;  Surgeon: Manya Silvas, MD;  Location: [facility] ENDOSCOPY;  Service: Endoscopy;  Laterality: N/A;   CORONARY ARTERY BYPASS GRAFT     ESOPHAGOGASTRODUODENOSCOPY (EGD) WITH PROPOFOL N/A 09/19/2016    Procedure: ESOPHAGOGASTRODUODENOSCOPY (EGD) WITH PROPOFOL;  Surgeon: Manya Silvas, MD;  Location: [facility] ENDOSCOPY;  Service: Endoscopy;  Laterality: N/A;   EYE SURGERY     SHOULDER SURGERY     HPI:  Tim Miranda is a 75 y.o. male with medical history significant for insulin-dependent diabetes mellitus, hypothyroid, hypertension, hyperlipidemia, gout, presents to the emergency department for chief concerns of expressive aphasia. Head CT revealed focal hypodensity within the left parietal lobe consistent with acute to subacute infarct. Negative for hemorrhage.   Assessment / Plan / Recommendation Clinical Impression  Pt presents with mild to moderate dysfluent speech that is characterized by false starts, phonemic repetition that is consistent throughout his speech. This is further complicated by pt's mild word finding deficits. While pt is able to communicate his basic wants and needs, his speech is laborsome and will require skilled ST intervention in order to progress to a functionally independent state with verbal communication. At this time, recommend Outpatient ST services at discharge.    SLP Assessment  SLP Recommendation/Assessment: All further Speech Lanaguage Pathology  needs can be addressed in the next venue of care SLP Visit Diagnosis: Aphasia (R47.01);Apraxia (R48.2)    Follow Up Recommendations  Outpatient SLP    Frequency and Duration           SLP Evaluation Cognition  Overall Cognitive Status: Within Functional Limits for tasks assessed Arousal/Alertness: Awake/alert Orientation Level: Oriented X4       Comprehension  Auditory Comprehension Overall Auditory  Comprehension: Appears within functional limits for tasks assessed Visual Recognition/Discrimination Discrimination: Not tested Reading Comprehension Reading Status: Not tested    Expression Expression Primary Mode of Expression: Verbal Verbal Expression Overall Verbal Expression:  Impaired Initiation: No impairment Automatic Speech: Name;Social Response;Day of week;Month of year Level of Generative/Spontaneous Verbalization: Conversation Repetition: No impairment Naming: Impairment Responsive: 76-100% accurate Confrontation: Within functional limits Convergent: 75-100% accurate Divergent: 75-100% accurate Verbal Errors: Aware of errors Pragmatics: No impairment Non-Verbal Means of Communication: Not applicable Written Expression Dominant Hand: Right Written Expression: Not tested   Oral / Motor  Oral Motor/Sensory Function Overall Oral Motor/Sensory Function: Mild impairment Facial ROM: Reduced right Facial Symmetry: Abnormal symmetry right Facial Strength: Reduced right Facial Sensation: Within Functional Limits Lingual ROM: Within Functional Limits Lingual Symmetry: Within Functional Limits Lingual Strength: Within Functional Limits Lingual Sensation: Within Functional Limits Velum: Within Functional Limits Mandible: Within Functional Limits Motor Speech Overall Motor Speech: Impaired Respiration: Within functional limits Phonation: Normal Resonance: Within functional limits Articulation: Within functional limitis Intelligibility: Intelligible Motor Planning: Impaired Level of Impairment: Phrase Motor Speech Errors: Aware;Consistent Effective Techniques: Slow rate   GO                   Tim Miranda B. Rutherford Nail M.S., CCC-SLP, [place] Office [phone]  Stormy Fabian 09/27/2020, 8:53 AM

## 2020-09-27 NOTE — Consult Note (Addendum)
Neurology Consultation Reason for Consult: Stroke Referring Physician: Marlowe Sax  CC: Stroke  History is obtained from: Patient  HPI: Tim Miranda is a 75 y.o. male with a history of hypertension, hyperlipidemia, diabetes who presents with difficulty speaking that started Sunday.  He states that when he woke up on Sunday morning he was not having any difficulty but then had some difficulty understanding his preacher in church.  When speaking with his son later on in the evening, his son thought the phone was going in and out because of his broken speech.  Since that time, his speech has been steadily improving but he is still having some mild difficulty, especially with understanding.  He denies any weakness, numbness, visual change, nausea vomiting, or other symptoms.  LKW: Sunday morning tpa given?: no, outside of window   ROS: A 14 point ROS was performed and is negative except as noted in the HPI.  Past Medical History:  Diagnosis Date   Actinic keratosis    Arthritis    Coronary artery disease    Degenerative disc disease, lumbar    Depression    Diabetes mellitus without complication ([place])    Dysplastic nevus 10/27/2018   Right low back 4.0cm lat to spine. Mild atypia, limited margins free.   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 to spine above waistline. Moderate atypia, limited margins free.    GERD (gastroesophageal reflux disease)    Gout    Hx of CABG    Hypercholesteremia    Hypertension    PLMD (periodic limb movement disorder)    Sleep apnea    Thyroid disease      Family History  Problem Relation Age of Onset   Heart disease Mother    Heart disease Father      Social History:  reports that he has quit smoking. His smoking use included cigarettes. He has a 0.75 pack-year smoking history. He has never used smokeless tobacco. He reports that he does not drink alcohol and does not use drugs.   Exam: Current vital signs: BP (!) 161/70  (BP Location: Left Arm)   Pulse 63   Temp 98 F (36.7 C)   Resp 18   Ht 5\' 8"  (1.727 m)   Wt 104.3 kg   SpO2 93%   BMI 34.97 kg/m  Vital signs in last 24 hours: Temp:  [97.6 F (36.4 C)-98.8 F (37.1 C)] 98 F (36.7 C) (07/19 1523) Pulse Rate:  [56-67] 63 (07/19 1523) Resp:  [14-20] 18 (07/19 1523) BP: (104-200)/(55-96) 161/70 (07/19 1523) SpO2:  [92 %-100 %] 93 % (07/19 1523) Weight:  [104.3 kg] 104.3 kg (07/18 1915)   Physical Exam  Constitutional: Appears well-developed and well-nourished.  Psych: Affect appropriate to situation Eyes: No scleral injection HENT: No OP obstruction MSK: no joint deformities.  Cardiovascular: Normal rate and regular rhythm.  Respiratory: Effort normal, non-labored breathing GI: Soft.  No distension. There is no tenderness.  Skin: WDI  Neuro: Mental Status: Patient is awake, alert, oriented to person, place, month, year, and situation. Patient is able to give a clear and coherent history. No signs of neglect, he does have occasional mild word finding difficulty and has to asked me to repeat things, unclear if due to hearing difficulty or language Cranial Nerves: II: Visual Fields are full. Pupils are equal, round, and reactive to light.   III,IV, VI: EOMI without ptosis or diploplia.  V: Facial sensation is symmetric to temperature VII: Facial  movement is symmetric.  VIII: hearing is intact to voice X: Uvula elevates symmetrically XI: Shoulder shrug is symmetric. XII: tongue is midline without atrophy or fasciculations.  Motor: Tone is normal. Bulk is normal. 5/5 strength was present in all four extremities.  Sensory: Sensation is symmetric to light touch and temperature in the arms and legs.Cerebellar: FNF are intact bilaterally      I have reviewed labs in epic and the results pertinent to this consultation are: LDL 42 A1c 6.9  I have reviewed the images obtained: CT head-left parietal hypointensity consistent with  stroke Echo without embolic source  Impression: 75 year old male with cute onset aphasia with CT finding most consistent with stroke.  I do think further characterization of this finding with MRI would be prudent especially given the degree of improvement that he has had since onset.  I will also order a CT angio of the head and neck  Recommendations: 1) CTA head neck 2) continue aspirin 81 mg daily, also would start Plavix 75 mg daily after 300 mg load 3) PT, OT, ST 4) telemetry 5) MRI brain  6) neurology will follow  Roland Rack, MD [facility] [phone]  If 7pm- 7am, please page neurology on call as listed in [place].

## 2020-09-27 NOTE — Progress Notes (Signed)
OT Screen Note  Patient Details Name: Tim Miranda MRN: [id number] DOB: 1945/11/23   Cancelled Treatment:    Reason Eval/Treat Not Completed: OT screened, no needs identified, will sign off. Consult received, chart reviewed. Per PT, pt at baseline independence, no functional deficits in ADL appreciated. No skilled OT indicated. Will sign off. Please re-consult if additional acute OT needs arise.   Hanley Hays, MPH, MS, OTR/L ascom [phone] 09/27/20, 9:55 AM

## 2020-09-28 LAB — CBC
HCT: 43.3 % (ref 39.0–52.0)
Hemoglobin: 15.1 g/dL (ref 13.0–17.0)
MCH: 31.8 pg (ref 26.0–34.0)
MCHC: 34.9 g/dL (ref 30.0–36.0)
MCV: 91.2 fL (ref 80.0–100.0)
Platelets: 249 10*3/uL (ref 150–400)
RBC: 4.75 MIL/uL (ref 4.22–5.81)
RDW: 12.9 % (ref 11.5–15.5)
WBC: 12 10*3/uL — ABNORMAL HIGH (ref 4.0–10.5)
nRBC: 0 % (ref 0.0–0.2)

## 2020-09-28 LAB — BASIC METABOLIC PANEL
Anion gap: 11 (ref 5–15)
BUN: 24 mg/dL — ABNORMAL HIGH (ref 8–23)
CO2: 25 mmol/L (ref 22–32)
Calcium: 9.3 mg/dL (ref 8.9–10.3)
Chloride: 102 mmol/L (ref 98–111)
Creatinine, Ser: 1.12 mg/dL (ref 0.61–1.24)
GFR, Estimated: >60 mL/min (ref >60)
Glucose, Bld: 148 mg/dL — ABNORMAL HIGH (ref 70–99)
Potassium: 4.2 mmol/L (ref 3.5–5.1)
Sodium: 138 mmol/L (ref 135–145)

## 2020-09-28 LAB — GLUCOSE, CAPILLARY
Glucose-Capillary: 178 mg/dL — ABNORMAL HIGH (ref 70–99)
Glucose-Capillary: 221 mg/dL — ABNORMAL HIGH (ref 70–99)

## 2020-09-28 LAB — MAGNESIUM: Magnesium: 2.1 mg/dL (ref 1.7–2.4)

## 2020-09-28 MED ORDER — CYANOCOBALAMIN 1000 MCG PO TABS: 1000.0000 ug | ORAL_TABLET | Freq: Every day | ORAL | 0 refills | Status: AC

## 2020-09-28 MED ORDER — ASPIRIN EC 81 MG PO TBEC: 81.0000 mg | DELAYED_RELEASE_TABLET | Freq: Every day | ORAL | Status: AC

## 2020-09-28 MED ORDER — CLOPIDOGREL BISULFATE 75 MG PO TABS: 75.0000 mg | ORAL_TABLET | Freq: Every day | ORAL | 2 refills | Status: DC

## 2020-09-28 NOTE — Progress Notes (Signed)
Subjective: Feels almost back to normal  Exam: Vitals:   09/28/20 0834 09/28/20 1215  BP: (!) 158/73 (!) 143/81  Pulse: 68 72  Resp: 18 18  Temp: 97.7 F (36.5 C) 97.7 F (36.5 C)  SpO2: 94% 95%   Gen: In bed, NAD Resp: non-labored breathing, no acute distress Abd: soft, nt  Neuro: MS: Awake, alert, able to name and repeat, mild hesitancy when speaking but improved CN: Visual fields full, face symmetric Motor: 5/5 throughout Sensory: Intact light touch  Pertinent Labs: BMP-unremarkable  Impression: 75 year old male with a history of diabetes, hypertension, hyperlipidemia who has an embolic appearing stroke on imaging.  He has an old tiny stroke in the same distribution.  His carotid stenosis is on the border of being significant, and not where I would typically think it would commonly cause embolic stroke, but with his having two strokes in the same distribution no clear other etiology it may be reasonable to consider this and discussed this with vascular surgery.  Also, I would pursue cardiac monitoring to assess for atrial fibrillation.  In the absence of any other clear etiology, I would continue dual antiplatelet for 3 weeks followed by Plavix monotherapy.  Recommendations: 1) ASA 81 mg plus Plavix 75 mg daily for 3 weeks followed by Plavix monotherapy 2) LDL goal less than 70(which he is currently at on his current dose of statin) 3) glucose control 4) would consider longer term cardiac monitoring 5) consider vascular surgery follow-up for carotid stenosis 6) he will need outpatient neurology follow-up as well.  Roland Rack, MD [facility] [phone]  If 7pm- 7am, please page neurology on call as listed in [place].

## 2020-09-28 NOTE — Discharge Summary (Signed)
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 Neurology follow-up in 4 weeks   Discharge Diagnoses:  Principal Problem:   Stroke [facility])    Active Problems:   Chronic pain syndrome   Essential hypertension   Hypothyroidism   Insulin dependent type 2 diabetes mellitus ([place])   Hyperlipidemia   Gout   CVA (cerebral vascular accident) [facility]   Discharge Condition:   Diet recommendation: Carb modified, heart healthy  Filed Weights   09/26/20 1915  Weight: 104.3 kg    History of present illness:  75 year old male with history of hypertension, diabetes, dyslipidemia presented to the ED with acute onset aphasia noted to have left MCA infarct on MRI  Hospital Course:   Acute stroke -In left MCA distribution on MRI -Admitted with acute onset aphasia which is improving -Neurology consulted, on work-up (CTA head and neck ) was noted to have 50% left ICA stenosis, on MRI imaging noted to have a tiny old stroke in the same distribution, and is unclear if 50% left ICA stenosis could be contributing. -2D echocardiogram was unremarkable -Hemoglobin A1c was 6.9, LDL was 49 -Neurology recommended to continue aspirin 81 mg daily, add Plavix 75 mg daily, continue dual antiplatelet therapy for 3 weeks followed by Plavix monotherapy. -Discussed with vascular surgery, he will follow-up with Dr. Delana Meyer in 2 weeks -Message sent to cardiology [facility]) for 30-day monitor for A. Fib, [place] clinic cardiology will arrange this. -Follow-up with neurology in 4 weeks  Type 2 diabetes mellitus -CBG stable, hemoglobin A1c was 6.9, continue Lantus and  glipizide  Hypertension -Antihypertensives held on admission for permissive hypertension in the setting of acute CVA, antihypertensives resumed at discharge   Consultations: Neurology Discussed with vascular surgery  Discharge Exam: Vitals:   09/28/20 0834 09/28/20 1215  BP: (!) 158/73 (!) 143/81  Pulse: 68 72  Resp: 18 18  Temp: 97.7 F (36.5 C) 97.7 F (36.5 C)  SpO2: 94% 95%    General: AAOx3, aphasia improving Cardiovascular: S1-S2, regular rate rhythm Respiratory: Clear  Discharge Instructions   Discharge Instructions     Ambulatory referral to Neurology   Complete by: As directed    An appointment is requested in approximately:4 weeks   Diet - low sodium heart healthy   Complete by: As directed    Diet Carb Modified   Complete by: As directed    Increase activity slowly   Complete by: As directed       Allergies as of 09/28/2020       Reactions   Atorvastatin Other (See Comments)   Isosorbide    Lisinopril    Metformin And Related         Medication List     STOP taking these medications    meloxicam 15 MG tablet Commonly known as: MOBIC   naproxen sodium 220 MG tablet Commonly known as: ALEVE       TAKE these medications    acetaminophen 500 MG tablet Commonly known as: TYLENOL Take 500 mg by mouth in the morning and at bedtime.   allopurinol 300 MG tablet Commonly known as: ZYLOPRIM Take 300 mg by mouth daily.   aspirin EC 81 MG tablet Take 1 tablet (81 mg total) by mouth  daily for 21 days.   carvedilol 12.5 MG tablet Commonly known as: COREG Take 12.5 mg by mouth 2 (two) times daily.   cholecalciferol 25 MCG (1000 UNIT) tablet Commonly known as: VITAMIN D Take 2,000 Units by mouth in the morning and at bedtime.   CINNAMON PO Take 1,000 mg by mouth daily.   citalopram 20 MG tablet Commonly known as: CELEXA TAKE ONE-HALF TABLET BY MOUTH AT BEDTIME FOR ANXIETY   clopidogrel 75 MG tablet Commonly known as:  PLAVIX Take 1 tablet (75 mg total) by mouth daily. Start taking on: September 29, 2020   Coenzyme Q10 100 MG capsule Take 100 mg by mouth daily.   cyanocobalamin 1000 MCG tablet Take 1 tablet (1,000 mcg total) by mouth daily. Start taking on: September 29, 2020   Fish Oil 1000 MG Caps Take by mouth 2 (two) times daily. What changed: Another medication with the same name was removed. Continue taking this medication, and follow the directions you see here.   fluticasone 50 MCG/ACT nasal spray Commonly known as: FLONASE INSTILL 1 SPRAY INTO EACH NOSTRIL TWO TIMES A DAY MAXIMUM 2 SPRAYS IN EACH NOSTRIL DAILY. FOR ALLERGIES   furosemide 40 MG tablet Commonly known as: Lasix Take 0.5 tablets (20 mg total) by mouth daily as needed (leg swelling or weight gain of 3lb or more).   gabapentin 300 MG capsule Commonly known as: Neurontin 300 mg qAM, 600 mg QHS   glipiZIDE 10 MG tablet Commonly known as: GLUCOTROL Take 5 mg by mouth 2 (two) times daily before a meal.   insulin glargine 100 UNIT/ML injection Commonly known as: LANTUS 10 Units.   levothyroxine 50 MCG tablet Commonly known as: SYNTHROID Take 50 mcg by mouth daily before breakfast.   losartan 50 MG tablet Commonly known as: COZAAR Take 100 mg by mouth daily.   MENS PROSTATE HEALTH FORMULA PO Take 1 tablet by mouth daily.   montelukast 10 MG tablet Commonly known as: SINGULAIR Take 1 tablet by mouth at bedtime.   multivitamins ther. w/minerals Tabs tablet Take 1 tablet by mouth daily.   Onglyza 5 MG Tabs tablet Generic drug: saxagliptin HCl Take 5 mg by mouth daily.   pravastatin 80 MG tablet Commonly known as: PRAVACHOL Take 80 mg by mouth at bedtime.   Thera-Gesic 0.5-15 % Crea APPLY SMALL AMOUNT TOPICALLY FOUR TIMES A DAY AS NEEDED FOR PAIN ( AVOID FACE & EYES, WASH HANDS AFTER USING). FOR JOINT PAIN       Allergies  Allergen Reactions   Atorvastatin Other (See Comments)   Isosorbide    Lisinopril     Metformin And Related     Follow-up Information     Paraschos, Alexander, MD Follow up.   Specialty: Cardiology Why: please call [place] office this week, they will set you up with a 30day heart monitor Contact information: [facility] [place] [address] [phone]                  The results of significant diagnostics from this hospitalization (including imaging, microbiology, ancillary and laboratory) are listed below for reference.    Significant Diagnostic Studies: CT ANGIO HEAD NECK W WO CM  Result Date: 09/27/2020 CLINICAL DATA:  Expressive aphasia. EXAM: CT ANGIOGRAPHY HEAD AND NECK TECHNIQUE: Multidetector CT imaging of the head and neck was performed using the standard protocol during bolus administration of intravenous contrast. Multiplanar CT image reconstructions and MIPs were obtained to evaluate the vascular anatomy.  Carotid stenosis measurements (when applicable) are obtained utilizing NASCET criteria, using the distal internal carotid diameter as the denominator. CONTRAST:  41mL OMNIPAQUE IOHEXOL 350 MG/ML SOLN COMPARISON:  Head CT 09/26/2020 FINDINGS: CT HEAD FINDINGS Brain: An approximately 3 cm acute to subacute left temporoparietal infarct is unchanged. No new infarct, acute intracranial hemorrhage, mass, midline shift, or extra-axial fluid collection is identified. Mild cerebral atrophy is within normal limits for age. Vascular: Calcified atherosclerosis at the skull base. Skull: No fracture or suspicious osseous lesion. Sinuses: Polyp or mucous retention cyst in the left maxillary sinus. Clear mastoid air cells. Orbits: Bilateral cataract extraction. Review of the MIP images confirms the above findings CTA NECK FINDINGS Aortic arch: Standard 3 vessel aortic arch with mild atherosclerotic plaque. No arch vessel origin stenosis. Right carotid system: Patent with a moderate amount of calcified plaque at the carotid  bifurcation. No evidence of a significant stenosis or dissection. Left carotid system: Patent with a moderate amount of mixed soft and calcified plaque at the carotid bifurcation resulting in 50% proximal ICA stenosis. Vertebral arteries: Patent and codominant without evidence of a stenosis, dissection, or significant atherosclerosis. Skeleton: Moderate cervical disc degeneration. Other neck: No evidence of cervical lymphadenopathy or mass. Upper chest: Clear lung apices. Review of the MIP images confirms the above findings CTA HEAD FINDINGS Anterior circulation: The internal carotid arteries are patent from skull base to carotid termini with mild atherosclerotic plaque bilaterally not resulting in a significant stenosis. ACAs and MCAs are patent without evidence of a proximal branch occlusion or significant proximal stenosis. No aneurysm is identified. Posterior circulation: The intracranial vertebral arteries are widely patent to the basilar. The basilar artery is widely patent. Posterior communicating arteries are diminutive or absent. Both PCAs are patent without evidence of a significant proximal stenosis. No aneurysm is identified. Venous sinuses: Hypoplastic but patent left transverse sinus. Absent opacification of the left sigmoid sinus and jugular bulb favored to be due to contrast timing and preferential drainage through the right-sided system. Anatomic variants: None of significance. Review of the MIP images confirms the above findings IMPRESSION: 1. Unchanged acute to subacute left temporoparietal infarct. No intracranial hemorrhage. 2. Mild intracranial atherosclerosis without a large vessel occlusion or significant proximal intracranial stenosis. 3. Moderate cervical carotid atherosclerosis with a 50% stenosis of the proximal left ICA. 4. Aortic Atherosclerosis (ICD10-I70.0). Electronically Signed   By: Logan Bores M.D.   On: 09/27/2020 15:37   CT HEAD WO CONTRAST  Result Date: 09/26/2020 CLINICAL  DATA:  Aphasia confusion EXAM: CT HEAD WITHOUT CONTRAST TECHNIQUE: Contiguous axial images were obtained from the base of the skull through the vertex without intravenous contrast. COMPARISON:  CT brain 07/08/2016 FINDINGS: Brain: Hypodensity within the left parietal lobe consistent with acute to subacute infarct. No hemorrhage, significant mass effect or midline shift. Atrophy. Nonenlarged ventricles Vascular: No hyperdense vessels.  Carotid vascular calcification Skull: Normal. Negative for fracture or focal lesion. Sinuses/Orbits: No acute finding. Mucous retention cysts in the left maxillary sinus Other: None IMPRESSION: 1. Focal hypodensity within the left parietal lobe consistent with acute to subacute infarct. Negative for hemorrhage. 2. Atrophy Electronically Signed   By: Donavan Foil M.D.   On: 09/26/2020 17:23   MR BRAIN WO CONTRAST  Result Date: 09/27/2020 CLINICAL DATA:  Stroke follow-up.  Speech disturbance. EXAM: MRI HEAD WITHOUT CONTRAST TECHNIQUE: Multiplanar, multiecho pulse sequences of the brain and surrounding structures were obtained without intravenous contrast. COMPARISON:  Head CT/CTA 09/27/2020 FINDINGS: Brain: As seen on the earlier  CT, there is an approximately 3 cm early subacute infarct involving cortex and white matter near the left temporoparietal junction. There is a small amount of associated petechial hemorrhage. Additional subcentimeter early subacute infarcts are noted more superiorly in the left parietal lobe. A subcentimeter chronic infarct is noted in the left precentral gyrus. Mild cerebral atrophy is within normal limits for age. No mass, midline shift, or extra-axial fluid collection is identified. Vascular: Major intracranial vascular flow voids are preserved. Skull and upper cervical spine: Unremarkable bone marrow signal. Sinuses/Orbits: Bilateral cataract extraction. Mucous retention cyst or polyp in the left maxillary sinus. Clear mastoid air cells. Other: None.  IMPRESSION: 1. Early subacute posterior left MCA infarct. 2. Small chronic left precentral gyrus infarct. Electronically Signed   By: Logan Bores M.D.   On: 09/27/2020 18:45   ECHOCARDIOGRAM COMPLETE  Result Date: 09/27/2020    ECHOCARDIOGRAM REPORT   Patient Name:   Tim Miranda Bendickson Date of Exam: 09/27/2020 Medical Rec #:  [id number]      Height:       68.0 in Accession #:    [id number]     Weight:       230.0 lb Date of Birth:  [birth year]       BSA:          2.169 m Patient Age:    89 years       BP:           128/76 mmHg Patient Gender: M              HR:           63 bpm. Exam Location:  [facility] Procedure: 2D Echo, Cardiac Doppler and Color Doppler Indications:     Stroke I63.9  History:         Patient has prior history of Echocardiogram examinations, most                  recent 11/22/2018. Risk Factors:Diabetes, Hypertension and Sleep                  Apnea.  Sonographer:     Sherrie Sport RDCS (AE) Referring Phys:  [id number] AMY N COX Diagnosing Phys: Kate Sable MD  Sonographer Comments: Suboptimal apical window. IMPRESSIONS  1. Left ventricular ejection fraction, by estimation, is 60 to 65%. The left ventricle has normal function. The left ventricle has no regional wall motion abnormalities. There is mild left ventricular hypertrophy. Left ventricular diastolic parameters are consistent with Grade I diastolic dysfunction (impaired relaxation).  2. Right ventricular systolic function is normal. The right ventricular size is normal.  3. The mitral valve is normal in structure. No evidence of mitral valve regurgitation.  4. The aortic valve is tricuspid. Aortic valve regurgitation is not visualized. FINDINGS  Left Ventricle: Left ventricular ejection fraction, by estimation, is 60 to 65%. The left ventricle has normal function. The left ventricle has no regional wall motion abnormalities. The left ventricular internal cavity size was normal in size. There is  mild left ventricular hypertrophy. Left ventricular  diastolic parameters are consistent with Grade I diastolic dysfunction (impaired relaxation). Right Ventricle: The right ventricular size is normal. No increase in right ventricular wall thickness. Right ventricular systolic function is normal. Left Atrium: Left atrial size was normal in size. Right Atrium: Right atrial size was normal in size. Pericardium: There is no evidence of pericardial effusion. Mitral Valve: The mitral valve is normal in structure. No evidence of mitral valve regurgitation.  Tricuspid Valve: The tricuspid valve is normal in structure. Tricuspid valve regurgitation is not demonstrated. Aortic Valve: The aortic valve is tricuspid. Aortic valve regurgitation is not visualized. Aortic valve mean gradient measures 4.0 mmHg. Aortic valve peak gradient measures 6.4 mmHg. Aortic valve area, by VTI measures 2.71 cm. Pulmonic Valve: The pulmonic valve was normal in structure. Pulmonic valve regurgitation is trivial. Aorta: The aortic root is normal in size and structure. IAS/Shunts: No atrial level shunt detected by color flow Doppler.  LEFT VENTRICLE PLAX 2D LVIDd:         3.90 cm  Diastology LVIDs:         2.20 cm  LV e' medial:    4.68 cm/s LV PW:         1.40 cm  LV E/e' medial:  9.1 LV IVS:        1.80 cm  LV e' lateral:   6.09 cm/s LVOT diam:     2.10 cm  LV E/e' lateral: 7.0 LV SV:         61 LV SV Index:   28 LVOT Area:     3.46 cm  RIGHT VENTRICLE RV S prime:     7.83 cm/s TAPSE (M-mode): 4.2 cm LEFT ATRIUM             Index       RIGHT ATRIUM           Index LA diam:        3.80 cm 1.75 cm/m  RA Area:     21.90 cm LA Vol (A2C):   51.5 ml 23.75 ml/m RA Volume:   63.60 ml  29.32 ml/m LA Vol (A4C):   65.4 ml 30.15 ml/m LA Biplane Vol: 59.0 ml 27.20 ml/m  AORTIC VALVE                   PULMONIC VALVE AV Area (Vmax):    2.37 cm    PV Vmax:        1.02 m/s AV Area (Vmean):   2.33 cm    PV Peak grad:   4.2 mmHg AV Area (VTI):     2.71 cm    RVOT Peak grad: 2 mmHg AV Vmax:           126.00  cm/s AV Vmean:          90.400 cm/s AV VTI:            0.224 m AV Peak Grad:      6.4 mmHg AV Mean Grad:      4.0 mmHg LVOT Vmax:         86.20 cm/s LVOT Vmean:        60.900 cm/s LVOT VTI:          0.175 m LVOT/AV VTI ratio: 0.78  AORTA Ao Root diam: 3.30 cm MITRAL VALVE               TRICUSPID VALVE MV Area (PHT): 3.15 cm    TR Peak grad:   12.4 mmHg MV Decel Time: 241 msec    TR Vmax:        176.00 cm/s MV E velocity: 42.80 cm/s MV A velocity: 82.30 cm/s  SHUNTS MV E/A ratio:  0.52        Systemic VTI:  0.18 m  Systemic Diam: 2.10 cm Kate Sable MD Electronically signed by Kate Sable MD Signature Date/Time: 09/27/2020/2:23:04 PM    Final     Microbiology: Recent Results (from the past 240 hour(s))  Resp Panel by RT-PCR (Flu A&B, Covid) Nasopharyngeal Swab     Status: None   Collection Time: 09/26/20  8:32 PM   Specimen: Nasopharyngeal Swab; Nasopharyngeal(NP) swabs in vial transport medium  Result Value Ref Range Status   SARS Coronavirus 2 by RT PCR NEGATIVE NEGATIVE Final    Comment: (NOTE) SARS-CoV-2 target nucleic acids are NOT DETECTED.  The SARS-CoV-2 RNA is generally detectable in upper respiratory specimens during the acute phase of infection. The lowest concentration of SARS-CoV-2 viral copies this assay can detect is 138 copies/mL. A negative result does not preclude SARS-Cov-2 infection and should not be used as the sole basis for treatment or other patient management decisions. A negative result may occur with  improper specimen collection/handling, submission of specimen other than nasopharyngeal swab, presence of viral mutation(s) within the areas targeted by this assay, and inadequate number of viral copies(<138 copies/mL). A negative result must be combined with clinical observations, patient history, and epidemiological information. The expected result is Negative.  Fact Sheet for Patients:   EntrepreneurPulse.com.au  Fact Sheet for Healthcare Providers:  IncredibleEmployment.be  This test is no t yet approved or cleared by the [place] FDA and  has been authorized for detection and/or diagnosis of SARS-CoV-2 by FDA under an Emergency Use Authorization (EUA). This EUA will remain  in effect (meaning this test can be used) for the duration of the COVID-19 declaration under Section 564(b)(1) of the Act, 21 U.S.C.section 360bbb-3(b)(1), unless the authorization is terminated  or revoked sooner.       Influenza A by PCR NEGATIVE NEGATIVE Final   Influenza B by PCR NEGATIVE NEGATIVE Final    Comment: (NOTE) The Xpert Xpress SARS-CoV-2/FLU/RSV plus assay is intended as an aid in the diagnosis of influenza from Nasopharyngeal swab specimens and should not be used as a sole basis for treatment. Nasal washings and aspirates are unacceptable for Xpert Xpress SARS-CoV-2/FLU/RSV testing.  Fact Sheet for Patients: EntrepreneurPulse.com.au  Fact Sheet for Healthcare Providers: IncredibleEmployment.be  This test is not yet approved or cleared by the [place] FDA and has been authorized for detection and/or diagnosis of SARS-CoV-2 by FDA under an Emergency Use Authorization (EUA). This EUA will remain in effect (meaning this test can be used) for the duration of the COVID-19 declaration under Section 564(b)(1) of the Act, 21 U.S.C. section 360bbb-3(b)(1), unless the authorization is terminated or revoked.  Performed at [facility], [address] [place], [place] [address]      Labs: Basic Metabolic Panel: Recent Labs  Lab 09/26/20 1634 09/27/20 0449 09/28/20 0505  NA 138 139 138  K 4.0 4.0 4.2  CL 103 103 102  CO2 24 26 25   GLUCOSE 97 127* 148*  BUN 17 17 24*  CREATININE 0.87 0.95 1.12  CALCIUM 9.8 9.4 9.3  MG  --   --  2.1   Liver Function Tests: Recent Labs  Lab  09/26/20 1634  AST 31  ALT 32  ALKPHOS 43  BILITOT 0.6  PROT 7.4  ALBUMIN 4.5   No results for input(s): LIPASE, AMYLASE in the last 168 hours. No results for input(s): AMMONIA in the last 168 hours. CBC: Recent Labs  Lab 09/26/20 1634 09/27/20 0449 09/28/20 0505  WBC 10.4 12.8* 12.0*  NEUTROABS 5.3  --   --  HGB 15.3 14.9 15.1  HCT 43.6 42.2 43.3  MCV 91.4 92.7 91.2  PLT 284 247 249   Cardiac Enzymes: No results for input(s): CKTOTAL, CKMB, CKMBINDEX, TROPONINI in the last 168 hours. BNP: BNP (last 3 results) No results for input(s): BNP in the last 8760 hours.  ProBNP (last 3 results) No results for input(s): PROBNP in the last 8760 hours.  CBG: Recent Labs  Lab 09/27/20 1242 09/27/20 1521 09/27/20 2118 09/28/20 0733 09/28/20 1217  GLUCAP 202* 195* 126* 178* 221*       Signed:  Domenic Polite MD.  [facility] 09/28/2020, 1:30 PM

## 2020-09-28 NOTE — Progress Notes (Signed)
Nutrition Education Note  RD consulted for nutrition education regarding HH and DM diet education.   75 year old male with history of hypertension, diabetes, dyslipidemia presented to the ED with acute onset aphasia noted to have left MCA infarct on MRI  RD provided "Heart Healthy Diet Education" handout from the Academy of Nutrition and Dietetics. Reviewed patient's dietary recall. Provided examples on ways to decrease saturated fats and sodium intake in diet. Discouraged intake of processed foods and use of salt shaker. Encouraged fresh fruits and vegetables as well as whole grain sources of carbohydrates to maximize fiber intake.   RD discussed why it is important for patient to adhere to diet recommendations.  RD also provided "Nutrition with Type II Diabetes" handout from the Academy of Nutrition and Dietetics. Discussed different food groups and their effects on blood sugar, emphasizing carbohydrate-containing foods. Provided list of carbohydrates and recommended serving sizes of common foods.  Discussed importance of controlled and consistent carbohydrate intake throughout the day. Provided examples of ways to balance meals/snacks and encouraged intake of high-fiber, whole grain complex carbohydrates.   Teach back method used.  Expect good compliance.  Body mass index is 34.98 kg/m. Pt meets criteria for obesity based on current BMI.  Current diet order is HH/CHO, patient is consuming approximately 100% of meals at this time. Labs and medications reviewed. No further nutrition interventions warranted at this time. RD contact information provided. If additional nutrition issues arise, please re-consult RD.    Koleen Distance MS, RD, LDN Please refer to [facility] for RD and/or RD on-call/weekend/after hours pager

## 2020-10-13 ENCOUNTER — Ambulatory Visit (INDEPENDENT_AMBULATORY_CARE_PROVIDER_SITE_OTHER): Payer: Medicare Other | Admitting: Vascular Surgery

## 2020-10-13 ENCOUNTER — Other Ambulatory Visit: Payer: Self-pay

## 2020-10-13 DIAGNOSIS — Z135 Encounter for screening for eye and ear disorders: Secondary | ICD-10-CM | POA: Insufficient documentation

## 2020-10-13 DIAGNOSIS — Z87891 Personal history of nicotine dependence: Secondary | ICD-10-CM | POA: Insufficient documentation

## 2020-10-13 DIAGNOSIS — Z461 Encounter for fitting and adjustment of hearing aid: Secondary | ICD-10-CM | POA: Insufficient documentation

## 2020-10-13 DIAGNOSIS — F32A Depression, unspecified: Secondary | ICD-10-CM | POA: Insufficient documentation

## 2020-10-13 DIAGNOSIS — H35039 Hypertensive retinopathy, unspecified eye: Secondary | ICD-10-CM | POA: Insufficient documentation

## 2020-10-13 DIAGNOSIS — H524 Presbyopia: Secondary | ICD-10-CM | POA: Insufficient documentation

## 2020-10-13 DIAGNOSIS — H269 Unspecified cataract: Secondary | ICD-10-CM | POA: Insufficient documentation

## 2020-10-13 DIAGNOSIS — I6529 Occlusion and stenosis of unspecified carotid artery: Secondary | ICD-10-CM

## 2020-10-13 DIAGNOSIS — G4733 Obstructive sleep apnea (adult) (pediatric): Secondary | ICD-10-CM | POA: Insufficient documentation

## 2020-10-13 DIAGNOSIS — F419 Anxiety disorder, unspecified: Secondary | ICD-10-CM | POA: Insufficient documentation

## 2020-10-13 DIAGNOSIS — H04123 Dry eye syndrome of bilateral lacrimal glands: Secondary | ICD-10-CM | POA: Insufficient documentation

## 2020-10-13 DIAGNOSIS — H04129 Dry eye syndrome of unspecified lacrimal gland: Secondary | ICD-10-CM | POA: Insufficient documentation

## 2020-10-13 DIAGNOSIS — Z760 Encounter for issue of repeat prescription: Secondary | ICD-10-CM | POA: Insufficient documentation

## 2020-10-13 DIAGNOSIS — H903 Sensorineural hearing loss, bilateral: Secondary | ICD-10-CM | POA: Insufficient documentation

## 2020-10-13 DIAGNOSIS — H2510 Age-related nuclear cataract, unspecified eye: Secondary | ICD-10-CM | POA: Insufficient documentation

## 2020-10-13 NOTE — Progress Notes (Signed)
MRN : [id number]  Tim Miranda is a 75 y.o. (1945-06-04) male who presents with chief complaint of  Chief Complaint  Patient presents with   Follow-up    [facility] 2 week FU carotid stenosis   .  History of Present Illness:   The patient is seen for follow up evaluation of carotid stenosis. The carotid stenosis was identified by CT angiogram during an ER visit which was a work up for a CVA.   The patient denies amaurosis fugax. There is no recent history of TIA symptoms or focal motor deficits. 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  The patient is taking enteric-coated aspirin 81 mg daily.  There is no history of migraine headaches. There is no history of seizures.  The patient has a history of coronary artery disease, no recent episodes of angina or shortness of breath. The patient denies PAD or claudication symptoms. There is a history of hyperlipidemia which is being treated with a statin.    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    No outpatient medications have been marked as taking for the 10/13/20 encounter (Office Visit) with Delana Meyer, Dolores Lory, MD.    Past Medical History:  Diagnosis Date   Actinic keratosis    Arthritis    Coronary artery disease    Degenerative disc disease, lumbar    Depression    Diabetes mellitus without complication ([place])    Dysplastic nevus 10/27/2018   Right low back 4.0cm lat to spine. Mild atypia, limited margins free.   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 to spine above waistline. Moderate atypia, limited margins free.    GERD (gastroesophageal reflux disease)    Gout    Hx of CABG    Hypercholesteremia    Hypertension    PLMD (periodic limb movement disorder)    Sleep apnea    Thyroid disease     Past Surgical History:  Procedure Laterality Date   BACK SURGERY     CARPAL TUNNEL RELEASE     COLONOSCOPY WITH PROPOFOL N/A 09/19/2016   Procedure: COLONOSCOPY WITH PROPOFOL;  Surgeon: Manya Silvas, MD;  Location: [facility] ENDOSCOPY;  Service: Endoscopy;  Laterality: N/A;   CORONARY ARTERY BYPASS GRAFT     ESOPHAGOGASTRODUODENOSCOPY (EGD) WITH PROPOFOL N/A 09/19/2016   Procedure: ESOPHAGOGASTRODUODENOSCOPY (EGD) WITH PROPOFOL;  Surgeon: Manya Silvas, MD;  Location: [facility] ENDOSCOPY;  Service: Endoscopy;  Laterality: N/A;   EYE SURGERY     SHOULDER SURGERY      Social History Social History   Tobacco Use   Smoking status: Former    Packs/day: 0.25    Years: 3.00    Pack years: 0.75    Types: Cigarettes   Smokeless tobacco: Never  Vaping Use   Vaping Use: Never used  Substance Use Topics   Alcohol use: No   Drug use: No    Family History Family History  Problem Relation Age of Onset   Heart disease Mother    Heart disease Father     Allergies  Allergen Reactions   Atorvastatin Other (See Comments)   Isosorbide    Lisinopril    Metformin And Related      REVIEW OF SYSTEMS (Negative unless checked)  Constitutional: '[]'$ Weight loss  '[]'$ Fever  '[]'$ Chills Cardiac: '[]'$ Chest pain   '[]'$ Chest pressure   '[]'$ Palpitations   '[]'$ Shortness of breath when laying flat   '[]'$ Shortness of breath with exertion. Vascular:  '[]'$ Pain in legs with walking   '[]'$   Pain in legs at rest  '[]'$ History of DVT   '[]'$ Phlebitis   '[]'$ Swelling in legs   '[]'$ Varicose veins   '[]'$ Non-healing ulcers Pulmonary:   '[]'$ Uses home oxygen   '[]'$ Productive cough   '[]'$ Hemoptysis   '[]'$ Wheeze  '[]'$ COPD   '[]'$ Asthma Neurologic:  '[]'$ Dizziness   '[]'$ Seizures   '[x]'$ History of stroke   '[]'$ History of TIA  '[x]'$ Aphasia   '[]'$ Vissual changes   '[]'$ Weakness or numbness in arm   '[]'$ Weakness or numbness in leg Musculoskeletal:   '[]'$ Joint swelling   '[]'$ Joint pain   '[]'$ Low back pain Hematologic:  '[]'$ Easy bruising  '[]'$ Easy bleeding   '[]'$ Hypercoagulable state   '[]'$ Anemic Gastrointestinal:  '[]'$ Diarrhea   '[]'$ Vomiting  '[]'$ Gastroesophageal reflux/heartburn   '[]'$ Difficulty swallowing. Genitourinary:  '[]'$ Chronic kidney disease   '[]'$ Difficult urination  '[]'$ Frequent urination   '[]'$ Blood  in urine Skin:  '[]'$ Rashes   '[]'$ Ulcers  Psychological:  '[]'$ History of anxiety   '[]'$  History of major depression.  Physical Examination  Vitals:   10/13/20 1323  BP: 132/79  Pulse: 66  Weight: 230 lb (104.3 kg)  Height: '5\' 8"'$  (1.727 m)   Body mass index is 34.97 kg/m. Gen: WD/WN, NAD Head: [place]/AT, No temporalis wasting.  Ear/Nose/Throat: Hearing grossly intact, nares w/o erythema or drainage Eyes: PER, EOMI, sclera nonicteric.  Neck: Supple, no large masses.   Pulmonary:  Good air movement, no audible wheezing bilaterally, no use of accessory muscles.  Cardiac: RRR, no JVD Vascular:   no carotid bruits Vessel Right Left  Radial Palpable Palpable  Brachial Palpable Palpable  Carotid Palpable Palpable  Gastrointestinal: Non-distended. No guarding/no peritoneal signs.  Musculoskeletal: M/S 5/5 throughout.  No deformity or atrophy.  Neurologic: CN 2-12 intact. Symmetrical.  Speech is fluent. Motor exam as listed above. Psychiatric: Judgment intact, Mood & affect appropriate for pt's clinical situation. Dermatologic: No rashes or ulcers noted.  No changes consistent with cellulitis. Lymph : No lichenification or skin changes of chronic lymphedema.  CBC Lab Results  Component Value Date   WBC 12.0 (H) 09/28/2020   HGB 15.1 09/28/2020   HCT 43.3 09/28/2020   MCV 91.2 09/28/2020   PLT 249 09/28/2020    BMET    Component Value Date/Time   NA 138 09/28/2020 0505   K 4.2 09/28/2020 0505   CL 102 09/28/2020 0505   CO2 25 09/28/2020 0505   GLUCOSE 148 (H) 09/28/2020 0505   BUN 24 (H) 09/28/2020 0505   CREATININE 1.12 09/28/2020 0505   CALCIUM 9.3 09/28/2020 0505   GFRNONAA >60 09/28/2020 0505   GFRAA >60 11/22/2018 0645   Estimated Creatinine Clearance: 65.7 mL/min (by C-G formula based on SCr of 1.12 mg/dL).  COAG Lab Results  Component Value Date   INR 1.1 09/26/2020    Radiology CT ANGIO HEAD NECK W WO CM  Result Date: 09/27/2020 CLINICAL DATA:  Expressive aphasia.  EXAM: CT ANGIOGRAPHY HEAD AND NECK TECHNIQUE: Multidetector CT imaging of the head and neck was performed using the standard protocol during bolus administration of intravenous contrast. Multiplanar CT image reconstructions and MIPs were obtained to evaluate the vascular anatomy. Carotid stenosis measurements (when applicable) are obtained utilizing NASCET criteria, using the distal internal carotid diameter as the denominator. CONTRAST:  33m OMNIPAQUE IOHEXOL 350 MG/ML SOLN COMPARISON:  Head CT 09/26/2020 FINDINGS: CT HEAD FINDINGS Brain: An approximately 3 cm acute to subacute left temporoparietal infarct is unchanged. No new infarct, acute intracranial hemorrhage, mass, midline shift, or extra-axial fluid collection is identified. Mild cerebral atrophy is within normal limits for age.  Vascular: Calcified atherosclerosis at the skull base. Skull: No fracture or suspicious osseous lesion. Sinuses: Polyp or mucous retention cyst in the left maxillary sinus. Clear mastoid air cells. Orbits: Bilateral cataract extraction. Review of the MIP images confirms the above findings CTA NECK FINDINGS Aortic arch: Standard 3 vessel aortic arch with mild atherosclerotic plaque. No arch vessel origin stenosis. Right carotid system: Patent with a moderate amount of calcified plaque at the carotid bifurcation. No evidence of a significant stenosis or dissection. Left carotid system: Patent with a moderate amount of mixed soft and calcified plaque at the carotid bifurcation resulting in 50% proximal ICA stenosis. Vertebral arteries: Patent and codominant without evidence of a stenosis, dissection, or significant atherosclerosis. Skeleton: Moderate cervical disc degeneration. Other neck: No evidence of cervical lymphadenopathy or mass. Upper chest: Clear lung apices. Review of the MIP images confirms the above findings CTA HEAD FINDINGS Anterior circulation: The internal carotid arteries are patent from skull base to carotid termini  with mild atherosclerotic plaque bilaterally not resulting in a significant stenosis. ACAs and MCAs are patent without evidence of a proximal branch occlusion or significant proximal stenosis. No aneurysm is identified. Posterior circulation: The intracranial vertebral arteries are widely patent to the basilar. The basilar artery is widely patent. Posterior communicating arteries are diminutive or absent. Both PCAs are patent without evidence of a significant proximal stenosis. No aneurysm is identified. Venous sinuses: Hypoplastic but patent left transverse sinus. Absent opacification of the left sigmoid sinus and jugular bulb favored to be due to contrast timing and preferential drainage through the right-sided system. Anatomic variants: None of significance. Review of the MIP images confirms the above findings IMPRESSION: 1. Unchanged acute to subacute left temporoparietal infarct. No intracranial hemorrhage. 2. Mild intracranial atherosclerosis without a large vessel occlusion or significant proximal intracranial stenosis. 3. Moderate cervical carotid atherosclerosis with a 50% stenosis of the proximal left ICA. 4. Aortic Atherosclerosis (ICD10-I70.0). Electronically Signed   By: Logan Bores M.D.   On: 09/27/2020 15:37   CT HEAD WO CONTRAST  Result Date: 09/26/2020 CLINICAL DATA:  Aphasia confusion EXAM: CT HEAD WITHOUT CONTRAST TECHNIQUE: Contiguous axial images were obtained from the base of the skull through the vertex without intravenous contrast. COMPARISON:  CT brain 07/08/2016 FINDINGS: Brain: Hypodensity within the left parietal lobe consistent with acute to subacute infarct. No hemorrhage, significant mass effect or midline shift. Atrophy. Nonenlarged ventricles Vascular: No hyperdense vessels.  Carotid vascular calcification Skull: Normal. Negative for fracture or focal lesion. Sinuses/Orbits: No acute finding. Mucous retention cysts in the left maxillary sinus Other: None IMPRESSION: 1. Focal  hypodensity within the left parietal lobe consistent with acute to subacute infarct. Negative for hemorrhage. 2. Atrophy Electronically Signed   By: Donavan Foil M.D.   On: 09/26/2020 17:23   MR BRAIN WO CONTRAST  Result Date: 09/27/2020 CLINICAL DATA:  Stroke follow-up.  Speech disturbance. EXAM: MRI HEAD WITHOUT CONTRAST TECHNIQUE: Multiplanar, multiecho pulse sequences of the brain and surrounding structures were obtained without intravenous contrast. COMPARISON:  Head CT/CTA 09/27/2020 FINDINGS: Brain: As seen on the earlier CT, there is an approximately 3 cm early subacute infarct involving cortex and white matter near the left temporoparietal junction. There is a small amount of associated petechial hemorrhage. Additional subcentimeter early subacute infarcts are noted more superiorly in the left parietal lobe. A subcentimeter chronic infarct is noted in the left precentral gyrus. Mild cerebral atrophy is within normal limits for age. No mass, midline shift, or extra-axial fluid collection is identified.  Vascular: Major intracranial vascular flow voids are preserved. Skull and upper cervical spine: Unremarkable bone marrow signal. Sinuses/Orbits: Bilateral cataract extraction. Mucous retention cyst or polyp in the left maxillary sinus. Clear mastoid air cells. Other: None. IMPRESSION: 1. Early subacute posterior left MCA infarct. 2. Small chronic left precentral gyrus infarct. Electronically Signed   By: Logan Bores M.D.   On: 09/27/2020 18:45   ECHOCARDIOGRAM COMPLETE  Result Date: 09/27/2020    ECHOCARDIOGRAM REPORT   Patient Name:   Tim Miranda Date of Exam: 09/27/2020 Medical Rec #:  [id number]      Height:       68.0 in Accession #:    [id number]     Weight:       230.0 lb Date of Birth:  25-Aug-1945       BSA:          2.169 m Patient Age:    81 years       BP:           128/76 mmHg Patient Gender: M              HR:           63 bpm. Exam Location:  [facility] Procedure: 2D Echo, Cardiac Doppler and  Color Doppler Indications:     Stroke I63.9  History:         Patient has prior history of Echocardiogram examinations, most                  recent 11/22/2018. Risk Factors:Diabetes, Hypertension and Sleep                  Apnea.  Sonographer:     Sherrie Sport RDCS (AE) Referring Phys:  [id number] AMY N [facility] Diagnosing Phys: Kate Sable MD  Sonographer Comments: Suboptimal apical window. IMPRESSIONS  1. Left ventricular ejection fraction, by estimation, is 60 to 65%. The left ventricle has normal function. The left ventricle has no regional wall motion abnormalities. There is mild left ventricular hypertrophy. Left ventricular diastolic parameters are consistent with Grade I diastolic dysfunction (impaired relaxation).  2. Right ventricular systolic function is normal. The right ventricular size is normal.  3. The mitral valve is normal in structure. No evidence of mitral valve regurgitation.  4. The aortic valve is tricuspid. Aortic valve regurgitation is not visualized. FINDINGS  Left Ventricle: Left ventricular ejection fraction, by estimation, is 60 to 65%. The left ventricle has normal function. The left ventricle has no regional wall motion abnormalities. The left ventricular internal cavity size was normal in size. There is  mild left ventricular hypertrophy. Left ventricular diastolic parameters are consistent with Grade I diastolic dysfunction (impaired relaxation). Right Ventricle: The right ventricular size is normal. No increase in right ventricular wall thickness. Right ventricular systolic function is normal. Left Atrium: Left atrial size was normal in size. Right Atrium: Right atrial size was normal in size. Pericardium: There is no evidence of pericardial effusion. Mitral Valve: The mitral valve is normal in structure. No evidence of mitral valve regurgitation. Tricuspid Valve: The tricuspid valve is normal in structure. Tricuspid valve regurgitation is not demonstrated. Aortic Valve: The aortic  valve is tricuspid. Aortic valve regurgitation is not visualized. Aortic valve mean gradient measures 4.0 mmHg. Aortic valve peak gradient measures 6.4 mmHg. Aortic valve area, by VTI measures 2.71 cm. Pulmonic Valve: The pulmonic valve was normal in structure. Pulmonic valve regurgitation is trivial. Aorta: The aortic root is normal in size and  structure. IAS/Shunts: No atrial level shunt detected by color flow Doppler.  LEFT VENTRICLE PLAX 2D LVIDd:         3.90 cm  Diastology LVIDs:         2.20 cm  LV e' medial:    4.68 cm/s LV PW:         1.40 cm  LV E/e' medial:  9.1 LV IVS:        1.80 cm  LV e' lateral:   6.09 cm/s LVOT diam:     2.10 cm  LV E/e' lateral: 7.0 LV SV:         61 LV SV Index:   28 LVOT Area:     3.46 cm  RIGHT VENTRICLE RV S prime:     7.83 cm/s TAPSE (M-mode): 4.2 cm LEFT ATRIUM             Index       RIGHT ATRIUM           Index LA diam:        3.80 cm 1.75 cm/m  RA Area:     21.90 cm LA Vol (A2C):   51.5 ml 23.75 ml/m RA Volume:   63.60 ml  29.32 ml/m LA Vol (A4C):   65.4 ml 30.15 ml/m LA Biplane Vol: 59.0 ml 27.20 ml/m  AORTIC VALVE                   PULMONIC VALVE AV Area (Vmax):    2.37 cm    PV Vmax:        1.02 m/s AV Area (Vmean):   2.33 cm    PV Peak grad:   4.2 mmHg AV Area (VTI):     2.71 cm    RVOT Peak grad: 2 mmHg AV Vmax:           126.00 cm/s AV Vmean:          90.400 cm/s AV VTI:            0.224 m AV Peak Grad:      6.4 mmHg AV Mean Grad:      4.0 mmHg LVOT Vmax:         86.20 cm/s LVOT Vmean:        60.900 cm/s LVOT VTI:          0.175 m LVOT/AV VTI ratio: 0.78  AORTA Ao Root diam: 3.30 cm MITRAL VALVE               TRICUSPID VALVE MV Area (PHT): 3.15 cm    TR Peak grad:   12.4 mmHg MV Decel Time: 241 msec    TR Vmax:        176.00 cm/s MV E velocity: 42.80 cm/s MV A velocity: 82.30 cm/s  SHUNTS MV E/A ratio:  0.52        Systemic VTI:  0.18 m                            Systemic Diam: 2.10 cm Kate Sable MD Electronically signed by Kate Sable MD  Signature Date/Time: 09/27/2020/2:23:04 PM    Final      Assessment/Plan 1. Stenosis of carotid artery, unspecified laterality Recommend:  Given the patient's asymptomatic subcritical stenosis no further invasive testing or surgery at this time.  CT shows <50% stenosis bilaterally.  Continue antiplatelet therapy as prescribed Continue management of CAD, HTN and Hyperlipidemia Healthy heart diet,  encouraged exercise at least 4 times per week Follow up in 3 months with duplex ultrasound and physical exam   - VAS US CAROTID; Future    Hortencia Pilar, MD  10/13/2020 1:27 PM

## 2020-10-15 ENCOUNTER — Encounter (INDEPENDENT_AMBULATORY_CARE_PROVIDER_SITE_OTHER): Payer: Self-pay | Admitting: Vascular Surgery

## 2020-10-15 DIAGNOSIS — I6529 Occlusion and stenosis of unspecified carotid artery: Secondary | ICD-10-CM | POA: Insufficient documentation

## 2020-11-28 ENCOUNTER — Ambulatory Visit (INDEPENDENT_AMBULATORY_CARE_PROVIDER_SITE_OTHER): Payer: Medicare Other | Admitting: Dermatology

## 2020-11-28 ENCOUNTER — Other Ambulatory Visit: Payer: Self-pay

## 2020-11-28 DIAGNOSIS — L820 Inflamed seborrheic keratosis: Secondary | ICD-10-CM

## 2020-11-28 DIAGNOSIS — L821 Other seborrheic keratosis: Secondary | ICD-10-CM | POA: Diagnosis not present

## 2020-11-28 DIAGNOSIS — L578 Other skin changes due to chronic exposure to nonionizing radiation: Secondary | ICD-10-CM

## 2020-11-28 DIAGNOSIS — I6529 Occlusion and stenosis of unspecified carotid artery: Secondary | ICD-10-CM | POA: Diagnosis not present

## 2020-11-28 NOTE — Patient Instructions (Addendum)

## 2020-11-28 NOTE — Progress Notes (Signed)
   Follow-Up Visit   Subjective  Tim Miranda is a 75 y.o. male who presents for the following: Actinic Keratosis (4 months f/u hx of Aks on the face ). He has several areas to be evaluated.  The following portions of the chart were reviewed this encounter and updated as appropriate:   Tobacco  Allergies  Meds  Problems  Med Hx  Surg Hx  Fam Hx     Review of Systems:  No other skin or systemic complaints except as noted in HPI or Assessment and Plan.  Objective  Well appearing patient in no apparent distress; mood and affect are within normal limits.  A focused examination was performed including face,scalp,ears,hands,arms. Relevant physical exam findings are noted in the Assessment and Plan.  right side burn, left side burn  (6) (6) Erythematous keratotic or waxy stuck-on papule or plaque.    Assessment & Plan  Inflamed seborrheic keratosis right side burn, left side burn  (6)  Destruction of lesion - right side burn, left side burn  (6) Complexity: simple   Destruction method: cryotherapy   Informed consent: discussed and consent obtained   Timeout:  patient name, date of birth, surgical site, and procedure verified Lesion destroyed using liquid nitrogen: Yes   Region frozen until ice ball extended beyond lesion: Yes   Outcome: patient tolerated procedure well with no complications   Post-procedure details: wound care instructions given    Seborrheic Keratoses - Stuck-on, waxy, tan-brown papules and/or plaques  - Benign-appearing - Discussed benign etiology and prognosis. - Observe - Call for any changes  Actinic Damage - chronic, secondary to cumulative UV radiation exposure/sun exposure over time - diffuse scaly erythematous macules with underlying dyspigmentation - Recommend daily broad spectrum sunscreen SPF 30+ to sun-exposed areas, reapply every 2 hours as needed.  - Recommend staying in the shade or wearing [place] sleeves, sun glasses (UVA+UVB protection)  and wide brim hats (4-inch brim around the entire circumference of the hat). - Call for new or changing lesions.   Return in about 1 year (around 11/28/2021) for Aks .  IMarye Round, CMA, am acting as scribe for Sarina Ser, MD .  Documentation: I have reviewed the above documentation for accuracy and completeness, and I agree with the above.  Sarina Ser, MD

## 2020-12-01 ENCOUNTER — Encounter: Payer: Self-pay | Admitting: Dermatology

## 2020-12-28 ENCOUNTER — Ambulatory Visit (INDEPENDENT_AMBULATORY_CARE_PROVIDER_SITE_OTHER): Payer: Medicare Other | Admitting: Diagnostic Neuroimaging

## 2020-12-28 ENCOUNTER — Encounter: Payer: Self-pay | Admitting: Diagnostic Neuroimaging

## 2020-12-28 VITALS — BP 152/64 | HR 63 | Ht 68.0 in | Wt 233.2 lb

## 2020-12-28 DIAGNOSIS — I63412 Cerebral infarction due to embolism of left middle cerebral artery: Secondary | ICD-10-CM

## 2020-12-28 DIAGNOSIS — I6529 Occlusion and stenosis of unspecified carotid artery: Secondary | ICD-10-CM

## 2020-12-28 MED ORDER — CLOPIDOGREL BISULFATE 75 MG PO TABS: 75.0000 mg | ORAL_TABLET | Freq: Every day | ORAL | 4 refills | Status: AC

## 2020-12-28 NOTE — Patient Instructions (Signed)
Posterior left MCA stroke (July 6701; ? Embolic, unknown source; could be related to left ICA stenosis, but current degree of stenosis does not warrant stent or CEA) - continue plavix 75mg  daily, pravastatin, DM control, BP control - follow up heart monitor results; could consider implanted loop recorder in future - follow up vascular surgery for carotid monitoring

## 2020-12-28 NOTE — Progress Notes (Signed)
[facility]  PATIENT: Tim Miranda DOB: 05/24/1945  REFERRING CLINICIAN: Domenic Polite, MD HISTORY FROM: patient  REASON FOR VISIT: new consult    HISTORICAL  CHIEF COMPLAINT:  Chief Complaint  Patient presents with   New Patient (Initial Visit)    RM 6 alone here for consult- Pt reports he had a stroke back in July- reports since d/c he has been doing well. He reports he has noticed increased hand tremors since his stroke, but feels like like he has been doing well.     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  Patient had some sudden onset speech difficulty.  He went to the hospital and was admitted for stroke work-up.  Patient was discharged on aspirin and Plavix for 3 weeks followed by Plavix monotherapy.  Since that time patient is doing well.  He still has some generalized tremors.  He has had a heart monitor placed.   REVIEW OF SYSTEMS: Full 14 system review of systems performed and negative with exception of: as per HPI.  ALLERGIES: Allergies  Allergen Reactions   Atorvastatin Other (See Comments)   Isosorbide    Lisinopril    Metformin And Related     HOME MEDICATIONS: Outpatient Medications Prior to Visit  Medication Sig Dispense Refill   acetaminophen (TYLENOL) 500 MG tablet Take 500 mg by mouth in the morning and at bedtime.     allopurinol (ZYLOPRIM) 300 MG tablet Take 300 mg by mouth daily.     allopurinol (ZYLOPRIM) 300 MG tablet Take 1 tablet by mouth daily.     carvedilol (COREG) 12.5 MG tablet Take 12.5 mg by mouth 2 (two) times daily.      cholecalciferol (VITAMIN D) 25 MCG (1000 UT) tablet Take 2,000 Units by mouth in the morning and at bedtime.     CINNAMON PO Take 1,000 mg by mouth daily.     citalopram (CELEXA) 20 MG tablet TAKE ONE-HALF TABLET BY MOUTH AT BEDTIME FOR ANXIETY     Coenzyme Q10 100 MG capsule Take 100 mg  by mouth daily.      fluticasone (FLONASE) 50 MCG/ACT nasal spray INSTILL 1 SPRAY INTO EACH NOSTRIL TWO TIMES A DAY MAXIMUM 2 SPRAYS IN EACH NOSTRIL DAILY. FOR ALLERGIES     gabapentin (NEURONTIN) 300 MG capsule 300 mg qAM, 600 mg QHS 90 capsule 2   gabapentin (NEURONTIN) 300 MG capsule TAKE 1 CAPSULE BY MOUTH THREE TIMES A DAY AS NEEDED     glipiZIDE (GLUCOTROL) 10 MG tablet Take 5 mg by mouth 2 (two) times daily before a meal.     insulin glargine (LANTUS) 100 UNIT/ML injection 10 Units.     insulin glargine (LANTUS) 100 UNIT/ML injection Inject into the skin.     levothyroxine (SYNTHROID, LEVOTHROID) 50 MCG tablet Take 50 mcg by mouth daily before breakfast.     losartan (COZAAR) 100 MG tablet Take by mouth.     losartan (COZAAR) 50 MG tablet Take 100 mg by mouth daily.     Menthol-Methyl Salicylate (THERA-GESIC) 0.5-15 % CREA APPLY SMALL AMOUNT TOPICALLY FOUR TIMES A DAY AS NEEDED FOR PAIN ( AVOID FACE & EYES, WASH HANDS AFTER USING). FOR JOINT PAIN     Misc Natural Products (MENS PROSTATE HEALTH FORMULA PO) Take 1 tablet by mouth daily.     montelukast (SINGULAIR) 10 MG tablet Take 1 tablet by mouth at bedtime.  Multiple Vitamins-Minerals (MULTIVITAMINS THER. W/MINERALS) TABS tablet Take 1 tablet by mouth daily.     Omega-3 Fatty Acids (FISH OIL) 1000 MG CAPS Take by mouth 2 (two) times daily.     ONGLYZA 5 MG TABS tablet Take 5 mg by mouth daily.     pravastatin (PRAVACHOL) 80 MG tablet Take 80 mg by mouth at bedtime.      vitamin B-12 1000 MCG tablet Take 1 tablet (1,000 mcg total) by mouth daily. 30 tablet 0   clopidogrel (PLAVIX) 75 MG tablet Take 1 tablet (75 mg total) by mouth daily. 30 tablet 2   furosemide (LASIX) 40 MG tablet Take 0.5 tablets (20 mg total) by mouth daily as needed (leg swelling or weight gain of 3lb or more). 30 tablet 0   No facility-administered medications prior to visit.    PAST MEDICAL HISTORY: Past Medical History:  Diagnosis Date   Actinic keratosis     Arthritis    Coronary artery disease    Degenerative disc disease, lumbar    Depression    Diabetes mellitus without complication ([place])    Dysplastic nevus 10/27/2018   Right low back 4.0cm lat to spine. Mild atypia, limited margins free.   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 to spine above waistline. Moderate atypia, limited margins free.    GERD (gastroesophageal reflux disease)    Gout    Hx of CABG    Hypercholesteremia    Hypertension    PLMD (periodic limb movement disorder)    Sleep apnea    Thyroid disease     PAST SURGICAL HISTORY: Past Surgical History:  Procedure Laterality Date   BACK SURGERY     CARPAL TUNNEL RELEASE     COLONOSCOPY WITH PROPOFOL N/A 09/19/2016   Procedure: COLONOSCOPY WITH PROPOFOL;  Surgeon: Manya Silvas, MD;  Location: [facility] ENDOSCOPY;  Service: Endoscopy;  Laterality: N/A;   CORONARY ARTERY BYPASS GRAFT     ESOPHAGOGASTRODUODENOSCOPY (EGD) WITH PROPOFOL N/A 09/19/2016   Procedure: ESOPHAGOGASTRODUODENOSCOPY (EGD) WITH PROPOFOL;  Surgeon: Manya Silvas, MD;  Location: [facility] ENDOSCOPY;  Service: Endoscopy;  Laterality: N/A;   EYE SURGERY     SHOULDER SURGERY      FAMILY HISTORY: Family History  Problem Relation Age of Onset   Heart disease Mother    Heart disease Father     SOCIAL HISTORY: Social History   Socioeconomic History   Marital status: Married    Spouse name: Not on file   Number of children: Not on file   Years of education: Not on file   Highest education level: High school graduate  Occupational History   Occupation: retired  Tobacco Use   Smoking status: Former    Packs/day: 0.25    Years: 3.00    Pack years: 0.75    Types: Cigarettes   Smokeless tobacco: Never  Vaping Use   Vaping Use: Never used  Substance and Sexual Activity   Alcohol use: No   Drug use: No   Sexual activity: Not on file  Other Topics Concern   Not on file  Social History Narrative   Right handed    Lives  at home with wife    Some caffeine.   Social Determinants of Health   Financial Resource Strain: Not on file  Food Insecurity: Not on file  Transportation Needs: Not on file  Physical Activity: Not on file  Stress: Not on file  Social Connections: Not on file  Intimate  Partner Violence: Not on file     PHYSICAL EXAM  GENERAL EXAM/CONSTITUTIONAL: Vitals:  Vitals:   12/28/20 0946  BP: (!) 152/64  Pulse: 63  SpO2: 93%  Weight: 233 lb 4 oz (105.8 kg)  Height: 5\' 8"  (1.727 m)   Body mass index is 35.47 kg/m. Wt Readings from Last 3 Encounters:  12/28/20 233 lb 4 oz (105.8 kg)  10/13/20 230 lb (104.3 kg)  09/26/20 230 lb (104.3 kg)   Patient is in no distress; well developed, nourished and groomed; neck is supple  CARDIOVASCULAR: Examination of carotid arteries is normal; no carotid bruits Regular rate and rhythm, no murmurs Examination of peripheral vascular system by observation and palpation is normal  EYES: Ophthalmoscopic exam of optic discs and posterior segments is normal; no papilledema or hemorrhages No results found.  MUSCULOSKELETAL: Gait, strength, tone, movements noted in Neurologic exam below  NEUROLOGIC: MENTAL STATUS:  No flowsheet data found. awake, alert, oriented to person, place and time recent and remote memory intact normal attention and concentration language fluent, comprehension intact, naming intact fund of knowledge appropriate  CRANIAL NERVE:  2nd - no papilledema on fundoscopic exam 2nd, 3rd, 4th, 6th - pupils equal and reactive to light, visual fields full to confrontation, extraocular muscles intact, no nystagmus 5th - facial sensation symmetric 7th - facial strength symmetric 8th - hearing intact 9th - palate elevates symmetrically, uvula midline 11th - shoulder shrug symmetric 12th - tongue protrusion midline  MOTOR:  normal bulk and tone, full strength in the BUE, BLE MILD POSTURAL AND ACTION TREMOR IN BUE  SENSORY:   normal and symmetric to light touch, temperature, vibration  COORDINATION:  finger-nose-finger, fine finger movements normal  REFLEXES:  deep tendon reflexes TRACE and symmetric  GAIT/STATION:  narrow based gait     DIAGNOSTIC DATA (LABS, IMAGING, TESTING) - I reviewed patient records, labs, notes, testing and imaging myself where available.  Lab Results  Component Value Date   WBC 12.0 (H) 09/28/2020   HGB 15.1 09/28/2020   HCT 43.3 09/28/2020   MCV 91.2 09/28/2020   PLT 249 09/28/2020      Component Value Date/Time   NA 138 09/28/2020 0505   K 4.2 09/28/2020 0505   CL 102 09/28/2020 0505   CO2 25 09/28/2020 0505   GLUCOSE 148 (H) 09/28/2020 0505   BUN 24 (H) 09/28/2020 0505   CREATININE 1.12 09/28/2020 0505   CALCIUM 9.3 09/28/2020 0505   PROT 7.4 09/26/2020 1634   ALBUMIN 4.5 09/26/2020 1634   AST 31 09/26/2020 1634   ALT 32 09/26/2020 1634   ALKPHOS 43 09/26/2020 1634   BILITOT 0.6 09/26/2020 1634   GFRNONAA >60 09/28/2020 0505   GFRAA >60 11/22/2018 0645   Lab Results  Component Value Date   CHOL 147 09/27/2020   HDL 29 (L) 09/27/2020   LDLCALC 42 09/27/2020   TRIG 380 (H) 09/27/2020   CHOLHDL 5.1 09/27/2020   Lab Results  Component Value Date   HGBA1C 6.9 (H) 09/27/2020   Lab Results  Component Value Date   VITAMINB12 264 09/26/2020   Lab Results  Component Value Date   TSH 2.887 09/27/2020    09/27/20 CTA head / neck 1. Unchanged acute to subacute left temporoparietal infarct. No intracranial hemorrhage. 2. Mild intracranial atherosclerosis without a large vessel occlusion or significant proximal intracranial stenosis. 3. Moderate cervical carotid atherosclerosis with a 50% stenosis of the proximal left ICA.   09/27/20 MRI brain [I reviewed images myself and agree  with interpretation. -VRP]  Brain: As seen on the earlier CT, there is an approximately 3 cm early subacute infarct involving cortex and white matter near the left  temporoparietal junction. There is a small amount of associated petechial hemorrhage. Additional subcentimeter early subacute infarcts are noted more superiorly in the left parietal lobe. A subcentimeter chronic infarct is noted in the left precentral gyrus. Mild cerebral atrophy is within normal limits for age. No mass, midline shift, or extra-axial fluid collection is identified. 1. Early subacute posterior left MCA infarct. 2. Small chronic left precentral gyrus infarct.   09/27/20 TTE  1. Left ventricular ejection fraction, by estimation, is 60 to 65%. The  left ventricle has normal function. The left ventricle has no regional  wall motion abnormalities. There is mild left ventricular hypertrophy.  Left ventricular diastolic parameters  are consistent with Grade I diastolic dysfunction (impaired relaxation).   2. Right ventricular systolic function is normal. The right ventricular  size is normal.   3. The mitral valve is normal in structure. No evidence of mitral valve  regurgitation.   4. The aortic valve is tricuspid. Aortic valve regurgitation is not  visualized.     ASSESSMENT AND PLAN  75 y.o. year old male here with:   Dx:  1. Cerebrovascular accident (CVA) due to embolism of left middle cerebral artery (HCC)      PLAN:  Posterior left MCA stroke (July 7048; ? Embolic, unknown source; could be related to left ICA stenosis, but current degree of stenosis does not warrant stent or CEA) - continue plavix 75mg  daily, pravastatin, DM control, BP control - follow up heart monitor results; could consider implanted loop recorder in future - follow up vascular surgery for carotid monitoring  Meds ordered this encounter  Medications   clopidogrel (PLAVIX) 75 MG tablet    Sig: Take 1 tablet (75 mg total) by mouth daily.    Dispense:  90 tablet    Refill:  4    Future refills per PCP (Dr. Baldemar Lenis)   Return for return to PCP.    Penni Bombard, MD 88/91/6945,  03:88 AM Certified in Neurology, Neurophysiology and Neuroimaging  [facility] [address] [place], [place] [address] [phone]

## 2021-01-02 ENCOUNTER — Encounter: Payer: Self-pay | Admitting: Diagnostic Neuroimaging

## 2021-01-19 ENCOUNTER — Ambulatory Visit (INDEPENDENT_AMBULATORY_CARE_PROVIDER_SITE_OTHER): Payer: Medicare Other | Admitting: Vascular Surgery

## 2021-01-19 ENCOUNTER — Encounter (INDEPENDENT_AMBULATORY_CARE_PROVIDER_SITE_OTHER): Payer: Medicare Other

## 2021-02-01 ENCOUNTER — Other Ambulatory Visit (INDEPENDENT_AMBULATORY_CARE_PROVIDER_SITE_OTHER): Payer: Self-pay | Admitting: Vascular Surgery

## 2021-02-01 DIAGNOSIS — I6529 Occlusion and stenosis of unspecified carotid artery: Secondary | ICD-10-CM

## 2021-02-06 ENCOUNTER — Ambulatory Visit (INDEPENDENT_AMBULATORY_CARE_PROVIDER_SITE_OTHER): Payer: Medicare Other

## 2021-02-06 ENCOUNTER — Other Ambulatory Visit: Payer: Self-pay

## 2021-02-06 ENCOUNTER — Ambulatory Visit (INDEPENDENT_AMBULATORY_CARE_PROVIDER_SITE_OTHER): Payer: Medicare Other | Admitting: Vascular Surgery

## 2021-02-06 ENCOUNTER — Encounter (INDEPENDENT_AMBULATORY_CARE_PROVIDER_SITE_OTHER): Payer: Self-pay | Admitting: Vascular Surgery

## 2021-02-06 VITALS — BP 180/82 | HR 67 | Ht 69.0 in | Wt 231.0 lb

## 2021-02-06 DIAGNOSIS — K219 Gastro-esophageal reflux disease without esophagitis: Secondary | ICD-10-CM | POA: Diagnosis not present

## 2021-02-06 DIAGNOSIS — I10 Essential (primary) hypertension: Secondary | ICD-10-CM

## 2021-02-06 DIAGNOSIS — I6529 Occlusion and stenosis of unspecified carotid artery: Secondary | ICD-10-CM | POA: Diagnosis not present

## 2021-02-06 DIAGNOSIS — I2510 Atherosclerotic heart disease of native coronary artery without angina pectoris: Secondary | ICD-10-CM | POA: Diagnosis not present

## 2021-02-06 DIAGNOSIS — E782 Mixed hyperlipidemia: Secondary | ICD-10-CM

## 2021-02-06 NOTE — Progress Notes (Signed)
MRN : [id number]  Tim Miranda is a 75 y.o. (February 05, 1946) male who presents with chief complaint of check carotid arteries.  History of Present Illness:   The patient is seen for follow up evaluation of carotid stenosis. The carotid stenosis was identified by CT angiogram during an ER visit which was a work up for a CVA.    The patient denies amaurosis fugax. There is no recent history of TIA symptoms or focal motor deficits. 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   The patient is taking enteric-coated aspirin 81 mg daily.   There is no history of migraine headaches. There is no history of seizures.   The patient has a history of coronary artery disease, no recent episodes of angina or shortness of breath. The patient denies PAD or claudication symptoms. There is a history of hyperlipidemia which is being treated with a statin.     Duplex ultrasound of the carotid arteries done today demonstrates 1 to 39% stenosis of bilateral internal carotid arteries.   Current Meds  Medication Sig   acetaminophen (TYLENOL) 500 MG tablet Take 500 mg by mouth in the morning and at bedtime.   allopurinol (ZYLOPRIM) 300 MG tablet Take 300 mg by mouth daily.   allopurinol (ZYLOPRIM) 300 MG tablet Take 1 tablet by mouth daily.   carvedilol (COREG) 12.5 MG tablet Take 12.5 mg by mouth 2 (two) times daily.    cholecalciferol (VITAMIN D) 25 MCG (1000 UT) tablet Take 2,000 Units by mouth in the morning and at bedtime.   CINNAMON PO Take 1,000 mg by mouth daily.   citalopram (CELEXA) 20 MG tablet TAKE ONE-HALF TABLET BY MOUTH AT BEDTIME FOR ANXIETY   clopidogrel (PLAVIX) 75 MG tablet Take 1 tablet (75 mg total) by mouth daily.   Coenzyme Q10 100 MG capsule Take 100 mg by mouth daily.    fluticasone (FLONASE) 50 MCG/ACT nasal spray INSTILL 1 SPRAY INTO EACH NOSTRIL TWO TIMES A DAY MAXIMUM 2 SPRAYS IN EACH NOSTRIL DAILY. FOR ALLERGIES   gabapentin (NEURONTIN) 300 MG capsule 300 mg qAM, 600 mg QHS   gabapentin  (NEURONTIN) 300 MG capsule TAKE 1 CAPSULE BY MOUTH THREE TIMES A DAY AS NEEDED   glipiZIDE (GLUCOTROL) 10 MG tablet Take 5 mg by mouth 2 (two) times daily before a meal.   insulin glargine (LANTUS) 100 UNIT/ML injection 10 Units.   insulin glargine (LANTUS) 100 UNIT/ML injection Inject into the skin.   levothyroxine (SYNTHROID, LEVOTHROID) 50 MCG tablet Take 50 mcg by mouth daily before breakfast.   losartan (COZAAR) 100 MG tablet Take by mouth.   losartan (COZAAR) 50 MG tablet Take 100 mg by mouth daily.   Menthol-Methyl Salicylate (THERA-GESIC) 0.5-15 % CREA APPLY SMALL AMOUNT TOPICALLY FOUR TIMES A DAY AS NEEDED FOR PAIN ( AVOID FACE & EYES, WASH HANDS AFTER USING). FOR JOINT PAIN   Misc Natural Products (MENS PROSTATE HEALTH FORMULA PO) Take 1 tablet by mouth daily.   montelukast (SINGULAIR) 10 MG tablet Take 1 tablet by mouth at bedtime.   Multiple Vitamins-Minerals (MULTIVITAMINS THER. W/MINERALS) TABS tablet Take 1 tablet by mouth daily.   Omega-3 Fatty Acids (FISH OIL) 1000 MG CAPS Take by mouth 2 (two) times daily.   ONGLYZA 5 MG TABS tablet Take 5 mg by mouth daily.   pravastatin (PRAVACHOL) 80 MG tablet Take 80 mg by mouth at bedtime.    vitamin B-12 1000 MCG tablet Take 1 tablet (1,000 mcg total) by mouth daily.  Past Medical History:  Diagnosis Date   Actinic keratosis    Arthritis    Coronary artery disease    Degenerative disc disease, lumbar    Depression    Diabetes mellitus without complication ([place])    Dysplastic nevus 10/27/2018   Right low back 4.0cm lat to spine. Mild atypia, limited margins free.   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 to spine above waistline. Moderate atypia, limited margins free.    GERD (gastroesophageal reflux disease)    Gout    Hx of CABG    Hypercholesteremia    Hypertension    PLMD (periodic limb movement disorder)    Sleep apnea    Thyroid disease     Past Surgical History:  Procedure Laterality Date   BACK  SURGERY     CARPAL TUNNEL RELEASE     COLONOSCOPY WITH PROPOFOL N/A 09/19/2016   Procedure: COLONOSCOPY WITH PROPOFOL;  Surgeon: Manya Silvas, MD;  Location: [facility] ENDOSCOPY;  Service: Endoscopy;  Laterality: N/A;   CORONARY ARTERY BYPASS GRAFT     ESOPHAGOGASTRODUODENOSCOPY (EGD) WITH PROPOFOL N/A 09/19/2016   Procedure: ESOPHAGOGASTRODUODENOSCOPY (EGD) WITH PROPOFOL;  Surgeon: Manya Silvas, MD;  Location: [facility] ENDOSCOPY;  Service: Endoscopy;  Laterality: N/A;   EYE SURGERY     SHOULDER SURGERY      Social History Social History   Tobacco Use   Smoking status: Former    Packs/day: 0.25    Years: 3.00    Pack years: 0.75    Types: Cigarettes   Smokeless tobacco: Never  Vaping Use   Vaping Use: Never used  Substance Use Topics   Alcohol use: No   Drug use: No    Family History Family History  Problem Relation Age of Onset   Heart disease Mother    Heart disease Father     Allergies  Allergen Reactions   Atorvastatin Other (See Comments)   Isosorbide    Lisinopril    Metformin And Related      REVIEW OF SYSTEMS (Negative unless checked)  Constitutional: [] Weight loss  [] Fever  [] Chills Cardiac: [] Chest pain   [] Chest pressure   [] Palpitations   [] Shortness of breath when laying flat   [] Shortness of breath with exertion. Vascular:  [] Pain in legs with walking   [] Pain in legs at rest  [] History of DVT   [] Phlebitis   [] Swelling in legs   [] Varicose veins   [] Non-healing ulcers Pulmonary:   [] Uses home oxygen   [] Productive cough   [] Hemoptysis   [] Wheeze  [] COPD   [] Asthma Neurologic:  [] Dizziness   [] Seizures   [x] History of stroke   [] History of TIA  [] Aphasia   [] Vissual changes   [] Weakness or numbness in arm   [] Weakness or numbness in leg Musculoskeletal:   [] Joint swelling   [] Joint pain   [] Low back pain Hematologic:  [] Easy bruising  [] Easy bleeding   [] Hypercoagulable state   [] Anemic Gastrointestinal:  [] Diarrhea   [] Vomiting  [x] Gastroesophageal  reflux/heartburn   [] Difficulty swallowing. Genitourinary:  [] Chronic kidney disease   [] Difficult urination  [] Frequent urination   [] Blood in urine Skin:  [] Rashes   [] Ulcers  Psychological:  [] History of anxiety   []  History of major depression.  Physical Examination  Vitals:   02/06/21 0903  BP: (!) 180/82  Pulse: 67  Weight: 231 lb (104.8 kg)  Height: 5\' 9"  (1.753 m)   Body mass index is 34.11 kg/m. Gen: WD/WN, NAD Head: [place]/AT, No temporalis  wasting.  Ear/Nose/Throat: Hearing grossly intact, nares w/o erythema or drainage Eyes: PER, EOMI, sclera nonicteric.  Neck: Supple, no masses.  No bruit or JVD.  Pulmonary:  Good air movement, no audible wheezing, no use of accessory muscles.  Cardiac: RRR, normal S1, S2, no Murmurs. Vascular:   Vessel Right Left  Radial Palpable Palpable  Carotid Palpable Palpable  Gastrointestinal: soft, non-distended. No guarding/no peritoneal signs.  Musculoskeletal: M/S 5/5 throughout.  No visible deformity.  Neurologic: CN 2-12 intact. Pain and light touch intact in extremities.  Symmetrical.  Speech is fluent. Motor exam as listed above. Psychiatric: Judgment intact, Mood & affect appropriate for pt's clinical situation. Dermatologic: No rashes or ulcers noted.  No changes consistent with cellulitis.   CBC Lab Results  Component Value Date   WBC 12.0 (H) 09/28/2020   HGB 15.1 09/28/2020   HCT 43.3 09/28/2020   MCV 91.2 09/28/2020   PLT 249 09/28/2020    BMET    Component Value Date/Time   NA 138 09/28/2020 0505   K 4.2 09/28/2020 0505   CL 102 09/28/2020 0505   CO2 25 09/28/2020 0505   GLUCOSE 148 (H) 09/28/2020 0505   BUN 24 (H) 09/28/2020 0505   CREATININE 1.12 09/28/2020 0505   CALCIUM 9.3 09/28/2020 0505   GFRNONAA >60 09/28/2020 0505   GFRAA >60 11/22/2018 0645   CrCl cannot be calculated (Patient's most recent lab result is older than the maximum 21 days allowed.).  COAG Lab Results  Component Value Date   INR 1.1  09/26/2020    Radiology No results found.   Assessment/Plan 1. Stenosis of carotid artery, unspecified laterality Recommend:  Given the patient's asymptomatic subcritical stenosis no further invasive testing or surgery at this time.  Duplex ultrasound shows 1-39% stenosis bilaterally.  Continue antiplatelet therapy as prescribed Continue management of CAD, HTN and Hyperlipidemia Healthy heart diet,  encouraged exercise at least 4 times per week Follow up in 12 months with duplex ultrasound and physical exam.    - VAS US CAROTID; Future  2. Atherosclerosis of native coronary artery of native heart without angina pectoris Continue cardiac and antihypertensive medications as already ordered and reviewed, no changes at this time.  Continue statin as ordered and reviewed, no changes at this time  Nitrates PRN for chest pain   3. Essential hypertension Continue antihypertensive medications as already ordered, these medications have been reviewed and there are no changes at this time.   4. Gastroesophageal reflux disease without esophagitis Continue PPI as already ordered, this medication has been reviewed and there are no changes at this time.  Avoidence of caffeine and alcohol  Moderate elevation of the head of the bed    5. Mixed hyperlipidemia Continue statin as ordered and reviewed, no changes at this time    Hortencia Pilar, MD  02/06/2021 9:07 AM

## 2021-02-07 ENCOUNTER — Encounter (INDEPENDENT_AMBULATORY_CARE_PROVIDER_SITE_OTHER): Payer: Self-pay | Admitting: Vascular Surgery

## 2021-10-08 IMAGING — MR MR LUMBAR SPINE W/O CM
5 series · 30 of 48 positions shown · non-contrast
Comparison: Lumbar spine MRI 01/30/2019

CLINICAL DATA: Chronic low back pain. Lumbar radiculopathy. Failed
back syndrome.

EXAM:
MRI THORACIC AND LUMBAR SPINE WITHOUT CONTRAST
TECHNIQUE: Multiplanar and multiecho pulse sequences of the thoracic and lumbar
spine were obtained without intravenous contrast.

[Series 1: T2 · sagittal · 4.0mm · 0.81mm/px · 6 of 17 slices shown (1 of 2)]
[im 1/17]
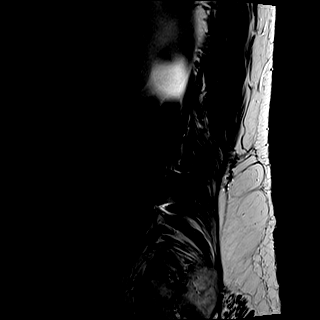
[im 4/17]
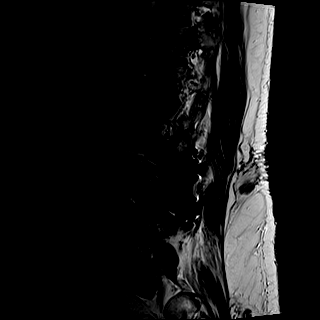
[im 7/17]
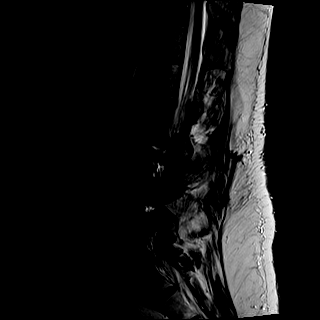
[im 10/17]
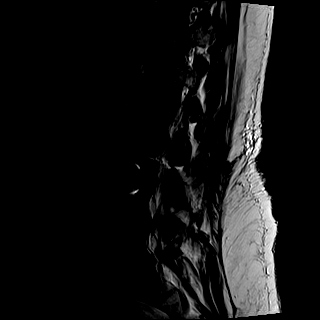
[im 13/17]
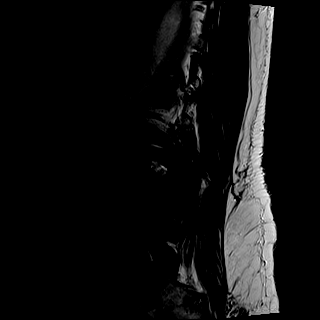
[im 17/17]
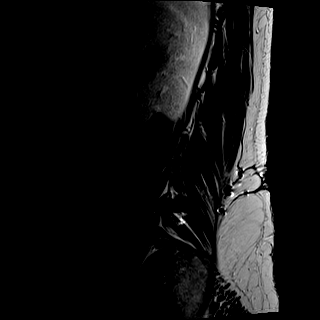

[Series 2: T1 · sagittal · 4.0mm · 0.81mm/px · 7 of 17 slices shown (1 of 2)]
[im 1/17]
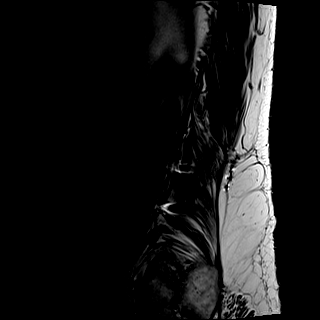
[im 3/17]
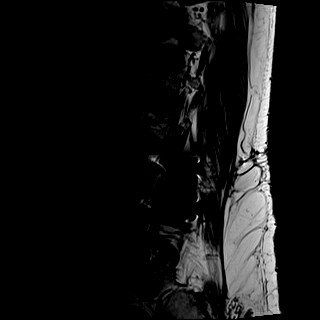
[im 6/17]
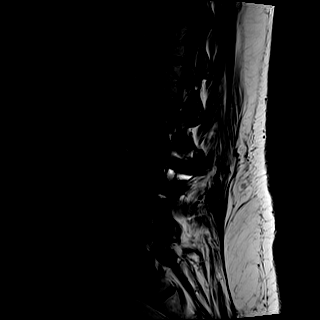
[im 9/17]
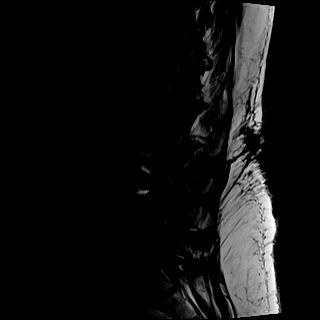
[im 11/17]
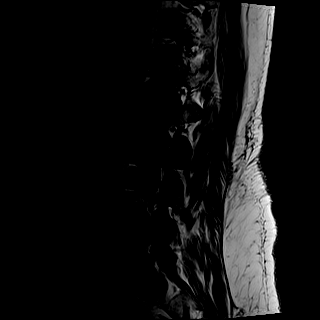
[im 14/17]
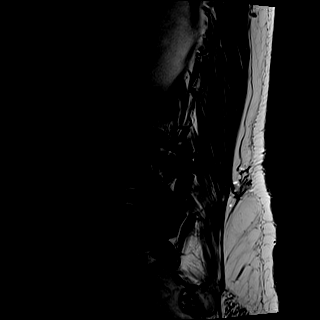
[im 17/17]
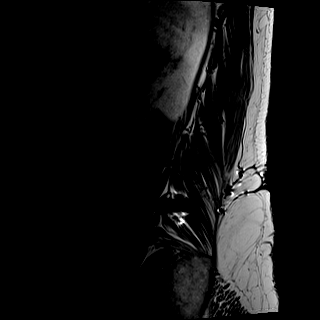

[Series 3: STIR · sagittal · 4.0mm · 0.41mm/px · 1 of 17 slices shown]
[im 1/17]
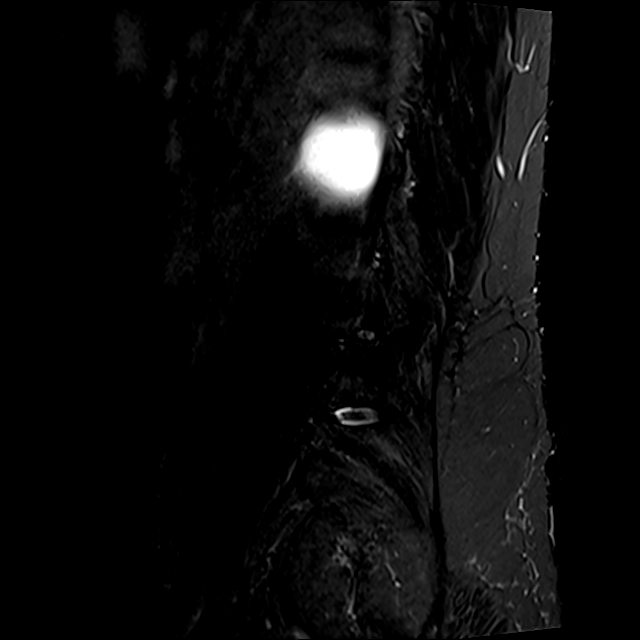

[Series 4: T2 · axial · 4.0mm · 0.78mm/px · z∈[-439,-229]mm · 8 of 37 slices shown (2 of 2)]
[im 1/37]
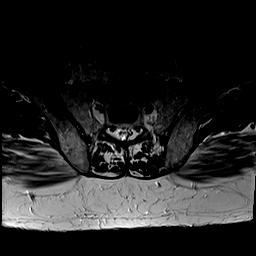
[im 6/37]
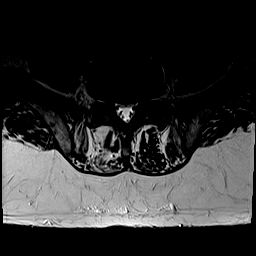
[im 12/37]
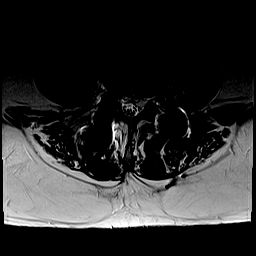
[im 17/37]
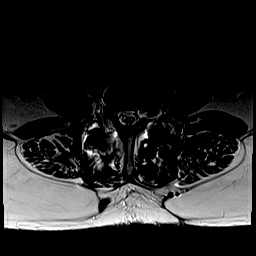
[im 20/37]
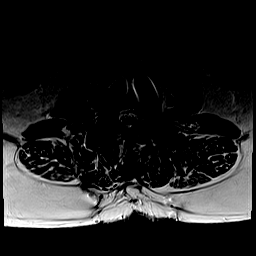
[im 25/37]
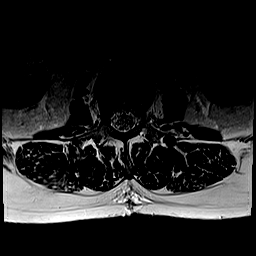
[im 31/37]
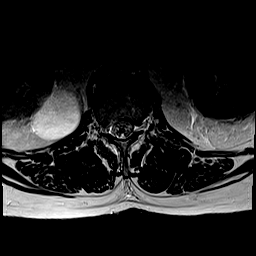
[im 37/37]
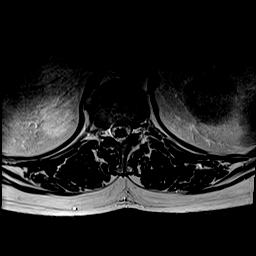

[Series 5: T1 · axial · 4.0mm · 0.39mm/px · z∈[-439,-229]mm · 8 of 37 slices shown (2 of 2)]
[im 1/37]
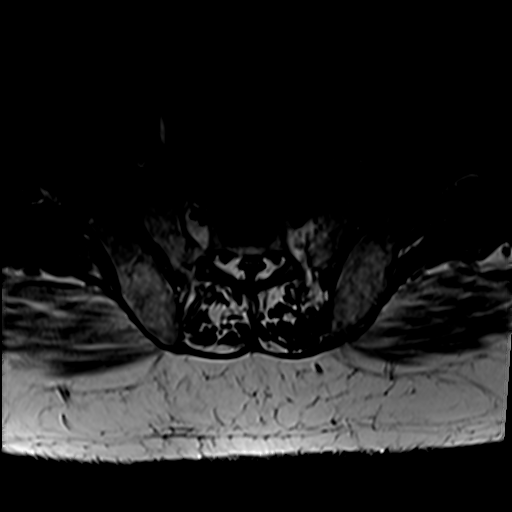
[im 6/37]
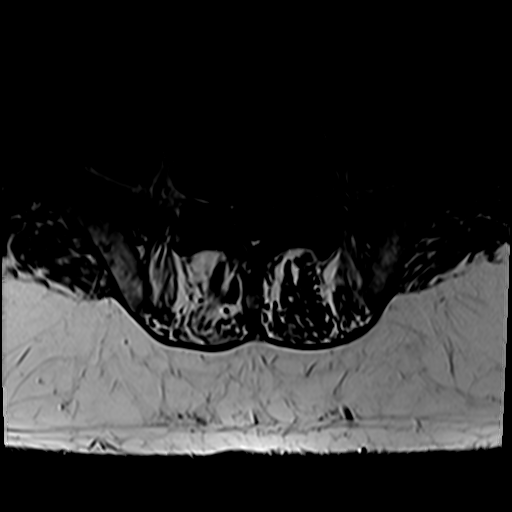
[im 12/37]
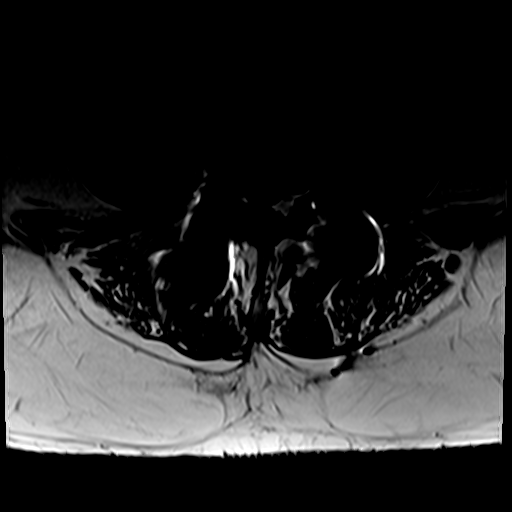
[im 17/37]
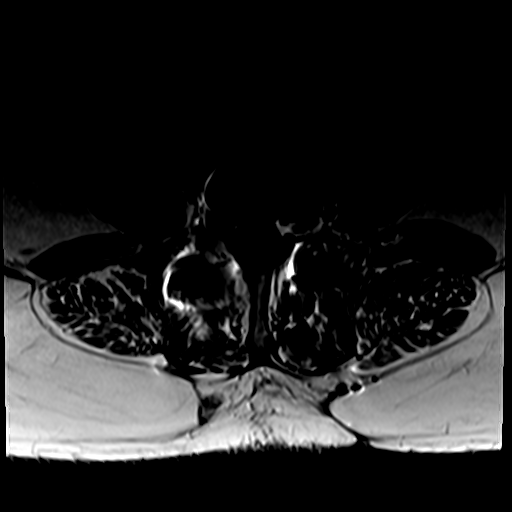
[im 20/37]
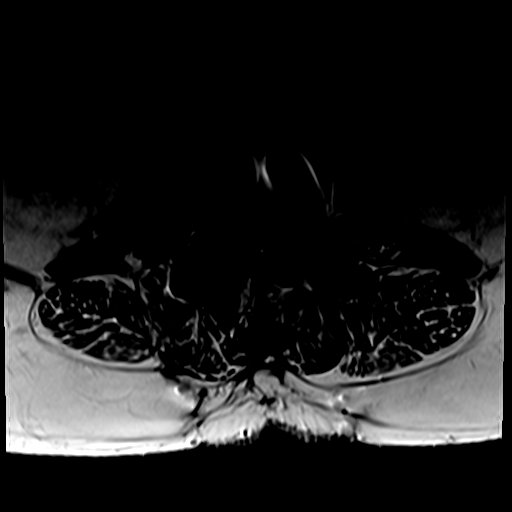
[im 25/37]
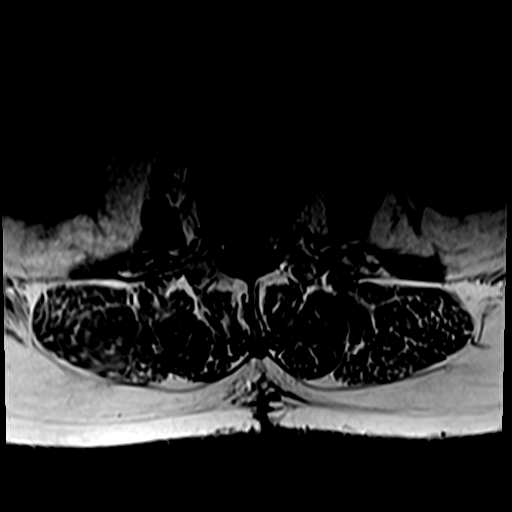
[im 31/37]
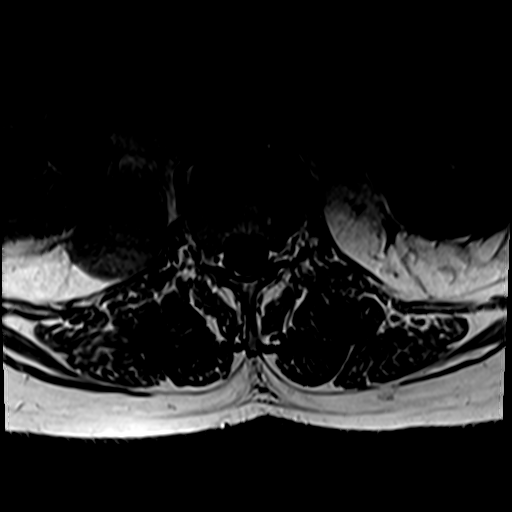
[im 37/37]
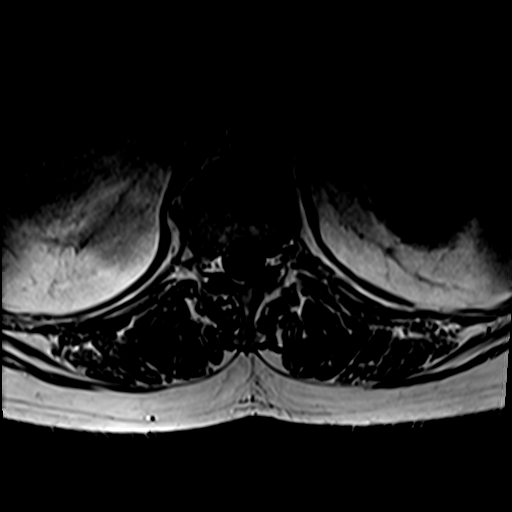

[30 of 48 positions shown; findings below may reference images not displayed]

FINDINGS: MRI THORACIC SPINE FINDINGS

Alignment:  Normal.

Vertebrae: No fracture, suspicious osseous lesion, or significant
marrow edema.

Cord:  Normal signal.

Paraspinal and other soft tissues: Partially visualized right renal
cyst.

Disc levels:

At C6-7, a broad posterior disc protrusion results in mild spinal
stenosis with mild ventral cord flattening. There is mild disc
bulging from T6-7 to T10-11 with a small left
paracentral/subarticular disc extrusion also noted at T10-11,
however there is no associated spinal stenosis or spinal cord mass
effect. There is mild left neural foraminal stenosis at T10-11.

MRI LUMBAR SPINE FINDINGS

Segmentation:  Standard.

Alignment: Mild lumbar levoscoliosis. Unchanged trace retrolisthesis
of L2 on L3.

Vertebrae: No fracture, suspicious osseous lesion, or significant
marrow edema. Previous posterior and interbody fusion at L3-4.

Conus medullaris and cauda equina: Conus extends to the L1-2 level.
Conus and cauda equina appear normal.

Paraspinal and other soft tissues: Postoperative changes in the
posterior lumbar soft tissues. No fluid collection. Slightly
increased size of a right renal cyst, now 4.8 cm.

Disc levels:

Disc desiccation throughout the lumbar spine with moderate disc
space narrowing at L4-5 and mild narrowing elsewhere.

L1-2 negative.

L2-3: Circumferential disc bulging and moderate facet and ligamentum
flavum hypertrophy result in moderate spinal stenosis and
mild-to-moderate right and mild left neural foraminal stenosis, not
significantly changed.

L3-4: Prior posterior decompression and fusion.  No stenosis.

L4-5: Circumferential disc bulging and moderate facet and ligamentum
flavum hypertrophy result in moderate spinal stenosis and moderate
right greater than left neural foraminal stenosis, unchanged.

L5-S1: Disc bulging and moderate left greater than right facet
hypertrophy result in moderate bilateral neural foraminal stenosis
without spinal stenosis, unchanged.
IMPRESSION: 1. Unchanged lumbar disc and facet degeneration with moderate spinal
stenosis at L2-3 and L4-5.
2. Moderate neural foraminal stenosis at L4-5 and L5-S1.
3. Prior L3-4 fusion without residual stenosis.
4. Mild spinal stenosis at C6-7.
5. Mild thoracic disc degeneration without spinal stenosis.

## 2021-11-25 ENCOUNTER — Emergency Department: Payer: Medicare Other

## 2021-11-25 ENCOUNTER — Ambulatory Visit
Admission: EM | Admit: 2021-11-25 | Discharge: 2021-11-25 | Disposition: A | Discharge status: Home / Self Care | Payer: Medicare Other

## 2021-11-25 ENCOUNTER — Encounter: Payer: Self-pay | Admitting: Emergency Medicine

## 2021-11-25 ENCOUNTER — Emergency Department
Admission: EM | Admit: 2021-11-25 | Discharge: 2021-11-25 | Disposition: A | Discharge status: Home / Self Care | Payer: Medicare Other | Attending: Emergency Medicine | Admitting: Emergency Medicine

## 2021-11-25 ENCOUNTER — Other Ambulatory Visit: Payer: Self-pay

## 2021-11-25 DIAGNOSIS — Z794 Long term (current) use of insulin: Secondary | ICD-10-CM | POA: Insufficient documentation

## 2021-11-25 DIAGNOSIS — Z7984 Long term (current) use of oral hypoglycemic drugs: Secondary | ICD-10-CM | POA: Insufficient documentation

## 2021-11-25 DIAGNOSIS — Z951 Presence of aortocoronary bypass graft: Secondary | ICD-10-CM | POA: Diagnosis not present

## 2021-11-25 DIAGNOSIS — I2510 Atherosclerotic heart disease of native coronary artery without angina pectoris: Secondary | ICD-10-CM | POA: Insufficient documentation

## 2021-11-25 DIAGNOSIS — H532 Diplopia: Secondary | ICD-10-CM | POA: Insufficient documentation

## 2021-11-25 DIAGNOSIS — Z79890 Hormone replacement therapy (postmenopausal): Secondary | ICD-10-CM | POA: Insufficient documentation

## 2021-11-25 DIAGNOSIS — R42 Dizziness and giddiness: Secondary | ICD-10-CM | POA: Diagnosis not present

## 2021-11-25 DIAGNOSIS — I110 Hypertensive heart disease with heart failure: Secondary | ICD-10-CM | POA: Insufficient documentation

## 2021-11-25 DIAGNOSIS — Z8673 Personal history of transient ischemic attack (TIA), and cerebral infarction without residual deficits: Secondary | ICD-10-CM | POA: Diagnosis not present

## 2021-11-25 DIAGNOSIS — Z79899 Other long term (current) drug therapy: Secondary | ICD-10-CM | POA: Insufficient documentation

## 2021-11-25 DIAGNOSIS — R519 Headache, unspecified: Secondary | ICD-10-CM

## 2021-11-25 DIAGNOSIS — E119 Type 2 diabetes mellitus without complications: Secondary | ICD-10-CM | POA: Insufficient documentation

## 2021-11-25 DIAGNOSIS — E039 Hypothyroidism, unspecified: Secondary | ICD-10-CM | POA: Diagnosis not present

## 2021-11-25 DIAGNOSIS — I5032 Chronic diastolic (congestive) heart failure: Secondary | ICD-10-CM | POA: Insufficient documentation

## 2021-11-25 HISTORY — DX: Cerebral infarction, unspecified: I63.9

## 2021-11-25 LAB — COMPREHENSIVE METABOLIC PANEL
ALT: 26 U/L (ref 0–44)
AST: 25 U/L (ref 15–41)
Albumin: 3.8 g/dL (ref 3.5–5.0)
Alkaline Phosphatase: 39 U/L (ref 38–126)
Anion gap: 7 (ref 5–15)
BUN: 19 mg/dL (ref 8–23)
CO2: 28 mmol/L (ref 22–32)
Calcium: 9.1 mg/dL (ref 8.9–10.3)
Chloride: 105 mmol/L (ref 98–111)
Creatinine, Ser: 1.1 mg/dL (ref 0.61–1.24)
GFR, Estimated: >60 mL/min (ref >60)
Glucose, Bld: 209 mg/dL — ABNORMAL HIGH (ref 70–99)
Potassium: 4 mmol/L (ref 3.5–5.1)
Sodium: 140 mmol/L (ref 135–145)
Total Bilirubin: 0.8 mg/dL (ref 0.3–1.2)
Total Protein: 6.7 g/dL (ref 6.5–8.1)

## 2021-11-25 LAB — PROTIME-INR
INR: 1.1 (ref 0.8–1.2)
Prothrombin Time: 14 seconds (ref 11.4–15.2)

## 2021-11-25 LAB — CBC
HCT: 39.3 % (ref 39.0–52.0)
Hemoglobin: 13.7 g/dL (ref 13.0–17.0)
MCH: 31.9 pg (ref 26.0–34.0)
MCHC: 34.9 g/dL (ref 30.0–36.0)
MCV: 91.6 fL (ref 80.0–100.0)
Platelets: 270 10*3/uL (ref 150–400)
RBC: 4.29 MIL/uL (ref 4.22–5.81)
RDW: 13.6 % (ref 11.5–15.5)
WBC: 8.6 10*3/uL (ref 4.0–10.5)
nRBC: 0 % (ref 0.0–0.2)

## 2021-11-25 LAB — DIFFERENTIAL
Abs Immature Granulocytes: 0.03 10*3/uL (ref 0.00–0.07)
Basophils Absolute: 0.1 10*3/uL (ref 0.0–0.1)
Basophils Relative: 1 %
Eosinophils Absolute: 0.3 10*3/uL (ref 0.0–0.5)
Eosinophils Relative: 3 %
Immature Granulocytes: 0 %
Lymphocytes Relative: 27 %
Lymphs Abs: 2.3 10*3/uL (ref 0.7–4.0)
Monocytes Absolute: 0.7 10*3/uL (ref 0.1–1.0)
Monocytes Relative: 8 %
Neutro Abs: 5.2 10*3/uL (ref 1.7–7.7)
Neutrophils Relative %: 61 %

## 2021-11-25 LAB — ETHANOL: Alcohol, Ethyl (B): <10 mg/dL (ref <10)

## 2021-11-25 LAB — APTT: aPTT: 30 seconds (ref 24–36)

## 2021-11-25 NOTE — ED Provider Notes (Signed)
[facility] - [place], [place]   Name: Tim Miranda DOB: [birth year] MRN: [id number] CSN: [id number] PCP: Derinda Late, MD  Arrival date and time:  11/25/21 [phone]  Chief Complaint:  Headache and Eye Problem   NOTE: Prior to seeing the patient today, I have reviewed the triage nursing documentation and vital signs. Clinical staff has updated patient's PMH/PSHx, current medication list, and drug allergies/intolerances to ensure comprehensive history available to assist in medical decision making.   History:   HPI: Tim Miranda is a 76 y.o. male who presents today with complaints of double vision, headaches and dizziness.  Patient is here with his wife and wife verifies that he has had congestion for 1 to 2 weeks.  No over-the-counter medications have been tried to help with his symptoms.  Of note, patient has a known history of strokes and sinus infection that required a sinus CT <1 month ago.  He denies any symptoms comparable to his previous stroke which include confusion, slurred speech.  This is verified by his wife as well.  He also has a known history of high blood pressure and is hypertensive today.  He states he did not take his blood pressure medication this morning.  He does not check his blood pressure at home so he is unaware if his blood pressure has been running higher recently.   Past Medical History:  Diagnosis Date   Actinic keratosis    Arthritis    Coronary artery disease    Degenerative disc disease, lumbar    Depression    Diabetes mellitus without complication ([place])    Dysplastic nevus 10/27/2018   Right low back 4.0cm lat to spine. Mild atypia, limited margins free.   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 to spine above waistline. Moderate atypia, limited margins free.    GERD (gastroesophageal reflux disease)    Gout    Hx of CABG    Hypercholesteremia    Hypertension    PLMD (periodic limb movement disorder)    Sleep apnea     Stroke [facility])    Thyroid disease     Past Surgical History:  Procedure Laterality Date   BACK SURGERY     CARPAL TUNNEL RELEASE     COLONOSCOPY WITH PROPOFOL N/A 09/19/2016   Procedure: COLONOSCOPY WITH PROPOFOL;  Surgeon: Manya Silvas, MD;  Location: [facility] ENDOSCOPY;  Service: Endoscopy;  Laterality: N/A;   CORONARY ARTERY BYPASS GRAFT     ESOPHAGOGASTRODUODENOSCOPY (EGD) WITH PROPOFOL N/A 09/19/2016   Procedure: ESOPHAGOGASTRODUODENOSCOPY (EGD) WITH PROPOFOL;  Surgeon: Manya Silvas, MD;  Location[facility] ENDOSCOPY;  Service: Endoscopy;  Laterality: N/A;   EYE SURGERY     SHOULDER SURGERY      Family History  Problem Relation Age of Onset   Heart disease Mother    Heart disease Father     Social History   Tobacco Use   Smoking status: Former    Packs/day: 0.25    Years: 3.00    Total pack years: 0.75    Types: Cigarettes   Smokeless tobacco: Never  Vaping Use   Vaping Use: Never used  Substance Use Topics   Alcohol use: No   Drug use: No    Patient Active Problem List   Diagnosis Date Noted   Carotid stenosis 10/15/2020   Anxiety 10/13/2020   Cataract 10/13/2020   Depression 10/13/2020   Dry eye syndrome 10/13/2020   Dry eyes 10/13/2020   Encounter  for fitting and adjustment of hearing aid 10/13/2020   Encounter for issue of repeat prescription 10/13/2020   Encounter for screening for eye and ear disorders 10/13/2020   Ex-smoker 10/13/2020   Hypertensive retinopathy 10/13/2020   Obstructive sleep apnea (adult) (pediatric) 10/13/2020   Presbyopia 10/13/2020   Senile nuclear sclerosis 10/13/2020   Sensorineural hearing loss, 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   CVA (cerebral vascular accident) ([place]) 09/27/2020   Stroke ([place]) 09/26/2020   Essential hypertension 09/26/2020   Hypothyroidism 09/26/2020   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/26/2020   Hyperlipidemia 09/26/2020   Gout 09/26/2020   Lumbar facet arthropathy 04/30/2019   History of lumbar  fusion (L3-L4 TLIF Nov 2017) 12/18/2018   Chronic radicular lumbar pain (R L3/4) 12/18/2018   Lumbar radiculopathy 12/18/2018   Spinal stenosis, lumbar region, 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   Lumbar degenerative disc disease 12/18/2018   Chronic pain syndrome 12/18/2018   Spinal stenosis of lumbosacral region 12/18/2018   Chronic diastolic CHF (congestive heart failure) ([place]) 12/01/2018   Hospital discharge follow-up 12/01/2018   Hard of hearing 11/27/2018   Chest pain 11/21/2018   Atherosclerotic heart disease of native coronary artery without angina pectoris 02/06/2016   GERD (gastroesophageal reflux disease) 02/06/2016   Diabetic nephropathy associated with type 2 diabetes mellitus ([place]) 06/01/2014   Cataracts, bilateral 47/42/5956   Metabolic syndrome 38/75/6433   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    Allergies:   Atorvastatin, Isosorbide, Lisinopril, and Metformin and related  Review of Systems (ROS): Review of Systems  Constitutional:  Negative for activity change, appetite change, fatigue and fever.  HENT:  Positive for congestion. Negative for sinus pressure, sinus pain and tinnitus.   Eyes:  Positive for visual disturbance. Negative for photophobia.  Respiratory:  Negative for cough.   Cardiovascular:  Negative for chest pain and leg swelling.  Gastrointestinal:  Negative for nausea and vomiting.  Musculoskeletal:  Negative for gait problem.  Skin:  Negative for color change.  Neurological:  Positive for dizziness and headaches. Negative for syncope, facial asymmetry, speech difficulty, weakness and light-headedness.     Vital Signs: Today's Vitals   11/25/21 0947 11/25/21 0948 11/25/21 0949 11/25/21 1000  BP:   (!) 183/84   Pulse:   (!) 55   Resp:   15   Temp:   98.1 F  (36.7 C)   TempSrc:   Temporal   SpO2:   98%   Weight:  225 lb (102.1 kg)    Height:  '5\' 9"'$  (1.753 m)    PainSc: 9    9     Physical Exam: Physical Exam Vitals and nursing note reviewed.  Constitutional:      Appearance: Normal appearance.  HENT:     Right Ear: Tympanic membrane normal. No middle ear effusion.     Left Ear: Tympanic membrane normal.  No middle ear effusion.     Mouth/Throat:     Pharynx: Oropharynx is clear. No posterior oropharyngeal erythema.  Eyes:     Extraocular Movements:     Right eye: Nystagmus present.     Left eye: Nystagmus present.     Conjunctiva/sclera: Conjunctivae normal.     Pupils: Pupils are equal, round, and reactive to light.     Comments: Slight nystagmus when gazing to right.  Cardiovascular:  Rate and Rhythm: Normal rate and regular rhythm.     Pulses: Normal pulses.     Heart sounds: Normal heart sounds.  Pulmonary:     Effort: Pulmonary effort is normal.     Breath sounds: Normal breath sounds.  Skin:    General: Skin is warm and dry.  Neurological:     General: No focal deficit present.     Mental Status: He is alert and oriented to person, place, and time.     Cranial Nerves: Cranial nerves 2-12 are intact.     Motor: No weakness.     Coordination: Coordination is intact.     Gait: Gait is intact.  Psychiatric:        Mood and Affect: Mood normal.        Behavior: Behavior normal.      Urgent Care Treatments / Results:   LABS: PLEASE NOTE: all labs that were ordered this encounter are listed, however only abnormal results are displayed. Labs Reviewed - No data to display  EKG: -None  RADIOLOGY: No results found.  PROCEDURES: Procedures  MEDICATIONS RECEIVED THIS VISIT: Medications - No data to display  PERTINENT CLINICAL COURSE NOTES/UPDATES:   Initial Impression / Assessment and Plan / Urgent Care Course:  Pertinent labs & imaging results that were available during my care of the patient were  personally reviewed by me and considered in my medical decision making (see lab/imaging section of note for values and interpretations).  KHAYDEN HERZBERG is a 76 y.o. male who presents to [facility] today with complaints of dizziness and blurred vision.  Concern regarding his blood pressure reading and his previous history of stroke combined with his presentation today.  Though there is a chance that this may be due to sinus inflammation, I do not feel comfortable progressing without ruling out a stroke with further imaging and assessment.  I strongly encouraged the patient and his wife to proceed to the local emergency department for further evaluation and they agreed.  Due to patient's stable presentation and the length of his symptoms (out of tPA window), transporting by private vehicle is appropriate.   Final Clinical Impressions / Urgent Care Diagnoses:   Final diagnoses:  Dizziness  New onset of headaches    New Prescriptions:  [place] Controlled Substance Registry consulted? Not Applicable  No orders of the defined types were placed in this encounter.     Discharge Instructions      Transfer to ED.     Recommended Follow up Care:  Patient encouraged to follow up with the following provider within the specified time frame, or sooner as dictated by the severity of his symptoms. As always, he was instructed that for any urgent/emergent care needs, he should seek care either here or in the emergency department for more immediate evaluation.   Gertie Baron, DNP, NP-c   Gertie Baron, NP 11/25/21 1016

## 2021-11-25 NOTE — ED Triage Notes (Signed)
Patient c/o head congestion for 1-2 weeks.  Patient states that yesterday afternoon he had double vision and dizziness.  Patient c/o headaches off and on for 3 weeks.  Patient reports history of 2 strokes.  Patient states that his last stroke was a year ago.

## 2021-11-25 NOTE — ED Triage Notes (Signed)
Pt states he started with double vision yesterday around 1630- he has been having headaches off and on for the last week and a half and has had some dizziness- pt does have a hx of strokes, pt states one last year

## 2021-11-25 NOTE — ED Provider Notes (Signed)
[facility] Provider Note    Event Date/Time   First MD Initiated Contact with Patient 11/25/21 1122     (approximate)   History   Diplopia   HPI  Tim Miranda is a 76 y.o. male who reports onset of double vision at 19 or 68 yesterday afternoon.  He has no other complaints of blurry vision or slurry speech or weakness or ataxia.  He has a history of a previous stroke history of diabetes history of hypertension see below.       DM: He is taking onglyza, glipizide, and insulin and his A1C was 7.4 on recent labs.  HTN: He is taking carvedilol and losartan and his blood pressure has been at goal.  Hypothyroid: He is taking levothyroxine and his TSH was within the normal range.  Hyperlipidemia: He is taking pravastatin and he tolerates this well. His LDL was 36.  CHF: He is taking lasix and his fluid balance has been good.   Patient Active Problem List  Diagnosis   GERD (gastroesophageal reflux disease)   Coronary artery disease   Hypertension   Degenerative disc disease, lumbar   Hypothyroidism   Postsurgical aortocoronary bypass status   Hyperlipidemia   Obesity (BMI 30-39.9), unspecified   Diabetes mellitus, type 2 (CMS-HCC)   Allergic rhinitis   Sleep apnea   Hard of hearing   Chronic diastolic CHF (congestive heart failure) (CMS-HCC)   Hospital discharge follow-up   Chronic pain syndrome   Chronic radicular lumbar pain   History of lumbar fusion   CVA (cerebral vascular accident) (CMS-HCC)   Past Medical History:  Diagnosis Date   Allergic rhinitis   Allergic state   Arthritis   Benign essential tremor 01/19/2016   Cataract cortical, senile   Coronary artery disease  s/p CABG   Degenerative disc disease, lumbar 05/20/2013  s/p multiple ESI procedures.   Depression   Diabetes mellitus, type 2 (CMS-HCC)   GERD (gastroesophageal reflux disease)   Gout   Hyperlipidemia  w/ triglyceride predominance   Hypertension    Hypothyroidism 10/21/2012   Obesity (BMI 30-39.9), unspecified   Periodic limb movement disorder (PLMD) 2018  Noted on sleep study; there notes indicate may improve w/ CPAP therapy.   Sleep apnea  On CPAP starting in 2018; previously left untx'd. Stopped & returned machine 11/01/16.   Current Outpatient Medications:   acetaminophen (TYLENOL) 500 MG tablet, Take by mouth, Disp: , Rfl:   allopurinol (ZYLOPRIM) 300 MG tablet, Take 300 mg by mouth once daily., Disp: , Rfl:   aspirin 81 MG EC tablet, Take 81 mg by mouth 2 (two) times daily., Disp: , Rfl:   blood glucose diagnostic (ONETOUCH ULTRA TEST) test strip, Use 1 strip via meter daily as directed in a rotating pattern before a meal, 2 hours after a meal or bedtime., Disp: 300 each, Rfl: 3  blood glucose meter (ONETOUCH ULTRAMINI) kit, Use 1 meter daily as directed in a rotating pattern before a meal, 2 hours after a meal or bedtime., Disp: 1 each, Rfl: 0  carvedilol (COREG) 25 MG tablet, Take 12.5 mg by mouth 2 (two) times daily with meals., Disp: , Rfl:   cholecalciferol (VITAMIN D3) 2,000 unit capsule, Take 1,000 Units by mouth 2 (two) times daily , Disp: , Rfl:   cinnamon bark 500 mg capsule, Take 1,000 mg by mouth once daily, Disp: , Rfl:   citalopram (CELEXA) 20 MG tablet, Take 0.5 tablets (10 mg total) by mouth once  daily, Disp: 45 tablet, Rfl: 1  clopidogreL (PLAVIX) 75 mg tablet, Take 1 tablet (75 mg total) by mouth once daily, Disp: 90 tablet, Rfl: 3  coenzyme Q10 (CO Q-10) 10 mg capsule, Take 10 mg by mouth once daily, Disp: , Rfl:   cyanocobalamin (VITAMIN B12) 1000 MCG tablet, Take 1,000 mcg by mouth once daily, Disp: , Rfl:   DOCOSAHEXANOIC ACID/EPA (FISH OIL ORAL), Take 3 capsules by mouth once daily , Disp: , Rfl:   fluticasone (FLONASE) 50 mcg/actuation nasal spray, Place 2 sprays into both nostrils 2 (two) times daily , Disp: , Rfl:   FUROsemide (LASIX) 40 MG tablet, Take 20 mg by mouth once daily as needed for Edema, Disp: ,  Rfl:   gabapentin (NEURONTIN) 300 MG capsule, Take 300 mg by mouth 2 (two) times daily, Disp: , Rfl:   glipiZIDE (GLUCOTROL XL) 5 MG XL tablet, Take 1 tablet (5 mg total) by mouth 2 (two) times daily, Disp: 180 tablet, Rfl: 3  hydrocodone-chlorpheniramine (TUSSIONEX) 10-8 mg/5 mL ER suspension, Take 5 mLs by mouth every 12 (twelve) hours as needed, Disp: 100 mL, Rfl: 0  insulin GLARGINE (LANTUS) injection (concentration 100 units/mL), Inject 10 Units subcutaneously nightly, Disp: , Rfl:   lancets 30 gauge Misc, Use 1 each once daily. Test once a day in a rotating pattern throughout the week., Disp: 300 each, Rfl: 3  levothyroxine (SYNTHROID, LEVOTHROID) 50 MCG tablet, Take 50 mcg by mouth once daily Take on an empty stomach with a glass of water at least 30-60 minutes before breakfast., Disp: , Rfl:   lidocaine (XYLOCAINE) 2 % solution, Swish and spit 5 mLs 4 (four) times daily as needed for Pain for up to 10 days, Disp: 100 mL, Rfl: 0  losartan (COZAAR) 100 MG tablet, Take 0.5 tablets by mouth once daily, Disp: , Rfl:   methyl salicylate-menthol 22-97 % cream, APPLY SMALL AMOUNT TOPICALLY FOUR TIMES A DAY AS NEEDED FOR PAIN ( AVOID FACE & EYES, WASH HANDS AFTER USING). FOR JOINT PAIN, Disp: , Rfl:   montelukast (SINGULAIR) 10 mg tablet, Take 10 mg by mouth nightly., Disp: , Rfl:   multivitamin tablet, Take 1 tablet by mouth once daily, Disp: , Rfl:   pen needle, diabetic 31 gauge x 3/16" needle, Use as directed, Disp: 100 each, Rfl: 12  pravastatin (PRAVACHOL) 80 MG tablet, Take 80 mg by mouth nightly., Disp: , Rfl:   sAXagliptin (ONGLYZA) 5 mg tablet, Take 1 tablet (5 mg total) by mouth once daily, Disp: 90 tablet, Rfl: 1  UNABLE TO FIND, 250 mg 2 (two) times daily Beta-Prostate, Disp: , Rfl:   Allergies  Allergen Reactions   Isosorbide Diarrhea and Nausea And Vomiting   Atorvastatin Muscle Pain   Simvastatin Muscle Pain   Lisinopril Cough   Metformin Diarrhea    Physical Exam   Triage  Vital Signs: ED Triage Vitals  Enc Vitals Group     BP 11/25/21 1113 (!) 182/77     Pulse Rate 11/25/21 1113 (!) 58     Resp 11/25/21 1113 18     Temp 11/25/21 1113 97.6 F (36.4 C)     Temp Source 11/25/21 1113 Oral     SpO2 11/25/21 1113 97 %     Weight 11/25/21 1114 225 lb (102.1 kg)     Height 11/25/21 1114 5' 9"  (1.753 m)     Head Circumference --      Peak Flow --      Pain Score 11/25/21  1113 8     Pain Loc --      Pain Edu? --      Excl. in [place]? --     Most recent vital signs: Vitals:   11/25/21 1230 11/25/21 1550  BP: (!) 165/75 (!) 184/81  Pulse: (!) 57 (!) 59  Resp: 17 19  Temp:    SpO2: 99% 99%     General: Awake, no distress.  Head normocephalic atraumatic Eyes pupils equal round extraocular movements are intact I do not see any deviation between 1 pupil and the other. CV:  Good peripheral perfusion.  Heart regular rate and rhythm no audible murmur Resp:  Normal effort.  Lungs are clear Abd:  No distention.  Soft and nontender Neuro exam cranial nerves II through XII are intact to observation visual fields were not checked cerebellar finger-nose rapid alternating movements and hands are normal heel-to-shin is normal the patient complains of stiffness due to rods in his back.  Motor strength is 5/5 throughout    ED Results / Procedures / Treatments   Labs (all labs ordered are listed, but only abnormal results are displayed) Labs Reviewed  COMPREHENSIVE METABOLIC PANEL - Abnormal; Notable for the following components:      Result Value   Glucose, Bld 209 (*)    All other components within normal limits  PROTIME-INR  APTT  CBC  DIFFERENTIAL  ETHANOL  CBG MONITORING, ED     EKG  EKG read and interpreted by me shows normal sinus rhythm rate of 60 left axis right bundle branch block computer also reads left anterior hemiblock no obvious acute ST-T changes very irregular baseline   RADIOLOGY CT head read by radiology reviewed and interpreted by me  shows parietal infarct which is old no obvious acute changes are seen   PROCEDURES:  Critical Care performed:   Procedures   MEDICATIONS ORDERED IN ED: Medications - No data to display   IMPRESSION / MDM / [place] / ED COURSE  I reviewed the triage vital signs and the nursing notes. ----------------------------------------- 12:20 PM on 11/25/2021 ----------------------------------------- CT is negative for acute stroke as discussed previously with patient will get MRI.  Differential diagnosis includes, but is not limited to, stroke or TIA.  These are unlikely because his symptoms have not resolved and the MRI is negative.  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  This makes sense.  The patient's double vision is worse when he gazes to the left.  Neurology feels the patient should follow-up with Optho I discussed patient with Dr. Edison Pace who is on-call for ophthalmology is more than happy to see the patient in follow-up.  Patient will return for any new symptoms. Patient's presentation is most consistent with acute complicated illness / injury requiring diagnostic workup.  The patient is on the cardiac monitor to evaluate for evidence of arrhythmia and/or significant heart rate changes.  None were seen.   FINAL CLINICAL IMPRESSION(S) / ED DIAGNOSES   Final diagnoses:  Diplopia     Rx / DC Orders   ED Discharge Orders     None        Note:  This document was prepared using Dragon voice recognition software and may include unintentional dictation errors.   Nena Polio, MD 11/25/21 [phone]

## 2021-11-25 NOTE — Discharge Instructions (Addendum)
Your MRI did not show any new strokes.  I talked to neurology about you and the neurologist thinks that you were having a complication of diabetes affecting one of the muscles that controls the movement of your eye.  This often causes double vision.  He wants me to have you follow-up with ophthalmology.  Dr. Edison Pace is on-call.  I spoke to him.  He said he could see you in the [place] office at 9 AM on Tuesday.  Please do not hesitate to return here if any new symptoms develop.  Please continue all your medications.

## 2021-11-25 NOTE — ED Notes (Signed)
Patient is being discharged from the Urgent Care and sent to the [facility] Emergency Department via private vehicle with his spouse . Per Gertie Baron, NP, patient is in need of higher level of care due to severe headache, elevated blood pressure and visual changes. Patient is aware and verbalizes understanding of plan of care.  Vitals:   11/25/21 0949  BP: (!) 183/84  Pulse: (!) 55  Resp: 15  Temp: 98.1 F (36.7 C)  SpO2: 98%

## 2021-11-25 NOTE — Discharge Instructions (Addendum)
Transfer to ED

## 2021-11-28 ENCOUNTER — Other Ambulatory Visit
Admission: RE | Admit: 2021-11-28 | Discharge: 2021-11-28 | Disposition: A | Discharge status: Home / Self Care | Payer: Medicare Other | Source: Ambulatory Visit | Attending: Ophthalmology | Admitting: Ophthalmology

## 2021-11-28 DIAGNOSIS — M316 Other giant cell arteritis: Secondary | ICD-10-CM | POA: Diagnosis present

## 2021-11-28 LAB — C-REACTIVE PROTEIN: CRP: 1.3 mg/dL — ABNORMAL HIGH (ref <1.0)

## 2021-11-28 LAB — SEDIMENTATION RATE: Sed Rate: 10 mm/hr (ref 0–20)

## 2021-11-28 LAB — PLATELET COUNT: Platelets: 288 10*3/uL (ref 150–400)

## 2021-11-30 ENCOUNTER — Ambulatory Visit (INDEPENDENT_AMBULATORY_CARE_PROVIDER_SITE_OTHER): Payer: Medicare Other | Admitting: Dermatology

## 2021-11-30 DIAGNOSIS — L814 Other melanin hyperpigmentation: Secondary | ICD-10-CM

## 2021-11-30 DIAGNOSIS — L820 Inflamed seborrheic keratosis: Secondary | ICD-10-CM

## 2021-11-30 DIAGNOSIS — Z86018 Personal history of other benign neoplasm: Secondary | ICD-10-CM

## 2021-11-30 DIAGNOSIS — Z1283 Encounter for screening for malignant neoplasm of skin: Secondary | ICD-10-CM | POA: Diagnosis not present

## 2021-11-30 DIAGNOSIS — D3617 Benign neoplasm of peripheral nerves and autonomic nervous system of trunk, unspecified: Secondary | ICD-10-CM

## 2021-11-30 DIAGNOSIS — D1801 Hemangioma of skin and subcutaneous tissue: Secondary | ICD-10-CM

## 2021-11-30 DIAGNOSIS — L578 Other skin changes due to chronic exposure to nonionizing radiation: Secondary | ICD-10-CM

## 2021-11-30 DIAGNOSIS — L821 Other seborrheic keratosis: Secondary | ICD-10-CM | POA: Diagnosis not present

## 2021-11-30 DIAGNOSIS — D229 Melanocytic nevi, unspecified: Secondary | ICD-10-CM

## 2021-11-30 DIAGNOSIS — D239 Other benign neoplasm of skin, unspecified: Secondary | ICD-10-CM

## 2021-11-30 DIAGNOSIS — D489 Neoplasm of uncertain behavior, unspecified: Secondary | ICD-10-CM

## 2021-11-30 NOTE — Patient Instructions (Addendum)
   Biopsy Wound Care Instructions  Leave the original bandage on for 24 hours if possible.  If the bandage becomes soaked or soiled before that time, it is OK to remove it and examine the wound.  A small amount of post-operative bleeding is normal.  If excessive bleeding occurs, remove the bandage, place gauze over the site and apply continuous pressure (no peeking) over the area for 30 minutes. If this does not work, please call our clinic as soon as possible or page your doctor if it is after hours.   Once a day, cleanse the wound with soap and water. It is fine to shower. If a thick crust develops you may use a Q-tip dipped into dilute hydrogen peroxide (mix 1:1 with water) to dissolve it.  Hydrogen peroxide can slow the healing process, so use it only as needed.    After washing, apply petroleum jelly (Vaseline) or an antibiotic ointment if your doctor prescribed one for you, followed by a bandage.    For best healing, the wound should be covered with a layer of ointment at all times. If you are not able to keep the area covered with a bandage to hold the ointment in place, this may mean re-applying the ointment several times a day.  Continue this wound care until the wound has healed and is no longer open.   Itching and mild discomfort is normal during the healing process. However, if you develop pain or severe itching, please call our office.   If you have any discomfort, you can take Tylenol (acetaminophen) or ibuprofen as directed on the bottle. (Please do not take these if you have an allergy to them or cannot take them for another reason).  Some redness, tenderness and white or yellow material in the wound is normal healing.  If the area becomes very sore and red, or develops a thick yellow-green material (pus), it may be infected; please notify us.    If you have stitches, return to clinic as directed to have the stitches removed. You will continue wound care for 2-3 days after the  stitches are removed.   Wound healing continues for up to one year following surgery. It is not unusual to experience pain in the scar from time to time during the interval.  If the pain becomes severe or the scar thickens, you should notify the office.    A slight amount of redness in a scar is expected for the first six months.  After six months, the redness will fade and the scar will soften and fade.  The color difference becomes less noticeable with time.  If there are any problems, return for a post-op surgery check at your earliest convenience.  To improve the appearance of the scar, you can use silicone scar gel, cream, or sheets (such as Mederma or Serica) every night for up to one year. These are available over the counter (without a prescription).  Please call our office at [phone] for any questions or concerns.  Seborrheic Keratosis  What causes seborrheic keratoses? Seborrheic keratoses are harmless, common skin growths that first appear during adult life.  As time goes by, more growths appear.  Some people may develop a large number of them.  Seborrheic keratoses appear on both covered and uncovered body parts.  They are not caused by sunlight.  The tendency to develop seborrheic keratoses can be inherited.  They vary in color from skin-colored to gray, brown, or even black.  They   can be either smooth or have a rough, warty surface.   Seborrheic keratoses are superficial and look as if they were stuck on the skin.  Under the microscope this type of keratosis looks like layers upon layers of skin.  That is why at times the top layer may seem to fall off, but the rest of the growth remains and re-grows.    Treatment Seborrheic keratoses do not need to be treated, but can easily be removed in the office.  Seborrheic keratoses often cause symptoms when they rub on clothing or jewelry.  Lesions can be in the way of shaving.  If they become inflamed, they can cause itching, soreness,  or burning.  Removal of a seborrheic keratosis can be accomplished by freezing, burning, or surgery. If any spot bleeds, scabs, or grows rapidly, please return to have it checked, as these can be an indication of a skin cancer.    Cryotherapy Aftercare  Wash gently with soap and water everyday.   Apply Vaseline and Band-Aid daily until healed.     Actinic keratoses are precancerous spots that appear secondary to cumulative UV radiation exposure/sun exposure over time. They are chronic with expected duration over 1 year. A portion of actinic keratoses will progress to squamous cell carcinoma of the skin. It is not possible to reliably predict which spots will progress to skin cancer and so treatment is recommended to prevent development of skin cancer.  Recommend daily broad spectrum sunscreen SPF 30+ to sun-exposed areas, reapply every 2 hours as needed.  Recommend staying in the shade or wearing [place] sleeves, sun glasses (UVA+UVB protection) and wide brim hats (4-inch brim around the entire circumference of the hat). Call for new or changing lesions.      Melanoma ABCDEs  Melanoma is the most dangerous type of skin cancer, and is the leading cause of death from skin disease.  You are more likely to develop melanoma if you: Have light-colored skin, light-colored eyes, or red or blond hair Spend a lot of time in the sun Tan regularly, either outdoors or in a tanning bed Have had blistering sunburns, especially during childhood Have a close family member who has had a melanoma Have atypical moles or large birthmarks  Early detection of melanoma is key since treatment is typically straightforward and cure rates are extremely high if we catch it early.   The first sign of melanoma is often a change in a mole or a new dark spot.  The ABCDE system is a way of remembering the signs of melanoma.  A for asymmetry:  The two halves do not match. B for border:  The edges of the growth are  irregular. C for color:  A mixture of colors are present instead of an even brown color. D for diameter:  Melanomas are usually (but not always) greater than 6mm - the size of a pencil eraser. E for evolution:  The spot keeps changing in size, shape, and color.  Please check your skin once per month between visits. You can use a small mirror in front and a large mirror behind you to keep an eye on the back side or your body.   If you see any new or changing lesions before your next follow-up, please call to schedule a visit.  Please continue daily skin protection including broad spectrum sunscreen SPF 30+ to sun-exposed areas, reapplying every 2 hours as needed when you're outdoors.   Staying in the shade or wearing [place] sleeves,   sun glasses (UVA+UVB protection) and wide brim hats (4-inch brim around the entire circumference of the hat) are also recommended for sun protection.    Due to recent changes in healthcare laws, you may see results of your pathology and/or laboratory studies on MyChart before the doctors have had a chance to review them. We understand that in some cases there may be results that are confusing or concerning to you. Please understand that not all results are received at the same time and often the doctors may need to interpret multiple results in order to provide you with the best plan of care or course of treatment. Therefore, we ask that you please give us 2 business days to thoroughly review all your results before contacting the office for clarification. Should we see a critical lab result, you will be contacted sooner.   If You Need Anything After Your Visit  If you have any questions or concerns for your doctor, please call our main line at [phone] and press option 4 to reach your doctor's medical assistant. If no one answers, please leave a voicemail as directed and we will return your call as soon as possible. Messages left after 4 pm will be answered the  following business day.   You may also send us a message via MyChart. We typically respond to MyChart messages within 1-2 business days.  For prescription refills, please ask your pharmacy to contact our office. Our fax number is [phone].  If you have an urgent issue when the clinic is closed that cannot wait until the next business day, you can page your doctor at the number below.    Please note that while we do our best to be available for urgent issues outside of office hours, we are not available 24/7.   If you have an urgent issue and are unable to reach us, you may choose to seek medical care at your doctor's office, retail clinic, urgent care center, or emergency room.  If you have a medical emergency, please immediately call 911 or go to the emergency department.  Pager Numbers  - Dr. Kowalski: [phone]  - Dr. Moye: [phone]  - Dr. Stewart: [phone]  In the event of inclement weather, please call our main line at [phone] for an update on the status of any delays or closures.  Dermatology Medication Tips: Please keep the boxes that topical medications come in in order to help keep track of the instructions about where and how to use these. Pharmacies typically print the medication instructions only on the boxes and not directly on the medication tubes.   If your medication is too expensive, please contact our office at [phone] option 4 or send us a message through MyChart.   We are unable to tell what your co-pay for medications will be in advance as this is different depending on your insurance coverage. However, we may be able to find a substitute medication at lower cost or fill out paperwork to get insurance to cover a needed medication.   If a prior authorization is required to get your medication covered by your insurance company, please allow us 1-2 business days to complete this process.  Drug prices often vary depending on where the  prescription is filled and some pharmacies may offer cheaper prices.  The website www.goodrx.com contains coupons for medications through different pharmacies. The prices here do not account for what the cost may be with help from insurance (it may be cheaper with your insurance),   but the website can give you the price if you did not use any insurance.  - You can print the associated coupon and take it with your prescription to the pharmacy.  - You may also stop by our office during regular business hours and pick up a GoodRx coupon card.  - If you need your prescription sent electronically to a different pharmacy, notify our office through [facility] MyChart or by phone at [phone] option 4.     Si Usted Necesita Algo Despus de Su Visita  Tambin puede enviarnos un mensaje a travs de MyChart. Por lo general respondemos a los mensajes de MyChart en el transcurso de 1 a 2 das hbiles.  Para renovar recetas, por favor pida a su farmacia que se ponga en contacto con nuestra oficina. Nuestro nmero de fax es el [phone].  Si tiene un asunto urgente cuando la clnica est cerrada y que no puede esperar hasta el siguiente da hbil, puede llamar/localizar a su doctor(a) al nmero que aparece a continuacin.   Por favor, tenga en cuenta que aunque hacemos todo lo posible para estar disponibles para asuntos urgentes fuera del horario de oficina, no estamos disponibles las 24 horas del da, los 7 das de la semana.   Si tiene un problema urgente y no puede comunicarse con nosotros, puede optar por buscar atencin mdica  en el consultorio de su doctor(a), en una clnica privada, en un centro de atencin urgente o en una sala de emergencias.  Si tiene una emergencia mdica, por favor llame inmediatamente al 911 o vaya a la sala de emergencias.  Nmeros de bper  - Dr. Kowalski: [phone]  - Dra. Moye: [phone]  - Dra. Stewart: [phone]  En caso de inclemencias del tiempo,  por favor llame a nuestra lnea principal al [phone] para una actualizacin sobre el estado de cualquier retraso o cierre.  Consejos para la medicacin en dermatologa: Por favor, guarde las cajas en las que vienen los medicamentos de uso tpico para ayudarle a seguir las instrucciones sobre dnde y cmo usarlos. Las farmacias generalmente imprimen las instrucciones del medicamento slo en las cajas y no directamente en los tubos del medicamento.   Si su medicamento es muy caro, por favor, pngase en contacto con nuestra oficina llamando al [phone] y presione la opcin 4 o envenos un mensaje a travs de MyChart.   No podemos decirle cul ser su copago por los medicamentos por adelantado ya que esto es diferente dependiendo de la cobertura de su seguro. Sin embargo, es posible que podamos encontrar un medicamento sustituto a menor costo o llenar un formulario para que el seguro cubra el medicamento que se considera necesario.   Si se requiere una autorizacin previa para que su compaa de seguros cubra su medicamento, por favor permtanos de 1 a 2 das hbiles para completar este proceso.  Los precios de los medicamentos varan con frecuencia dependiendo del lugar de dnde se surte la receta y alguna farmacias pueden ofrecer precios ms baratos.  El sitio web www.goodrx.com tiene cupones para medicamentos de diferentes farmacias. Los precios aqu no tienen en cuenta lo que podra costar con la ayuda del seguro (puede ser ms barato con su seguro), pero el sitio web puede darle el precio si no utiliz ningn seguro.  - Puede imprimir el cupn correspondiente y llevarlo con su receta a la farmacia.  - Tambin puede pasar por nuestra oficina durante el horario de atencin regular y recoger una tarjeta de cupones de   GoodRx.  - Si necesita que su receta se enve electrnicamente a una farmacia diferente, informe a nuestra oficina a travs de MyChart de  o por telfono llamando al  [phone] y presione la opcin 4.  

## 2021-11-30 NOTE — Progress Notes (Signed)
Follow-Up Visit   Subjective  Tim Miranda is a 76 y.o. male who presents for the following: Annual Exam (1 yr follow up, hx of isks, hx of aks, hx of dysplastic, Reports some spots on left side neck, b/l lower legs, back, and left chest he would like checked. ). The patient presents for Upper Body Skin Exam (UBSE) for skin cancer screening and mole check.  The patient has spots, moles and lesions to be evaluated, some may be new or changing and the patient has concerns that these could be cancer.  The following portions of the chart were reviewed this encounter and updated as appropriate:  Tobacco  Allergies  Meds  Problems  Med Hx  Surg Hx  Fam Hx     Review of Systems: No other skin or systemic complaints except as noted in HPI or Assessment and Plan.  Objective  Well appearing patient in no apparent distress; mood and affect are within normal limits.  All skin waist up examined. And legs   left neck x 1, right lateral ankle x 1, right side x 1, left calf x 1 (4) Erythematous stuck-on, waxy papule or plaque  left superior pectoral 0.7 cm flesh papule       Assessment & Plan  Inflamed seborrheic keratosis (4) left neck x 1, right lateral ankle x 1, right side x 1, left calf x 1 Symptomatic, irritating, patient would like treated. Destruction of lesion - left neck x 1, right lateral ankle x 1, right side x 1, left calf x 1 Complexity: extensive   Destruction method: electrodesiccation and curettage   Informed consent: discussed and consent obtained   Timeout:  patient name, date of birth, surgical site, and procedure verified Procedure prep:  Patient was prepped and draped in usual sterile fashion Prep type:  Isopropyl alcohol Anesthesia: the lesion was anesthetized in a standard fashion   Anesthetic:  1% lidocaine w/ epinephrine 1-100,000 buffered w/ 8.4% NaHCO3 Curettage performed in three different directions: Yes   Electrodesiccation performed over the  curetted area: Yes   Hemostasis achieved with:  pressure, aluminum chloride and electrodesiccation Outcome: patient tolerated procedure well with no complications   Post-procedure details: sterile dressing applied and wound care instructions given   Dressing type: bandage and petrolatum    Neoplasm of uncertain behavior left superior pectoral Epidermal / dermal shaving  Lesion diameter (cm):  0.7 Informed consent: discussed and consent obtained   Timeout: patient name, date of birth, surgical site, and procedure verified   Procedure prep:  Patient was prepped and draped in usual sterile fashion Prep type:  Isopropyl alcohol Anesthesia: the lesion was anesthetized in a standard fashion   Anesthetic:  1% lidocaine w/ epinephrine 1-100,000 buffered w/ 8.4% NaHCO3 Instrument used: flexible razor blade   Hemostasis achieved with: pressure, aluminum chloride and electrodesiccation   Outcome: patient tolerated procedure well   Post-procedure details: sterile dressing applied and wound care instructions given   Dressing type: bandage and petrolatum    Specimen 1 - Surgical pathology Differential Diagnosis: Irritated nevus r/o dysplasia  Check Margins: No Irritated nevus r/o dysplasia   Lentigines - Scattered tan macules - Due to sun exposure - Benign-appearing, observe - Recommend daily broad spectrum sunscreen SPF 30+ to sun-exposed areas, reapply every 2 hours as needed. - Call for any changes  Seborrheic Keratoses - Stuck-on, waxy, tan-brown papules and/or plaques  - Benign-appearing - Discussed benign etiology and prognosis. - Observe - Call for any changes  Melanocytic Nevi - Tan-brown and/or pink-flesh-colored symmetric macules and papules - Benign appearing on exam today - Observation - Call clinic for new or changing moles - Recommend daily use of broad spectrum spf 30+ sunscreen to sun-exposed areas.   Hemangiomas - Red papules - Discussed benign nature -  Observe - Call for any changes  Actinic Damage - Chronic condition, secondary to cumulative UV/sun exposure - diffuse scaly erythematous macules with underlying dyspigmentation - Recommend daily broad spectrum sunscreen SPF 30+ to sun-exposed areas, reapply every 2 hours as needed.  - Staying in the shade or wearing [place] sleeves, sun glasses (UVA+UVB protection) and wide brim hats (4-inch brim around the entire circumference of the hat) are also recommended for sun protection.  - Call for new or changing lesions.  History of Dysplastic Nevi Multiple locations see history  - No evidence of recurrence today - Recommend regular full body skin exams - Recommend daily broad spectrum sunscreen SPF 30+ to sun-exposed areas, reapply every 2 hours as needed.  - Call if any new or changing lesions are noted between office visits  Skin cancer screening performed today. Return in about 1 year (around 12/01/2022) for TBSE. IRuthell Rummage, CMA, am acting as scribe for Sarina Ser, MD. Documentation: I have reviewed the above documentation for accuracy and completeness, and I agree with the above.  Sarina Ser, MD

## 2021-12-05 ENCOUNTER — Telehealth: Payer: Self-pay

## 2021-12-05 ENCOUNTER — Encounter: Payer: Self-pay | Admitting: Dermatology

## 2021-12-05 NOTE — Telephone Encounter (Signed)
-----   Message from Ralene Bathe, MD sent at 12/04/2021  6:00 PM EDT ----- Diagnosis Skin , left superior pectoral NEUROFIBROMA, IRRITATED  Benign "mole" No further treatment needed

## 2021-12-05 NOTE — Telephone Encounter (Signed)
Patient's wife informed of pathology results.

## 2022-01-01 ENCOUNTER — Ambulatory Visit (INDEPENDENT_AMBULATORY_CARE_PROVIDER_SITE_OTHER): Payer: Medicare Other

## 2022-01-01 ENCOUNTER — Ambulatory Visit (INDEPENDENT_AMBULATORY_CARE_PROVIDER_SITE_OTHER): Payer: Medicare Other | Admitting: Vascular Surgery

## 2022-01-01 ENCOUNTER — Encounter (INDEPENDENT_AMBULATORY_CARE_PROVIDER_SITE_OTHER): Payer: Self-pay | Admitting: Vascular Surgery

## 2022-01-01 VITALS — BP 126/68 | HR 62 | Resp 16 | Wt 227.8 lb

## 2022-01-01 DIAGNOSIS — E119 Type 2 diabetes mellitus without complications: Secondary | ICD-10-CM

## 2022-01-01 DIAGNOSIS — I10 Essential (primary) hypertension: Secondary | ICD-10-CM | POA: Diagnosis not present

## 2022-01-01 DIAGNOSIS — I6529 Occlusion and stenosis of unspecified carotid artery: Secondary | ICD-10-CM | POA: Diagnosis not present

## 2022-01-01 DIAGNOSIS — I2510 Atherosclerotic heart disease of native coronary artery without angina pectoris: Secondary | ICD-10-CM | POA: Diagnosis not present

## 2022-01-01 DIAGNOSIS — E782 Mixed hyperlipidemia: Secondary | ICD-10-CM

## 2022-01-01 DIAGNOSIS — Z794 Long term (current) use of insulin: Secondary | ICD-10-CM

## 2022-01-01 NOTE — Progress Notes (Signed)
MRN : [id number]  Tim Miranda is a 76 y.o. (01-15-1946) male who presents with chief complaint of check carotid arteries.  History of Present Illness:   The patient is seen for follow up evaluation of carotid stenosis. The carotid stenosis was identified by CT angiogram during an ER visit which was a work up for a CVA.    The patient denies amaurosis fugax. There is no recent history of TIA symptoms or focal motor deficits. 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   The patient is taking enteric-coated aspirin 81 mg daily.   There is no history of migraine headaches. There is no history of seizures.   The patient has a history of coronary artery disease, no recent episodes of angina or shortness of breath. The patient denies PAD or claudication symptoms. There is a history of hyperlipidemia which is being treated with a statin.     Duplex ultrasound of the carotid arteries done today demonstrates 1 to 39% stenosis of bilateral internal carotid arteries.  No change compared to previous study.  No outpatient medications have been marked as taking for the 01/01/22 encounter (Appointment) with Delana Meyer, Dolores Lory, MD.    Past Medical History:  Diagnosis Date   Actinic keratosis    Arthritis    Coronary artery disease    Degenerative disc disease, lumbar    Depression    Diabetes mellitus without complication ([place])    Dysplastic nevus 10/27/2018   Right low back 4.0cm lat to spine. Mild atypia, limited margins free.   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 to spine above waistline. Moderate atypia, limited margins free.    GERD (gastroesophageal reflux disease)    Gout    Hx of CABG    Hypercholesteremia    Hypertension    PLMD (periodic limb movement disorder)    Sleep apnea    Stroke [facility])    Thyroid disease     Past Surgical History:  Procedure Laterality Date   BACK SURGERY     CARPAL TUNNEL RELEASE     COLONOSCOPY WITH PROPOFOL N/A 09/19/2016    Procedure: COLONOSCOPY WITH PROPOFOL;  Surgeon: Manya Silvas, MD;  Location: [facility] ENDOSCOPY;  Service: Endoscopy;  Laterality: N/A;   CORONARY ARTERY BYPASS GRAFT     ESOPHAGOGASTRODUODENOSCOPY (EGD) WITH PROPOFOL N/A 09/19/2016   Procedure: ESOPHAGOGASTRODUODENOSCOPY (EGD) WITH PROPOFOL;  Surgeon: Manya Silvas, MD;  Location: [facility] ENDOSCOPY;  Service: Endoscopy;  Laterality: N/A;   EYE SURGERY     SHOULDER SURGERY      Social History Social History   Tobacco Use   Smoking status: Former    Packs/day: 0.25    Years: 3.00    Total pack years: 0.75    Types: Cigarettes   Smokeless tobacco: Never  Vaping Use   Vaping Use: Never used  Substance Use Topics   Alcohol use: No   Drug use: No    Family History Family History  Problem Relation Age of Onset   Heart disease Mother    Heart disease Father     Allergies  Allergen Reactions   Atorvastatin Other (See Comments)   Isosorbide    Lisinopril    Metformin And Related      REVIEW OF SYSTEMS (Negative unless checked)  Constitutional: '[]'$ Weight loss  '[]'$ Fever  '[]'$ Chills Cardiac: '[]'$ Chest pain   '[]'$ Chest pressure   '[]'$ Palpitations   '[]'$ Shortness of breath when laying flat   '[]'$ Shortness of breath with  exertion. Vascular:  '[x]'$ Pain in legs with walking   '[]'$ Pain in legs at rest  '[]'$ History of DVT   '[]'$ Phlebitis   '[]'$ Swelling in legs   '[]'$ Varicose veins   '[]'$ Non-healing ulcers Pulmonary:   '[]'$ Uses home oxygen   '[]'$ Productive cough   '[]'$ Hemoptysis   '[]'$ Wheeze  '[]'$ COPD   '[]'$ Asthma Neurologic:  '[]'$ Dizziness   '[]'$ Seizures   '[]'$ History of stroke   '[]'$ History of TIA  '[]'$ Aphasia   '[]'$ Vissual changes   '[]'$ Weakness or numbness in arm   '[]'$ Weakness or numbness in leg Musculoskeletal:   '[]'$ Joint swelling   '[]'$ Joint pain   '[]'$ Low back pain Hematologic:  '[]'$ Easy bruising  '[]'$ Easy bleeding   '[]'$ Hypercoagulable state   '[]'$ Anemic Gastrointestinal:  '[]'$ Diarrhea   '[]'$ Vomiting  '[]'$ Gastroesophageal reflux/heartburn   '[]'$ Difficulty swallowing. Genitourinary:  '[]'$ Chronic kidney  disease   '[]'$ Difficult urination  '[]'$ Frequent urination   '[]'$ Blood in urine Skin:  '[]'$ Rashes   '[]'$ Ulcers  Psychological:  '[]'$ History of anxiety   '[]'$  History of major depression.  Physical Examination  There were no vitals filed for this visit. There is no height or weight on file to calculate BMI. Gen: WD/WN, NAD Head: [place]/AT, No temporalis wasting.  Ear/Nose/Throat: Hearing grossly intact, nares w/o erythema or drainage Eyes: PER, EOMI, sclera nonicteric.  Neck: Supple, no masses.  No bruit or JVD.  Pulmonary:  Good air movement, no audible wheezing, no use of accessory muscles.  Cardiac: RRR, normal S1, S2, no Murmurs. Vascular:   no carotid bruit noted Vessel Right Left  Radial Palpable Palpable  Carotid  Palpable  Palpable  Gastrointestinal: soft, non-distended. No guarding/no peritoneal signs.  Musculoskeletal: M/S 5/5 throughout.  No visible deformity.  Neurologic: CN 2-12 intact. Pain and light touch intact in extremities.  Symmetrical.  Speech is fluent. Motor exam as listed above. Psychiatric: Judgment intact, Mood & affect appropriate for pt's clinical situation. Dermatologic: No rashes or ulcers noted.  No changes consistent with cellulitis.   CBC Lab Results  Component Value Date   WBC 8.6 11/25/2021   HGB 13.7 11/25/2021   HCT 39.3 11/25/2021   MCV 91.6 11/25/2021   PLT 288 11/28/2021    BMET    Component Value Date/Time   NA 140 11/25/2021 1141   K 4.0 11/25/2021 1141   CL 105 11/25/2021 1141   CO2 28 11/25/2021 1141   GLUCOSE 209 (H) 11/25/2021 1141   BUN 19 11/25/2021 1141   CREATININE 1.10 11/25/2021 1141   CALCIUM 9.1 11/25/2021 1141   GFRNONAA >60 11/25/2021 1141   GFRAA >60 11/22/2018 0645   CrCl cannot be calculated (Patient's most recent lab result is older than the maximum 21 days allowed.).  COAG Lab Results  Component Value Date   INR 1.1 11/25/2021   INR 1.1 09/26/2020    Radiology No results found.   Assessment/Plan 1. Stenosis of  carotid artery, unspecified laterality Recommend:  Given the patient's asymptomatic subcritical stenosis no further invasive testing or surgery at this time.  Duplex ultrasound shows 1-39% stenosis bilaterally.  Continue antiplatelet therapy as prescribed Continue management of CAD, HTN and Hyperlipidemia Healthy heart diet,  encouraged exercise at least 4 times per week Follow up PRN  - VAS US CAROTID  2. Atherosclerosis of native coronary artery of native heart without angina pectoris Continue cardiac and antihypertensive medications as already ordered and reviewed, no changes at this time.  Continue statin as ordered and reviewed, no changes at this time  Nitrates PRN for chest pain   3. Essential hypertension Continue  antihypertensive medications as already ordered, these medications have been reviewed and there are no changes at this time.   4. Insulin dependent type 2 diabetes mellitus ([place]) Continue hypoglycemic medications as already ordered, these medications have been reviewed and there are no changes at this time.  Hgb A1C to be monitored as already arranged by primary service   5. Mixed hyperlipidemia Continue statin as ordered and reviewed, no changes at this time     Hortencia Pilar, MD  01/01/2022 8:37 AM

## 2022-01-03 ENCOUNTER — Other Ambulatory Visit
Admission: RE | Admit: 2022-01-03 | Discharge: 2022-01-03 | Disposition: A | Discharge status: Home / Self Care | Payer: Medicare Other | Attending: Ophthalmology | Admitting: Ophthalmology

## 2022-01-03 DIAGNOSIS — M316 Other giant cell arteritis: Secondary | ICD-10-CM | POA: Diagnosis present

## 2022-01-03 LAB — PLATELET COUNT: Platelets: 257 10*3/uL (ref 150–400)

## 2022-01-03 LAB — SEDIMENTATION RATE: Sed Rate: 10 mm/hr (ref 0–20)

## 2022-01-03 LAB — C-REACTIVE PROTEIN: CRP: 1 mg/dL — ABNORMAL HIGH (ref <1.0)

## 2022-02-08 ENCOUNTER — Encounter (INDEPENDENT_AMBULATORY_CARE_PROVIDER_SITE_OTHER): Payer: Medicare Other

## 2022-02-08 ENCOUNTER — Ambulatory Visit (INDEPENDENT_AMBULATORY_CARE_PROVIDER_SITE_OTHER): Payer: Medicare Other | Admitting: Vascular Surgery

## 2022-02-19 ENCOUNTER — Ambulatory Visit (INDEPENDENT_AMBULATORY_CARE_PROVIDER_SITE_OTHER): Payer: Medicare Other | Admitting: Vascular Surgery

## 2022-02-19 ENCOUNTER — Encounter (INDEPENDENT_AMBULATORY_CARE_PROVIDER_SITE_OTHER): Payer: Medicare Other

## 2022-12-13 ENCOUNTER — Ambulatory Visit: Payer: Medicare Other | Admitting: Dermatology

## 2022-12-13 DIAGNOSIS — L821 Other seborrheic keratosis: Secondary | ICD-10-CM

## 2022-12-13 DIAGNOSIS — D229 Melanocytic nevi, unspecified: Secondary | ICD-10-CM

## 2022-12-13 DIAGNOSIS — W908XXA Exposure to other nonionizing radiation, initial encounter: Secondary | ICD-10-CM

## 2022-12-13 DIAGNOSIS — L814 Other melanin hyperpigmentation: Secondary | ICD-10-CM

## 2022-12-13 DIAGNOSIS — Z1283 Encounter for screening for malignant neoplasm of skin: Secondary | ICD-10-CM

## 2022-12-13 DIAGNOSIS — L578 Other skin changes due to chronic exposure to nonionizing radiation: Secondary | ICD-10-CM | POA: Diagnosis not present

## 2022-12-13 DIAGNOSIS — L820 Inflamed seborrheic keratosis: Secondary | ICD-10-CM

## 2022-12-13 DIAGNOSIS — Z86018 Personal history of other benign neoplasm: Secondary | ICD-10-CM

## 2022-12-13 DIAGNOSIS — D1801 Hemangioma of skin and subcutaneous tissue: Secondary | ICD-10-CM

## 2022-12-13 NOTE — Progress Notes (Signed)
   Follow-Up Visit   Subjective  Tim Miranda is a 77 y.o. male who presents for the following: Skin Cancer Screening and Full Body Skin Exam  The patient presents for Total-Body Skin Exam (TBSE) for skin cancer screening and mole check. The patient has spots, moles and lesions to be evaluated, some may be new or changing and the patient may have concern these could be cancer.   The following portions of the chart were reviewed this encounter and updated as appropriate: medications, allergies, medical history  Review of Systems:  No other skin or systemic complaints except as noted in HPI or Assessment and Plan.  Objective  Well appearing patient in no apparent distress; mood and affect are within normal limits.  A full examination was performed including scalp, head, eyes, ears, nose, lips, neck, chest, axillae, abdomen, back, buttocks, bilateral upper extremities, bilateral lower extremities, hands, feet, fingers, toes, fingernails, and toenails. All findings within normal limits unless otherwise noted below.   Relevant physical exam findings are noted in the Assessment and Plan.  L bicep x 1, L mandible x 1 (2) Erythematous stuck-on, waxy papule or plaque    Assessment & Plan   SKIN CANCER SCREENING PERFORMED TODAY.  ACTINIC DAMAGE - Chronic condition, secondary to cumulative UV/sun exposure - diffuse scaly erythematous macules with underlying dyspigmentation - Recommend daily broad spectrum sunscreen SPF 30+ to sun-exposed areas, reapply every 2 hours as needed.  - Staying in the shade or wearing Swails sleeves, sun glasses (UVA+UVB protection) and wide brim hats (4-inch brim around the entire circumference of the hat) are also recommended for sun protection.  - Call for new or changing lesions.  LENTIGINES, SEBORRHEIC KERATOSES, HEMANGIOMAS - Benign normal skin lesions - Benign-appearing - Call for any changes  MELANOCYTIC NEVI - Tan-brown and/or pink-flesh-colored  symmetric macules and papules - Benign appearing on exam today - Observation - Call clinic for new or changing moles - Recommend daily use of broad spectrum spf 30+ sunscreen to sun-exposed areas.   HISTORY OF DYSPLASTIC NEVUS No evidence of recurrence today Recommend regular full body skin exams Recommend daily broad spectrum sunscreen SPF 30+ to sun-exposed areas, reapply every 2 hours as needed.  Call if any new or changing lesions are noted between office visits  Inflamed seborrheic keratosis (2) L bicep x 1, L mandible x 1  Symptomatic, irritating, patient would like treated.  Destruction of lesion - L bicep x 1, L mandible x 1 (2) Complexity: simple   Destruction method: cryotherapy   Informed consent: discussed and consent obtained   Timeout:  patient name, date of birth, surgical site, and procedure verified Lesion destroyed using liquid nitrogen: Yes   Region frozen until ice ball extended beyond lesion: Yes   Outcome: patient tolerated procedure well with no complications   Post-procedure details: wound care instructions given     Return in about 1 year (around 12/13/2023) for TBSE.  Maylene Roes, CMA, am acting as scribe for Armida Sans, MD .  Documentation: I have reviewed the above documentation for accuracy and completeness, and I agree with the above.  Armida Sans, MD

## 2022-12-13 NOTE — Patient Instructions (Signed)

## 2022-12-15 ENCOUNTER — Encounter: Payer: Self-pay | Admitting: Dermatology

## 2023-04-11 ENCOUNTER — Encounter: Payer: Self-pay | Admitting: Student in an Organized Health Care Education/Training Program

## 2023-04-11 ENCOUNTER — Ambulatory Visit
Payer: No Typology Code available for payment source | Attending: Student in an Organized Health Care Education/Training Program | Admitting: Student in an Organized Health Care Education/Training Program

## 2023-04-11 VITALS — BP 149/75 | HR 79 | Temp 97.3°F | Resp 16 | Ht 68.0 in | Wt 225.0 lb

## 2023-04-11 DIAGNOSIS — M51362 Other intervertebral disc degeneration, lumbar region with discogenic back pain and lower extremity pain: Secondary | ICD-10-CM | POA: Diagnosis not present

## 2023-04-11 DIAGNOSIS — M961 Postlaminectomy syndrome, not elsewhere classified: Secondary | ICD-10-CM | POA: Diagnosis present

## 2023-04-11 DIAGNOSIS — G8929 Other chronic pain: Secondary | ICD-10-CM

## 2023-04-11 DIAGNOSIS — M47816 Spondylosis without myelopathy or radiculopathy, lumbar region: Secondary | ICD-10-CM | POA: Diagnosis not present

## 2023-04-11 DIAGNOSIS — M5416 Radiculopathy, lumbar region: Secondary | ICD-10-CM

## 2023-04-11 NOTE — Patient Instructions (Addendum)
GENERAL RISKS AND COMPLICATIONS  What are the risk, side effects and possible complications? Generally speaking, most procedures are safe.  However, with any procedure there are risks, side effects, and the possibility of complications.  The risks and complications are dependent upon the sites that are lesioned, or the type of nerve block to be performed.  The closer the procedure is to the spine, the more serious the risks are.  Great care is taken when placing the radio frequency needles, block needles or lesioning probes, but sometimes complications can occur. Infection: Any time there is an injection through the skin, there is a risk of infection.  This is why sterile conditions are used for these blocks.  There are four possible types of infection. Localized skin infection. Central Nervous System Infection-This can be in the form of Meningitis, which can be deadly. Epidural Infections-This can be in the form of an epidural abscess, which can cause pressure inside of the spine, causing compression of the spinal cord with subsequent paralysis. This would require an emergency surgery to decompress, and there are no guarantees that the patient would recover from the paralysis. Discitis-This is an infection of the intervertebral discs.  It occurs in about 1% of discography procedures.  It is difficult to treat and it may lead to surgery.        2. Pain: the needles have to go through skin and soft tissues, will cause soreness.       3. Damage to internal structures:  The nerves to be lesioned may be near blood vessels or    other nerves which can be potentially damaged.       4. Bleeding: Bleeding is more common if the patient is taking blood thinners such as  aspirin, Coumadin, Ticiid, Plavix, etc., or if he/she have some genetic predisposition  such as hemophilia. Bleeding into the spinal canal can cause compression of the spinal  cord with subsequent paralysis.  This would require an emergency  surgery to  decompress and there are no guarantees that the patient would recover from the  paralysis.       5. Pneumothorax:  Puncturing of a lung is a possibility, every time a needle is introduced in  the area of the chest or upper back.  Pneumothorax refers to free air around the  collapsed lung(s), inside of the thoracic cavity (chest cavity).  Another two possible  complications related to a similar event would include: Hemothorax and Chylothorax.   These are variations of the Pneumothorax, where instead of air around the collapsed  lung(s), you may have blood or chyle, respectively.       6. Spinal headaches: They may occur with any procedures in the area of the spine.       7. Persistent CSF (Cerebro-Spinal Fluid) leakage: This is a rare problem, but may occur  with prolonged intrathecal or epidural catheters either due to the formation of a fistulous  track or a dural tear.       8. Nerve damage: By working so close to the spinal cord, there is always a possibility of  nerve damage, which could be as serious as a permanent spinal cord injury with  paralysis.       9. Death:  Although rare, severe deadly allergic reactions known as "Anaphylactic  reaction" can occur to any of the medications used.      10. Worsening of the symptoms:  We can always make thing worse.  What are the chances  of something like this happening? Chances of any of this occuring are extremely low.  By statistics, you have more of a chance of getting killed in a motor vehicle accident: while driving to the hospital than any of the above occurring .  Nevertheless, you should be aware that they are possibilities.  In general, it is similar to taking a shower.  Everybody knows that you can slip, hit your head and get killed.  Does that mean that you should not shower again?  Nevertheless always keep in mind that statistics do not mean anything if you happen to be on the wrong side of them.  Even if a procedure has a 1 (one) in a  1,000,000 (million) chance of going wrong, it you happen to be that one..Also, keep in mind that by statistics, you have more of a chance of having something go wrong when taking medications.  Who should not have this procedure? If you are on a blood thinning medication (e.g. Coumadin, Plavix, see list of "Blood Thinners"), or if you have an active infection going on, you should not have the procedure.  If you are taking any blood thinners, please inform your physician.  How should I prepare for this procedure? Do not eat or drink anything at least six hours prior to the procedure. Bring a driver with you .  It cannot be a taxi. Come accompanied by an adult that can drive you back, and that is strong enough to help you if your legs get weak or numb from the local anesthetic. Take all of your medicines the morning of the procedure with just enough water to swallow them. If you have diabetes, make sure that you are scheduled to have your procedure done first thing in the morning, whenever possible. If you have diabetes, take only half of your insulin dose and notify our nurse that you have done so as soon as you arrive at the clinic. If you are diabetic, but only take blood sugar pills (oral hypoglycemic), then do not take them on the morning of your procedure.  You may take them after you have had the procedure. Do not take aspirin or any aspirin-containing medications, at least eleven (11) days prior to the procedure.  They may prolong bleeding. Wear loose fitting clothing that may be easy to take off and that you would not mind if it got stained with Betadine or blood. Do not wear any jewelry or perfume Remove any nail coloring.  It will interfere with some of our monitoring equipment.  NOTE: Remember that this is not meant to be interpreted as a complete list of all possible complications.  Unforeseen problems may occur.  BLOOD THINNERS The following drugs contain aspirin or other products,  which can cause increased bleeding during surgery and should not be taken for 2 weeks prior to and 1 week after surgery.  If you should need take something for relief of minor pain, you may take acetaminophen which is found in Tylenol,m Datril, Anacin-3 and Panadol. It is not blood thinner. The products listed below are.  Do not take any of the products listed below in addition to any listed on your instruction sheet.  A.P.C or A.P.C with Codeine Codeine Phosphate Capsules #3 Ibuprofen Ridaura  ABC compound Congesprin Imuran rimadil  Advil Cope Indocin Robaxisal  Alka-Seltzer Effervescent Pain Reliever and Antacid Coricidin or Coricidin-D  Indomethacin Rufen  Alka-Seltzer plus Cold Medicine Cosprin Ketoprofen S-A-C Tablets  Anacin Analgesic Tablets or Capsules Coumadin  Korlgesic Salflex  Anacin Extra Strength Analgesic tablets or capsules CP-2 Tablets Lanoril Salicylate  Anaprox Cuprimine Capsules Levenox Salocol  Anexsia-D Dalteparin Magan Salsalate  Anodynos Darvon compound Magnesium Salicylate Sine-off  Ansaid Dasin Capsules Magsal Sodium Salicylate  Anturane Depen Capsules Marnal Soma  APF Arthritis pain formula Dewitt's Pills Measurin Stanback  Argesic Dia-Gesic Meclofenamic Sulfinpyrazone  Arthritis Bayer Timed Release Aspirin Diclofenac Meclomen Sulindac  Arthritis pain formula Anacin Dicumarol Medipren Supac  Analgesic (Safety coated) Arthralgen Diffunasal Mefanamic Suprofen  Arthritis Strength Bufferin Dihydrocodeine Mepro Compound Suprol  Arthropan liquid Dopirydamole Methcarbomol with Aspirin Synalgos  ASA tablets/Enseals Disalcid Micrainin Tagament  Ascriptin Doan's Midol Talwin  Ascriptin A/D Dolene Mobidin Tanderil  Ascriptin Extra Strength Dolobid Moblgesic Ticlid  Ascriptin with Codeine Doloprin or Doloprin with Codeine Momentum Tolectin  Asperbuf Duoprin Mono-gesic Trendar  Aspergum Duradyne Motrin or Motrin IB Triminicin  Aspirin plain, buffered or enteric coated  Durasal Myochrisine Trigesic  Aspirin Suppositories Easprin Nalfon Trillsate  Aspirin with Codeine Ecotrin Regular or Extra Strength Naprosyn Uracel  Atromid-S Efficin Naproxen Ursinus  Auranofin Capsules Elmiron Neocylate Vanquish  Axotal Emagrin Norgesic Verin  Azathioprine Empirin or Empirin with Codeine Normiflo Vitamin E  Azolid Emprazil Nuprin Voltaren  Bayer Aspirin plain, buffered or children's or timed BC Tablets or powders Encaprin Orgaran Warfarin Sodium  Buff-a-Comp Enoxaparin Orudis Zorpin  Buff-a-Comp with Codeine Equegesic Os-Cal-Gesic   Buffaprin Excedrin plain, buffered or Extra Strength Oxalid   Bufferin Arthritis Strength Feldene Oxphenbutazone   Bufferin plain or Extra Strength Feldene Capsules Oxycodone with Aspirin   Bufferin with Codeine Fenoprofen Fenoprofen Pabalate or Pabalate-SF   Buffets II Flogesic Panagesic   Buffinol plain or Extra Strength Florinal or Florinal with Codeine Panwarfarin   Buf-Tabs Flurbiprofen Penicillamine   Butalbital Compound Four-way cold tablets Penicillin   Butazolidin Fragmin Pepto-Bismol   Carbenicillin Geminisyn Percodan   Carna Arthritis Reliever Geopen Persantine   Carprofen Gold's salt Persistin   Chloramphenicol Goody's Phenylbutazone   Chloromycetin Haltrain Piroxlcam   Clmetidine heparin Plaquenil   Cllnoril Hyco-pap Ponstel   Clofibrate Hydroxy chloroquine Propoxyphen         Before stopping any of these medications, be sure to consult the physician who ordered them.  Some, such as Coumadin (Warfarin) are ordered to prevent or treat serious conditions such as "deep thrombosis", "pumonary embolisms", and other heart problems.  The amount of time that you may need off of the medication may also vary with the medication and the reason for which you were taking it.  If you are taking any of these medications, please make sure you notify your pain physician before you undergo any procedures.         Epidural Steroid  Injection Patient Information  Description: The epidural space surrounds the nerves as they exit the spinal cord.  In some patients, the nerves can be compressed and inflamed by a bulging disc or a tight spinal canal (spinal stenosis).  By injecting steroids into the epidural space, we can bring irritated nerves into direct contact with a potentially helpful medication.  These steroids act directly on the irritated nerves and can reduce swelling and inflammation which often leads to decreased pain.  Epidural steroids may be injected anywhere along the spine and from the neck to the low back depending upon the location of your pain.   After numbing the skin with local anesthetic (like Novocaine), a small needle is passed into the epidural space slowly.  You may experience a sensation of pressure while this  is being done.  The entire block usually last less than 10 minutes.  Conditions which may be treated by epidural steroids:  Low back and leg pain Neck and arm pain Spinal stenosis Post-laminectomy syndrome Herpes zoster (shingles) pain Pain from compression fractures  Preparation for the injection:  Do not eat any solid food or dairy products within 8 hours of your appointment.  You may drink clear liquids up to 3 hours before appointment.  Clear liquids include water, black coffee, juice or soda.  No milk or cream please. You may take your regular medication, including pain medications, with a sip of water before your appointment  Diabetics should hold regular insulin (if taken separately) and take 1/2 normal NPH dos the morning of the procedure.  Carry some sugar containing items with you to your appointment. A driver must accompany you and be prepared to drive you home after your procedure.  Bring all your current medications with your. An IV may be inserted and sedation may be given at the discretion of the physician.   A blood pressure cuff, EKG and other monitors will often be applied  during the procedure.  Some patients may need to have extra oxygen administered for a short period. You will be asked to provide medical information, including your allergies, prior to the procedure.  We must know immediately if you are taking blood thinners (like Coumadin/Warfarin)  Or if you are allergic to IV iodine contrast (dye). We must know if you could possible be pregnant.  Possible side-effects: Bleeding from needle site Infection (rare, may require surgery) Nerve injury (rare) Numbness & tingling (temporary) Difficulty urinating (rare, temporary) Spinal headache ( a headache worse with upright posture) Light -headedness (temporary) Pain at injection site (several days) Decreased blood pressure (temporary) Weakness in arm/leg (temporary) Pressure sensation in back/neck (temporary)  Call if you experience: Fever/chills associated with headache or increased back/neck pain. Headache worsened by an upright position. New onset weakness or numbness of an extremity below the injection site Hives or difficulty breathing (go to the emergency room) Inflammation or drainage at the infection site Severe back/neck pain Any new symptoms which are concerning to you  Please note:  Although the local anesthetic injected can often make your back or neck feel good for several hours after the injection, the pain will likely return.  It takes 3-7 days for steroids to work in the epidural space.  You may not notice any pain relief for at least that one week.  If effective, we will often do a series of three injections spaced 3-6 weeks apart to maximally decrease your pain.  After the initial series, we generally will wait several months before considering a repeat injection of the same type.  If you have any questions, please call [phone] [facility]  Stop Plavix 7 days prior [facility] to take ASA 81 mg instead during those 7 days Give pt [employer] brochure

## 2023-04-11 NOTE — Progress Notes (Signed)
Safety precautions to be maintained throughout the outpatient stay will include: orient to surroundings, keep bed in low position, maintain call bell within reach at all times, provide assistance with transfer out of bed and ambulation.

## 2023-04-11 NOTE — Progress Notes (Signed)
PROVIDER NOTE: Information contained herein reflects review and annotations entered in association with encounter. Interpretation of such information and data should be left to medically-trained personnel. Information provided to patient can be located elsewhere in the medical record under "Patient Instructions". Document created using STT-dictation technology, any transcriptional errors that may result from process are unintentional.    Patient: Tim Miranda  Service Category: E/M  Provider: Edward Jolly, MD  DOB: [birth year]  DOS: 04/11/2023  Referring Provider: Odetta Miranda,*  MRN: [id number]  Specialty: Interventional Pain Management  PCP: Tim Rud, MD  Type: Established Patient  Setting: Ambulatory outpatient    Location: Office  Delivery: Face-to-face     HPI  [name], a 78 y.o. year old male, is here today because of his Lumbar radiculopathy [M54.16]. [name] primary complain today is Back Pain  Pertinent problems: 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 Pain Assessment: Severity of Chronic pain is reported as a 8 /10. Location: Back Lower, Right, Left (pain alternates left and right sides; affects both sides simultaneously only when bending or lifteing;RIGHT is worse;)/to right hip and down back of right leg to ankle. Onset: More than a month ago. Quality: Aching, Burning, Shooting, Sharp, Constant. Timing: Constant. Modifying factor(s): heating pad, gaba, sitting. Vitals:  height is 5\' 8"  (1.727 m) and weight is 225 lb (102.1 kg). His temporal temperature is 97.3 F (36.3 C) (abnormal). His blood pressure is 149/75 (abnormal) and his pulse is 79. His respiration is 16  and oxygen saturation is 99%.  BMI: Estimated body mass index is 34.21 kg/m as calculated from the following:   Height as of this encounter: 5\' 8"  (1.727 m).   Weight as of this encounter: 225 lb (102.1 kg). Last encounter: Visit date not found. Last procedure: Visit date not found.  Reason for encounter: patient-requested evaluation.   History of Present Illness   The patient presents with worsening back and right leg pain.   The pain began years ago and radiates from his right hip down to his ankle. It is described as intermittent and significantly affects his daily activities, including climbing stairs and washing dishes. He cannot pick up his right leg and relies on his left leg for support when navigating stairs. Twisting movements exacerbate the pain, and he feels close to needing a wheelchair, though he has not experienced any falls.  He is status post L3-L4 TLIF at [facility].  He has seen me in the past for L5-S1 epidural steroid injection, last done 2022.  He is hoping to repeat that.  He has a history of arthritis in his back, which he believes contributes to his current pain. A previous CT scan at the [place] did not recommend surgery, and he was advised to try another injection for pain management. He frequently uses a heating pad while sitting to alleviate discomfort.  He is currently taking Tylenol for pain management and is on Plavix, which he plans to stop seven days before his upcoming injection. He does not take aspirin but will switch to a baby aspirin during the week he stops Plavix.  His wife does not drive to the [place], making it more convenient for him to receive care closer to home. He is planning a  family cruise with his great-grandchildren at the end of February.         ROS  Constitutional: Denies any fever or chills Gastrointestinal: No reported hemesis, hematochezia, vomiting, or acute GI distress Musculoskeletal:  Low back, right greater than left leg  pain Neurological: No reported episodes of acute onset apraxia, aphasia, dysarthria, agnosia, amnesia, paralysis, loss of coordination, or loss of consciousness  Medication Review  Cinnamon, Coenzyme Q10, Fish Oil, Misc Natural Products, Thera-Gesic, acetaminophen, allopurinol, carvedilol, cholecalciferol, citalopram, clopidogrel, cyanocobalamin, fluticasone, furosemide, gabapentin, glipiZIDE, insulin glargine, levothyroxine, losartan, montelukast, multivitamins ther. w/minerals, pravastatin, and saxagliptin HCl  History Review  Allergy: [name] is allergic to atorvastatin, isosorbide, lisinopril, and metformin and related. Drug: [name]  reports no history of drug use. Alcohol:  reports no history of alcohol use. Tobacco:  reports that he has quit smoking. His smoking use included cigarettes. He has a 0.8 pack-year smoking history. He has never used smokeless tobacco. Social: [name]  reports that he has quit smoking. His smoking use included cigarettes. He has a 0.8 pack-year smoking history. He has never used smokeless tobacco. He reports that he does not drink alcohol and does not use drugs. Medical:  has a past medical history of Actinic keratosis, Arthritis, Coronary artery disease, Degenerative disc disease, lumbar, Depression, Diabetes mellitus without complication (HCC), Dysplastic nevus (10/27/2018), Dysplastic nevus (10/27/2018), GERD (gastroesophageal reflux disease), Gout, CABG, Hypercholesteremia, Hypertension, PLMD (periodic limb movement disorder), Sleep apnea, Stroke (HCC), and Thyroid disease. Surgical: [name]  has a past surgical history that includes Back surgery; Coronary artery bypass graft; Eye surgery; Shoulder surgery; Carpal tunnel release; Colonoscopy with propofol (N/A, 09/19/2016); and Esophagogastroduodenoscopy (egd) with propofol (N/A, 09/19/2016). Family: family history includes Heart disease in his father and mother.  Laboratory Chemistry Profile   Renal Lab  Results  Component Value Date   BUN 19 11/25/2021   CREATININE 1.10 11/25/2021   GFRAA >60 11/22/2018   GFRNONAA >60 11/25/2021    Hepatic Lab Results  Component Value Date   AST 25 11/25/2021   ALT 26 11/25/2021   ALBUMIN 3.8 11/25/2021   ALKPHOS 39 11/25/2021    Electrolytes Lab Results  Component Value Date   NA 140 11/25/2021   K 4.0 11/25/2021   CL 105 11/25/2021   CALCIUM 9.1 11/25/2021   MG 2.1 09/28/2020    Bone No results found for: "VD25OH", "VD125OH2TOT", "OZ3086VH8", "IO9629BM8", "25OHVITD1", "25OHVITD2", "25OHVITD3", "TESTOFREE", "TESTOSTERONE"  Inflammation (CRP: Acute Phase) (ESR: Chronic Phase) Lab Results  Component Value Date   CRP 1.0 (H) 01/03/2022   ESRSEDRATE 10 01/03/2022         Note: Above Lab results reviewed.  Recent Imaging Review  VAS US CAROTID Carotid Arterial Duplex Study  Patient Name:  Tim Miranda  Date of Exam:   01/01/2022 Medical Rec #: [id number]       Accession #:    [id number] Date of Birth: 08/16/1945        Patient Gender: M Patient Age:   44 years Exam Location:  [facility] Procedure:      VAS US CAROTID Referring Phys: Levora Dredge  --------------------------------------------------------------------------------   Indications: Carotid artery disease.  Performing Technologist: Jamse Mead RT, RDMS, RVT    Examination Guidelines: A complete evaluation includes B-mode imaging, spectral Doppler, color Doppler, and power Doppler as needed of all accessible portions of each vessel. Bilateral testing is considered an integral part of a complete examination. Limited examinations for reoccurring indications may be performed  as noted.    Right Carotid Findings: +----------+--------+--------+--------+------------------+--------+           PSV cm/sEDV cm/sStenosisPlaque DescriptionComments +----------+--------+--------+--------+------------------+--------+ CCA Prox  57      12                                          +----------+--------+--------+--------+------------------+--------+ CCA Mid   75      15                                         +----------+--------+--------+--------+------------------+--------+ CCA Distal62      14                                         +----------+--------+--------+--------+------------------+--------+ ICA Prox  68      16      1-39%   heterogenous               +----------+--------+--------+--------+------------------+--------+ ICA Mid   77      23                                         +----------+--------+--------+--------+------------------+--------+ ICA Distal80      16                                         +----------+--------+--------+--------+------------------+--------+ ECA       61      9               heterogenous               +----------+--------+--------+--------+------------------+--------+  +----------+--------+-------+----------------+-------------------+           PSV cm/sEDV cmsDescribe        Arm Pressure (mmHG) +----------+--------+-------+----------------+-------------------+ Subclavian110            Multiphasic, WNL                    +----------+--------+-------+----------------+-------------------+  +---------+--------+--+--------+---------+ VertebralPSV cm/s32EDV cm/sAntegrade +---------+--------+--+--------+---------+    Left Carotid Findings: +----------+--------+--------+--------+------------------+--------+           PSV cm/sEDV cm/sStenosisPlaque DescriptionComments +----------+--------+--------+--------+------------------+--------+ CCA Prox  81      10                                         +----------+--------+--------+--------+------------------+--------+ CCA Mid   70      14                                         +----------+--------+--------+--------+------------------+--------+ CCA Distal61      11                                          +----------+--------+--------+--------+------------------+--------+ ICA Prox  95      26      1-39%   heterogenous               +----------+--------+--------+--------+------------------+--------+ ICA Mid   74      18                                         +----------+--------+--------+--------+------------------+--------+ ICA Distal72      19                                         +----------+--------+--------+--------+------------------+--------+ ECA       60      5                                          +----------+--------+--------+--------+------------------+--------+  +----------+--------+--------+----------------+-------------------+           PSV cm/sEDV cm/sDescribe        Arm Pressure (mmHG) +----------+--------+--------+----------------+-------------------+ [id number]              Multiphasic, WNL                    +----------+--------+--------+----------------+-------------------+  +---------+--------+--+--------+-+---------+ VertebralPSV cm/s44EDV cm/s9Antegrade +---------+--------+--+--------+-+---------+    Summary: Right Carotid: The extracranial vessels were near-normal with only minimal wall                thickening or plaque.  Left Carotid: The extracranial vessels were near-normal with only minimal wall               thickening or plaque.    *See table(s) above for measurements and observations.    Electronically signed by Levora Dredge MD on 01/01/2022 at 2:06:49 PM.      Final   Note: Reviewed        Physical Exam  General appearance: Well nourished, well developed, and well hydrated. In no apparent acute distress Mental status: Alert, oriented x 3 (person, place, & time)       Respiratory: No evidence of acute respiratory distress Eyes: PERLA Vitals: BP (!) 149/75 (BP Location: Left Arm, Patient Position: Sitting, Cuff Size: Large)   Pulse 79   Temp (!) 97.3 F  (36.3 C) (Temporal)   Resp 16   Ht 5\' 8"  (1.727 m)   Wt 225 lb (102.1 kg)   SpO2 99%   BMI 34.21 kg/m  BMI: Estimated body mass index is 34.21 kg/m as calculated from the following:   Height as of this encounter: 5\' 8"  (1.727 m).   Weight as of this encounter: 225 lb (102.1 kg). Ideal: Ideal body weight: 68.4 kg (150 lb 12.7 oz) Adjusted ideal body weight: 81.9 kg (180 lb 7.6 oz)  Low back, right greater than left leg pain  Assessment   Diagnosis  1. Lumbar radiculopathy   2. Chronic radicular lumbar pain   3. Lumbar facet arthropathy   4. Lumbar spondylosis   5. Degeneration of intervertebral disc of lumbar region with discogenic back pain and lower extremity pain   6. Failed back surgical syndrome      Updated Problems: No problems updated.  Plan of Care  Problem-specific:  Assessment and Plan    Chronic Back Pain with  Radiculopathy   Chronic back pain radiates from the right hip to the ankle, worsening over the past year and severely impacting daily activities like walking, climbing stairs, and bending. A previous CT scan at the [facility] showed no repeat surgical indication, though arthritis in the back may contribute to the pain. Prior surgery has been performed, and further interventions are under consideration. Informed consent for an injection has been obtained, with risks, benefits, and alternatives discussed. He prefers treatment at this facility due to convenience and past positive experiences. A spinal cord stimulator is being considered as a Tim option. Schedule the injection before the end of February. Discuss the spinal cord stimulator as a Tim option. Stop Plavix 7 days prior to the injection and take aspirin 81 mg instead for the week prior. Follow up in two weeks for the injection.  General Health Maintenance   Aside from back pain, he is generally healthy. No other specific health maintenance issues were discussed. Continue current medications as prescribed.  Follow up in two weeks for the injection.       [name] has a current medication list which includes the following [place]-term medication(s): allopurinol, allopurinol, carvedilol, citalopram, furosemide, gabapentin, gabapentin, insulin glargine, insulin glargine, levothyroxine, losartan, losartan, montelukast, onglyza, and pravastatin.  Pharmacotherapy (Medications Ordered): No orders of the defined types were placed in this encounter.  Orders:  Orders Placed This Encounter  Procedures   Lumbar Epidural Injection    Standing Status:   Tim    Expiration Date:   07/10/2023    Scheduling Instructions:     Procedure: Interlaminar Lumbar Epidural Steroid injection (LESI)            Laterality: L5-S1     Sedation: without     Timeframe: ASAA    Where will this procedure be performed?:   [facility]   Follow-up plan:   Return in about 18 days (around 04/29/2023) for L5/S1 ESI (stop Plavix 7 days prior).      Recent Visits No visits were found meeting these conditions. Showing recent visits within past 90 days and meeting all other requirements Today's Visits Date Type Provider Dept  04/11/23 Office Visit Tim Jolly, MD [facility]  Showing today's visits and meeting all other requirements Tim Appointments Date Type Provider Dept  04/29/23 Appointment Tim Jolly, MD [facility]  Showing Tim appointments within next 90 days and meeting all other requirements  I discussed the assessment and treatment plan with the patient. The patient was provided an opportunity to ask questions and all were answered. The patient agreed with the plan and demonstrated an understanding of the instructions.  Patient advised to call back or seek an in-person evaluation if the symptoms or condition worsens.  Duration of encounter: .  Total time on encounter, as per AMA guidelines included both the face-to-face and non-face-to-face time  personally spent by the physician and/or other qualified health care professional(s) on the day of the encounter (includes time in activities that require the physician or other qualified health care professional and does not include time in activities normally performed by clinical staff). Physician's time may include the following activities when performed: Preparing to see the patient (e.g., pre-charting review of records, searching for previously ordered imaging, lab work, and nerve conduction tests) Review of prior analgesic pharmacotherapies. Reviewing PMP Interpreting ordered tests (e.g., lab work, imaging, nerve conduction tests) Performing post-procedure evaluations, including interpretation of diagnostic procedures Obtaining and/or reviewing separately obtained history Performing  a medically appropriate examination and/or evaluation Counseling and educating the patient/family/caregiver Ordering medications, tests, or procedures Referring and communicating with other health care professionals (when not separately reported) Documenting clinical information in the electronic or other health record Independently interpreting results (not separately reported) and communicating results to the patient/ family/caregiver Care coordination (not separately reported)  Note by: Tim Jolly, MD Date: 04/11/2023; Time: 10:57 AM

## 2023-04-29 ENCOUNTER — Ambulatory Visit
Admission: RE | Admit: 2023-04-29 | Discharge: 2023-04-29 | Disposition: A | Discharge status: Home / Self Care | Payer: Medicare Other | Source: Ambulatory Visit | Attending: Student in an Organized Health Care Education/Training Program | Admitting: Student in an Organized Health Care Education/Training Program

## 2023-04-29 ENCOUNTER — Encounter: Payer: Self-pay | Admitting: Student in an Organized Health Care Education/Training Program

## 2023-04-29 ENCOUNTER — Ambulatory Visit
Payer: No Typology Code available for payment source | Attending: Student in an Organized Health Care Education/Training Program | Admitting: Student in an Organized Health Care Education/Training Program

## 2023-04-29 VITALS — BP 160/76 | HR 74 | Temp 97.2°F | Resp 17 | Ht 69.0 in | Wt 225.0 lb

## 2023-04-29 DIAGNOSIS — G8929 Other chronic pain: Secondary | ICD-10-CM | POA: Insufficient documentation

## 2023-04-29 DIAGNOSIS — M5416 Radiculopathy, lumbar region: Secondary | ICD-10-CM | POA: Diagnosis present

## 2023-04-29 MED ORDER — LIDOCAINE HCL (PF) 2 % IJ SOLN
INTRAMUSCULAR | Status: AC
  Filled 2023-04-29: qty 10

## 2023-04-29 MED ORDER — SODIUM CHLORIDE (PF) 0.9 % IJ SOLN
INTRAMUSCULAR | Status: AC
  Filled 2023-04-29: qty 10

## 2023-04-29 MED ORDER — ROPIVACAINE HCL 2 MG/ML IJ SOLN
2.0000 mL | Freq: Once | INTRAMUSCULAR | Status: AC
  Administered 2023-04-29: 2 mL via EPIDURAL

## 2023-04-29 MED ORDER — ROPIVACAINE HCL 2 MG/ML IJ SOLN
INTRAMUSCULAR | Status: AC
  Filled 2023-04-29: qty 20

## 2023-04-29 MED ORDER — DEXAMETHASONE SODIUM PHOSPHATE 10 MG/ML IJ SOLN
INTRAMUSCULAR | Status: AC
  Filled 2023-04-29: qty 1

## 2023-04-29 MED ORDER — SODIUM CHLORIDE 0.9% FLUSH
2.0000 mL | Freq: Once | INTRAVENOUS | Status: AC
  Administered 2023-04-29: 2 mL

## 2023-04-29 MED ORDER — DEXAMETHASONE SODIUM PHOSPHATE 10 MG/ML IJ SOLN
10.0000 mg | Freq: Once | INTRAMUSCULAR | Status: AC
  Administered 2023-04-29: 10 mg

## 2023-04-29 MED ORDER — LIDOCAINE HCL 2 % IJ SOLN
20.0000 mL | Freq: Once | INTRAMUSCULAR | Status: AC
  Administered 2023-04-29: 100 mg

## 2023-04-29 MED ORDER — IOHEXOL 180 MG/ML  SOLN
10.0000 mL | Freq: Once | INTRAMUSCULAR | Status: AC
  Administered 2023-04-29: 10 mL via EPIDURAL

## 2023-04-29 NOTE — Patient Instructions (Signed)

## 2023-04-29 NOTE — Progress Notes (Signed)
 Patient's Name: Tim Miranda  MRN: [id number]  Referring Provider: Kandyce Rud, MD  DOB: 1946-02-24  PCP: Kandyce Rud, MD  DOS: 04/29/2023  Note by: Edward Jolly, MD  Service setting: Ambulatory outpatient  Specialty: Interventional Pain Management  Patient type: Established  Location: [facility] (AMB) Pain Management Facility  Visit type: Interventional Procedure   Primary Reason for Visit: Interventional Pain Management Treatment. CC: Back Pain (Lumbar bilateral )  Procedure:          Anesthesia, Analgesia, Anxiolysis:  Type: Therapeutic Inter-Laminar Epidural Steroid Injection  #1 for 2025 Region: Lumbar Level: L5-S1 Level. Laterality: Right         Type: Local Anesthesia  Local Anesthetic: Lidocaine 1-2%  Position: Prone with head of the table was raised to facilitate breathing.   Indications: 1. Lumbar radiculopathy   2. Chronic radicular lumbar pain    Stopped Plavix 7 days prior   Pain Score: Pre-procedure: 9 /10 Post-procedure: 9 /10   Pre-op Assessment:  Tim Miranda is a 78 y.o. (year old), male patient, seen today for interventional treatment. He  has a past surgical history that includes Back surgery; Coronary artery bypass graft; Eye surgery; Shoulder surgery; Carpal tunnel release; Colonoscopy with propofol (N/A, 09/19/2016); and Esophagogastroduodenoscopy (egd) with propofol (N/A, 09/19/2016). Tim Miranda has a current medication list which includes the following prescription(s): acetaminophen, allopurinol, allopurinol, carvedilol, cholecalciferol, cinnamon, citalopram, clopidogrel, coenzyme q10, diclofenac sodium, fluticasone, gabapentin, gabapentin, glipizide, insulin glargine, insulin glargine, levothyroxine, linagliptin, losartan, losartan, thera-gesic, misc natural products, montelukast, multivitamins ther. w/minerals, fish oil, onglyza, pravastatin, cyanocobalamin, and furosemide. His primarily concern today is the Back Pain (Lumbar bilateral )  Initial Vital Signs:   Pulse/HCG Rate: 74ECG Heart Rate: 70 Temp: (!) 97.2 F (36.2 C) Resp: 16 BP: (!) 189/81 SpO2: 99 %  BMI: Estimated body mass index is 33.23 kg/m as calculated from the following:   Height as of this encounter: 5\' 9"  (1.753 m).   Weight as of this encounter: 225 lb (102.1 kg).  Risk Assessment: Allergies: Reviewed. He is allergic to atorvastatin, isosorbide, lisinopril, and metformin and related.  Allergy Precautions: None required Coagulopathies: Reviewed. None identified.  Blood-thinner therapy: None at this time Active Infection(s): Reviewed. None identified. Tim Miranda is afebrile  Site Confirmation: Tim Miranda was asked to confirm the procedure and laterality before marking the site Procedure checklist: Completed Consent: Before the procedure and under the influence of no sedative(s), amnesic(s), or anxiolytics, the patient was informed of the treatment options, risks and possible complications. To fulfill our ethical and legal obligations, as recommended by the American Medical Association's Code of Ethics, I have informed the patient of my clinical impression; the nature and purpose of the treatment or procedure; the risks, benefits, and possible complications of the intervention; the alternatives, including doing nothing; the risk(s) and benefit(s) of the alternative treatment(s) or procedure(s); and the risk(s) and benefit(s) of doing nothing. The patient was provided information about the general risks and possible complications associated with the procedure. These may include, but are not limited to: failure to achieve desired goals, infection, bleeding, organ or nerve damage, allergic reactions, paralysis, and death. In addition, the patient was informed of those risks and complications associated to Spine-related procedures, such as failure to decrease pain; infection (i.e.: Meningitis, epidural or intraspinal abscess); bleeding (i.e.: epidural hematoma, subarachnoid hemorrhage, or  any other type of intraspinal or peri-dural bleeding); organ or nerve damage (i.e.: Any type of peripheral nerve, nerve root, or spinal cord injury) with subsequent damage  to sensory, motor, and/or autonomic systems, resulting in permanent pain, numbness, and/or weakness of one or several areas of the body; allergic reactions; (i.e.: anaphylactic reaction); and/or death. Furthermore, the patient was informed of those risks and complications associated with the medications. These include, but are not limited to: allergic reactions (i.e.: anaphylactic or anaphylactoid reaction(s)); adrenal axis suppression; blood sugar elevation that in diabetics may result in ketoacidosis or comma; water retention that in patients with history of congestive heart failure may result in shortness of breath, pulmonary edema, and decompensation with resultant heart failure; weight gain; swelling or edema; medication-induced neural toxicity; particulate matter embolism and blood vessel occlusion with resultant organ, and/or nervous system infarction; and/or aseptic necrosis of one or more joints. Finally, the patient was informed that Medicine is not an exact science; therefore, there is also the possibility of unforeseen or unpredictable risks and/or possible complications that may result in a catastrophic outcome. The patient indicated having understood very clearly. We have given the patient no guarantees and we have made no promises. Enough time was given to the patient to ask questions, all of which were answered to the patient's satisfaction. Tim Miranda has indicated that he wanted to continue with the procedure. Attestation: I, the ordering provider, attest that I have discussed with the patient the benefits, risks, side-effects, alternatives, likelihood of achieving goals, and potential problems during recovery for the procedure that I have provided informed consent. Date  Time: 04/29/2023  9:14 AM  Pre-Procedure Preparation:   Monitoring: As per clinic protocol. Respiration, ETCO2, SpO2, BP, heart rate and rhythm monitor placed and checked for adequate function Safety Precautions: Patient was assessed for positional comfort and pressure points before starting the procedure. Time-out: I initiated and conducted the "Time-out" before starting the procedure, as per protocol. The patient was asked to participate by confirming the accuracy of the "Time Out" information. Verification of the correct person, site, and procedure were performed and confirmed by me, the nursing staff, and the patient. "Time-out" conducted as per Joint Commission's Universal Protocol (UP.01.01.01). Time: 4782  Description of Procedure:          Target Area: The interlaminar space, initially targeting the lower laminar border of the superior vertebral body. Approach: Paramedial approach. Area Prepped: Entire Posterior Lumbar Region Prepping solution: DuraPrep (Iodine Povacrylex [0.7% available iodine] and Isopropyl Alcohol, 74% w/w) Safety Precautions: Aspiration looking for blood return was conducted prior to all injections. At no point did we inject any substances, as a needle was being advanced. No attempts were made at seeking any paresthesias. Safe injection practices and needle disposal techniques used. Medications properly checked for expiration dates. SDV (single dose vial) medications used. Description of the Procedure: Protocol guidelines were followed. The procedure needle was introduced through the skin, ipsilateral to the reported pain, and advanced to the target area. Bone was contacted and the needle walked caudad, until the lamina was cleared. The epidural space was identified using "loss-of-resistance technique" with 2-3 ml of PF-NaCl (0.9% NSS), in a 5cc LOR glass syringe.  Vitals:   04/29/23 0915 04/29/23 0944 04/29/23 0952 04/29/23 0957  BP: (!) 189/81 (!) 178/81 (!) 151/81 (!) 160/76  Pulse: 74     Resp: 16 18 18 17   Temp: (!)  97.2 F (36.2 C)     TempSrc: Temporal     SpO2: 99% 98% 95% 95%  Weight: 225 lb (102.1 kg)     Height: 5\' 9"  (1.753 m)       Start Time:  0954 hrs. End Time: 0957 hrs.  Materials:  Needle(s) Type: Epidural needle Gauge: 22G Length: 3.5-in Medication(s): Please see orders for medications and dosing details. 5 cc solution made of 2 cc of preservative-free saline, 2 cc of 0.2% ropivacaine, 1 cc of Decadron 10 mg/cc.  Imaging Guidance (Spinal):          Type of Imaging Technique: Fluoroscopy Guidance (Spinal) Indication(s): Assistance in needle guidance and placement for procedures requiring needle placement in or near specific anatomical locations not easily accessible without such assistance. Exposure Time: Please see nurses notes. Contrast: Before injecting any contrast, we confirmed that the patient did not have an allergy to iodine, shellfish, or radiological contrast. Once satisfactory needle placement was completed at the desired level, radiological contrast was injected. Contrast injected under live fluoroscopy. No contrast complications. See chart for type and volume of contrast used. Fluoroscopic Guidance: I was personally present during the use of fluoroscopy. "Tunnel Vision Technique" used to obtain the best possible view of the target area. Parallax error corrected before commencing the procedure. "Direction-depth-direction" technique used to introduce the needle under continuous pulsed fluoroscopy. Once target was reached, antero-posterior, oblique, and lateral fluoroscopic projection used confirm needle placement in all planes. Images permanently stored in EMR. Interpretation: I personally interpreted the imaging intraoperatively. Adequate needle placement confirmed in multiple planes. Appropriate spread of contrast into desired area was observed. No evidence of afferent or efferent intravascular uptake. No intrathecal or subarachnoid spread observed. Permanent images saved into  the patient's record.   Post-operative Assessment:  Post-procedure Vital Signs:  Pulse/HCG Rate: 7469 Temp: (!) 97.2 F (36.2 C) Resp: 17 BP: (!) 160/76 SpO2: 95 %  EBL: None  Complications: No immediate post-treatment complications observed by team, or reported by patient.  Note: The patient tolerated the entire procedure well. A repeat set of vitals were taken after the procedure and the patient was kept under observation following institutional policy, for this type of procedure. Post-procedural neurological assessment was performed, showing return to baseline, prior to discharge. The patient was provided with post-procedure discharge instructions, including a section on how to identify potential problems. Should any problems arise concerning this procedure, the patient was given instructions to immediately contact us, at any time, without hesitation. In any case, we plan to contact the patient by telephone for a follow-up status report regarding this interventional procedure.  Comments:  No additional relevant information. 5 out of 5 strength bilateral lower extremity: Plantar flexion, dorsiflexion, knee flexion, knee extension.  Plan of Care  Restart Plavix tomorrow Orders Placed This Encounter  Procedures   DG PAIN [facility] C-ARM 1-60 MIN NO REPORT    Intraoperative interpretation by procedural physician at [facility].    Standing Status:   Standing    Number of Occurrences:   1    Reason for exam::   Assistance in needle guidance and placement for procedures requiring needle placement in or near specific anatomical locations not easily accessible without such assistance.     Medications ordered for procedure: Meds ordered this encounter  Medications   iohexol (OMNIPAQUE) 180 MG/ML injection 10 mL    Must be Myelogram-compatible. If not available, you may substitute with a water-soluble, non-ionic, hypoallergenic, myelogram-compatible radiological contrast medium.    lidocaine (XYLOCAINE) 2 % (with pres) injection 400 mg   ropivacaine (PF) 2 mg/mL (0.2%) (NAROPIN) injection 2 mL   sodium chloride flush (NS) 0.9 % injection 2 mL   dexamethasone (DECADRON) injection 10 mg   Medications administered:  We administered iohexol, lidocaine, ropivacaine (PF) 2 mg/mL (0.2%), sodium chloride flush, and dexamethasone.  See the medical record for exact dosing, route, and time of administration.  Follow-up plan:   Return in about 4 weeks (around 05/27/2023) for F2F PPE (discuss SCS).       Recent Visits Date Type Provider Dept  04/11/23 Office Visit Edward Jolly, MD [facility]  Showing recent visits within past 90 days and meeting all other requirements Today's Visits Date Type Provider Dept  04/29/23 Procedure visit Edward Jolly, MD [facility]  Showing today's visits and meeting all other requirements Future Appointments Date Type Provider Dept  05/27/23 Appointment Edward Jolly, MD [facility]  Showing future appointments within next 90 days and meeting all other requirements  Disposition: Discharge home  Discharge Date & Time: 04/29/2023; 1010 hrs.   Primary Care Physician: Kandyce Rud, MD Location: [facility] Note by: Edward Jolly, MD Date: 04/29/2023; Time: 10:28 AM  Disclaimer:  Medicine is not an exact science. The only guarantee in medicine is that nothing is guaranteed. It is important to note that the decision to proceed with this intervention was based on the information collected from the patient. The Data and conclusions were drawn from the patient's questionnaire, the interview, and the physical examination. Because the information was provided in large part by the patient, it cannot be guaranteed that it has not been purposely or unconsciously manipulated. Every effort has been made to obtain as much relevant data as possible for this evaluation. It is important to note that  the conclusions that lead to this procedure are derived in large part from the available data. Always take into account that the treatment will also be dependent on availability of resources and existing treatment guidelines, considered by other Pain Management Practitioners as being common knowledge and practice, at the time of the intervention. For Medico-Legal purposes, it is also important to point out that variation in procedural techniques and pharmacological choices are the acceptable norm. The indications, contraindications, technique, and results of the above procedure should only be interpreted and judged by a Board-Certified Interventional Pain Specialist with extensive familiarity and expertise in the same exact procedure and technique.

## 2023-04-29 NOTE — Progress Notes (Signed)
 Safety precautions to be maintained throughout the outpatient stay will include: orient to surroundings, keep bed in low position, maintain call bell within reach at all times, provide assistance with transfer out of bed and ambulation.

## 2023-04-30 ENCOUNTER — Telehealth: Payer: Self-pay

## 2023-04-30 NOTE — Telephone Encounter (Signed)
 Post procedure follow up.  LM

## 2023-05-27 ENCOUNTER — Ambulatory Visit: Payer: Medicare Other | Admitting: Student in an Organized Health Care Education/Training Program

## 2023-05-29 ENCOUNTER — Encounter: Payer: Self-pay | Admitting: Student in an Organized Health Care Education/Training Program

## 2023-05-29 ENCOUNTER — Ambulatory Visit
Payer: Medicare Other | Attending: Student in an Organized Health Care Education/Training Program | Admitting: Student in an Organized Health Care Education/Training Program

## 2023-05-29 VITALS — BP 130/88 | HR 97 | Temp 97.0°F | Resp 18 | Ht 68.0 in | Wt 225.0 lb

## 2023-05-29 DIAGNOSIS — M5416 Radiculopathy, lumbar region: Secondary | ICD-10-CM | POA: Diagnosis present

## 2023-05-29 DIAGNOSIS — M961 Postlaminectomy syndrome, not elsewhere classified: Secondary | ICD-10-CM

## 2023-05-29 DIAGNOSIS — G8929 Other chronic pain: Secondary | ICD-10-CM | POA: Diagnosis present

## 2023-05-29 NOTE — Patient Instructions (Signed)
 ______________________________________________________________________    Preparing for your procedure  Appointments: If you think you may not be able to keep your appointment, call 24-48 hours in advance to cancel. We need time to make it available to others.  Procedure visits are for procedures only. During your procedure appointment there will be: NO Prescription Refills*. NO medication changes or discussions*. NO discussion of disability issues*. NO unrelated pain problem evaluations*. NO evaluations to order other pain procedures*. *These will be addressed at a separate and distinct evaluation encounter on the provider's evaluation schedule and not during procedure days.  Instructions: Food intake: Avoid eating anything solid for at least 8 hours prior to your procedure. Clear liquid intake: You may take clear liquids such as water up to 2 hours prior to your procedure. (No carbonated drinks. No soda.) Transportation: Unless otherwise stated by your physician, bring a driver. (Driver cannot be a Market researcher, Pharmacist, community, or any other form of public transportation.) Morning Medicines: Except for blood thinners, take all of your other morning medications with a sip of water. Make sure to take your heart and blood pressure medicines. If your blood pressure's lower number is above 100, the case will be rescheduled. Blood thinners: Make sure to stop your blood thinners as instructed.  If you take a blood thinner, but were not instructed to stop it, call our office [phone] and ask to talk to a nurse. Not stopping a blood thinner prior to certain procedures could lead to serious complications. Diabetics on insulin: Notify the staff so that you can be scheduled 1st case in the morning. If your diabetes requires high dose insulin, take only  of your normal insulin dose the morning of the procedure and notify the staff that you have done so. Preventing infections: Shower with an antibacterial soap the  morning of your procedure.  Build-up your immune system: Take 1000 mg of Vitamin C with every meal (3 times a day) the day prior to your procedure. Antibiotics: Inform the nursing staff if you are taking any antibiotics or if you have any conditions that may require antibiotics prior to procedures. (Example: recent joint implants)   Pregnancy: If you are pregnant make sure to notify the nursing staff. Not doing so may result in injury to the fetus, including death.  Sickness: If you have a cold, fever, or any active infections, call and cancel or reschedule your procedure. Receiving steroids while having an infection may result in complications. Arrival: You must be in the facility at least 30 minutes prior to your scheduled procedure. Tardiness: Your scheduled time is also the cutoff time. If you do not arrive at least 15 minutes prior to your procedure, you will be rescheduled.  Children: Do not bring any children with you. Make arrangements to keep them home. Dress appropriately: There is always a possibility that your clothing may get soiled. Avoid long dresses. Valuables: Do not bring any jewelry or valuables.  Reasons to call and reschedule or cancel your procedure: (Following these recommendations will minimize the risk of a serious complication.) Surgeries: Avoid having procedures within 2 weeks of any surgery. (Avoid for 2 weeks before or after any surgery). Flu Shots: Avoid having procedures within 2 weeks of a flu shots or . (Avoid for 2 weeks before or after immunizations). Barium: Avoid having a procedure within 7-10 days after having had a radiological study involving the use of radiological contrast. (Myelograms, Barium swallow or enema study). Heart attacks: Avoid any elective procedures or surgeries for the  initial 6 months after a "Myocardial Infarction" (Heart Attack). Blood thinners: It is imperative that you stop these medications before procedures. Let us know if you if you take  any blood thinner.  Infection: Avoid procedures during or within two weeks of an infection (including chest colds or gastrointestinal problems). Symptoms associated with infections include: Localized redness, fever, chills, night sweats or profuse sweating, burning sensation when voiding, cough, congestion, stuffiness, runny nose, sore throat, diarrhea, nausea, vomiting, cold or Flu symptoms, recent or current infections. It is specially important if the infection is over the area that we intend to treat. Heart and lung problems: Symptoms that may suggest an active cardiopulmonary problem include: cough, chest pain, breathing difficulties or shortness of breath, dizziness, ankle swelling, uncontrolled high or unusually low blood pressure, and/or palpitations. If you are experiencing any of these symptoms, cancel your procedure and contact your primary care physician for an evaluation.  Remember:  Regular Business hours are:  Monday to Thursday 8:00 AM to 4:00 PM  Provider's Schedule: Delano Metz, MD:  Procedure days: Tuesday and Thursday 7:30 AM to 4:00 PM  Edward Jolly, MD:  Procedure days: Monday and Wednesday 7:30 AM to 4:00 PM Last  Updated: 02/19/2023 ______________________________________________________________________    Epidural Steroid Injection Patient Information  Description: The epidural space surrounds the nerves as they exit the spinal cord.  In some patients, the nerves can be compressed and inflamed by a bulging disc or a tight spinal canal (spinal stenosis).  By injecting steroids into the epidural space, we can bring irritated nerves into direct contact with a potentially helpful medication.  These steroids act directly on the irritated nerves and can reduce swelling and inflammation which often leads to decreased pain.  Epidural steroids may be injected anywhere along the spine and from the neck to the low back depending upon the location of your pain.   After numbing  the skin with local anesthetic (like Novocaine), a small needle is passed into the epidural space slowly.  You may experience a sensation of pressure while this is being done.  The entire block usually last less than 10 minutes.  Conditions which may be treated by epidural steroids:  Low back and leg pain Neck and arm pain Spinal stenosis Post-laminectomy syndrome Herpes zoster (shingles) pain Pain from compression fractures  Preparation for the injection:  Do not eat any solid food or dairy products within 8 hours of your appointment.  You may drink clear liquids up to 3 hours before appointment.  Clear liquids include water, black coffee, juice or soda.  No milk or cream please. You may take your regular medication, including pain medications, with a sip of water before your appointment  Diabetics should hold regular insulin (if taken separately) and take 1/2 normal NPH dos the morning of the procedure.  Carry some sugar containing items with you to your appointment. A driver must accompany you and be prepared to drive you home after your procedure.  Bring all your current medications with your. An IV may be inserted and sedation may be given at the discretion of the physician.   A blood pressure cuff, EKG and other monitors will often be applied during the procedure.  Some patients may need to have extra oxygen administered for a short period. You will be asked to provide medical information, including your allergies, prior to the procedure.  We must know immediately if you are taking blood thinners (like Coumadin/Warfarin)  Or if you are allergic to IV  iodine contrast (dye). We must know if you could possible be pregnant.  Possible side-effects: Bleeding from needle site Infection (rare, may require surgery) Nerve injury (rare) Numbness & tingling (temporary) Difficulty urinating (rare, temporary) Spinal headache ( a headache worse with upright posture) Light -headedness  (temporary) Pain at injection site (several days) Decreased blood pressure (temporary) Weakness in arm/leg (temporary) Pressure sensation in back/neck (temporary)  Call if you experience: Fever/chills associated with headache or increased back/neck pain. Headache worsened by an upright position. New onset weakness or numbness of an extremity below the injection site Hives or difficulty breathing (go to the emergency room) Inflammation or drainage at the infection site Severe back/neck pain Any new symptoms which are concerning to you  Please note:  Although the local anesthetic injected can often make your back or neck feel good for several hours after the injection, the pain will likely return.  It takes 3-7 days for steroids to work in the epidural space.  You may not notice any pain relief for at least that one week.  If effective, we will often do a series of three injections spaced 3-6 weeks apart to maximally decrease your pain.  After the initial series, we generally will wait several months before considering a repeat injection of the same type.  If you have any questions, please call [phone] [facility]

## 2023-05-29 NOTE — Progress Notes (Signed)
 PROVIDER NOTE: Information contained herein reflects review and annotations entered in association with encounter. Interpretation of such information and data should be left to medically-trained personnel. Information provided to patient can be located elsewhere in the medical record under "Patient Instructions". Document created using STT-dictation technology, any transcriptional errors that may result from process are unintentional.    Patient: Tim Miranda  Service Category: E/M  Provider: Edward Jolly, MD  DOB: [birth year]  DOS: 05/29/2023  Referring Provider: Kandyce Rud, MD  MRN: [id number]  Specialty: Interventional Pain Management  PCP: Kandyce Rud, MD  Type: Established Patient  Setting: Ambulatory outpatient    Location: Office  Delivery: Face-to-face     HPI  [name], a 78 y.o. year old male, is here today because of his Lumbar radiculopathy [M54.16]. [name] primary complain today is Back Pain (lower)  Pertinent problems: 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 Pain Assessment: Severity of Chronic pain is reported as a 0-No pain/10. Location: Back Lower/denies. Onset: More than a month ago. Quality: Archie Patten. Timing: Intermittent. Modifying factor(s): heating pad, medications, rest. Vitals:  height is 5\' 8"  (1.727 m) and weight is 225 lb (102.1 kg). His temporal temperature is 97 F (36.1 C) (abnormal). His blood pressure is 130/88 and his pulse is 97. His respiration is 18 and oxygen saturation is 74% (abnormal).  BMI: Estimated body mass index is 34.21 kg/m as calculated from the following:   Height as of this encounter: 5\' 8"  (1.727 m).   Weight as of this  encounter: 225 lb (102.1 kg). Last encounter: 04/11/2023. Last procedure: 04/29/2023.  Reason for encounter: post-procedure evaluation and assessment.    Post-procedure evaluation  Type: Therapeutic Inter-Laminar Epidural Steroid Injection  #1 for 2025 Region: Lumbar Level: L5-S1 Level. Laterality: Right  Effectiveness:  Initial hour after procedure: 20 %  Subsequent 4-6 hours post-procedure: 50 %  Analgesia past initial 6 hours: 80 %  Ongoing improvement:  Analgesic:  80% Function: [name] reports improvement in function ROM: [name] reports improvement in ROM   Discussed the use of AI scribe software for clinical note transcription with the patient, who gave verbal consent to proceed.  History of Present Illness   Tim Miranda is a 78 year old male who presents for follow-up after his right L5-S1 ESI as detailed above.  He has experienced significant pain relief after his lumbar epidural steroid injection.Marland Kitchen He is now able to drive comfortably, which was not possible before the treatment. His family, including his daughter, have observed that he is doing much better since the treatment.  He went on a cruise and he states that overall he had a good time.  He would like to defer spinal cord stimulator trial at this time given his positive response from his right lumbar epidural steroid injection.  This is reasonable.       ROS  Constitutional: Denies any fever or chills Gastrointestinal: No reported hemesis, hematochezia, vomiting, or acute GI distress Musculoskeletal: Denies any acute onset joint swelling, redness, loss of ROM, or weakness Neurological: No reported episodes of acute onset apraxia, aphasia, dysarthria, agnosia, amnesia, paralysis, loss of coordination, or loss of consciousness  Medication Review  Cinnamon, Coenzyme Q10, Fish Oil, Misc Natural Products, Thera-Gesic, acetaminophen,  allopurinol, carvedilol, cholecalciferol, citalopram, clopidogrel,  cyanocobalamin, diclofenac Sodium, fluticasone, furosemide, gabapentin, glipiZIDE, insulin glargine, levothyroxine, linagliptin, losartan, montelukast, multivitamins ther. w/minerals, pravastatin, and saxagliptin HCl  History Review  Allergy: Tim Miranda is allergic to atorvastatin, isosorbide, lisinopril, and metformin and related. Drug: Tim Miranda  reports no history of drug use. Alcohol:  reports no history of alcohol use. Tobacco:  reports that he has quit smoking. His smoking use included cigarettes. He has a 0.8 pack-year smoking history. He has never used smokeless tobacco. Social: Tim Miranda  reports that he has quit smoking. His smoking use included cigarettes. He has a 0.8 pack-year smoking history. He has never used smokeless tobacco. He reports that he does not drink alcohol and does not use drugs. Medical:  has a past medical history of Actinic keratosis, Arthritis, Coronary artery disease, Degenerative disc disease, lumbar, Depression, Diabetes mellitus without complication (HCC), Dysplastic nevus (10/27/2018), Dysplastic nevus (10/27/2018), GERD (gastroesophageal reflux disease), Gout, CABG, Hypercholesteremia, Hypertension, PLMD (periodic limb movement disorder), Sleep apnea, Stroke (HCC), and Thyroid disease. Surgical: Tim Miranda  has a past surgical history that includes Back surgery; Coronary artery bypass graft; Eye surgery; Shoulder surgery; Carpal tunnel release; Colonoscopy with propofol (N/A, 09/19/2016); and Esophagogastroduodenoscopy (egd) with propofol (N/A, 09/19/2016). Family: family history includes Heart disease in his father and mother.  Laboratory Chemistry Profile   Renal Lab Results  Component Value Date   BUN 19 11/25/2021   CREATININE 1.10 11/25/2021   GFRAA >60 11/22/2018   GFRNONAA >60 11/25/2021    Hepatic Lab Results  Component Value Date   AST 25 11/25/2021   ALT 26 11/25/2021   ALBUMIN 3.8 11/25/2021   ALKPHOS 39 11/25/2021    Electrolytes Lab Results   Component Value Date   NA 140 11/25/2021   K 4.0 11/25/2021   CL 105 11/25/2021   CALCIUM 9.1 11/25/2021   MG 2.1 09/28/2020    Bone No results found for: "VD25OH", "VD125OH2TOT", "GN5621HY8", "MV7846NG2", "25OHVITD1", "25OHVITD2", "25OHVITD3", "TESTOFREE", "TESTOSTERONE"  Inflammation (CRP: Acute Phase) (ESR: Chronic Phase) Lab Results  Component Value Date   CRP 1.0 (H) 01/03/2022   ESRSEDRATE 10 01/03/2022         Note: Above Lab results reviewed.  Recent Imaging Review  DG PAIN CLINIC C-ARM 1-60 MIN NO REPORT Fluoro was used, but no Radiologist interpretation will be provided.  Please refer to "NOTES" tab for provider progress note. Note: Reviewed        Physical Exam  General appearance: Well nourished, well developed, and well hydrated. In no apparent acute distress Mental status: Alert, oriented x 3 (person, place, & time)       Respiratory: No evidence of acute respiratory distress Eyes: PERLA Vitals: BP 130/88 (Cuff Size: Large)   Pulse 97   Temp (!) 97 F (36.1 C) (Temporal)   Resp 18   Ht 5\' 8"  (1.727 m)   Wt 225 lb (102.1 kg)   SpO2 (!) 74%   BMI 34.21 kg/m  BMI: Estimated body mass index is 34.21 kg/m as calculated from the following:   Height as of this encounter: 5\' 8"  (1.727 m).   Weight as of this encounter: 225 lb (102.1 kg). Ideal: Ideal body weight: 68.4 kg (150 lb 12.7 oz) Adjusted ideal body weight: 81.9 kg (180 lb 7.6 oz)  Assessment   Diagnosis Status  1. Lumbar radiculopathy   2. Chronic radicular lumbar pain   3. Failed back surgical syndrome    Controlled Controlled Controlled   Updated Problems:  Problem  Failed Back Surgical Syndrome    Plan of Care  Excellent response after right L5-S1 ESI.  Improvement in functional status as well as analgesic benefit.  Continue to monitor symptoms, repeat lumbar ESI as needed.  At this point, we will delay spinal cord stimulator trial given his positive results from his lumbar epidural  steroid injection.  Patient instructed to call the clinic back for repeating lumbar epidural steroid injection, as needed order placed as below.  Tim Miranda has a current medication list which includes the following Medley-term medication(s): allopurinol, allopurinol, carvedilol, citalopram, gabapentin, gabapentin, insulin glargine, insulin glargine, levothyroxine, losartan, losartan, montelukast, onglyza, pravastatin, and furosemide.  Pharmacotherapy (Medications Ordered): No orders of the defined types were placed in this encounter.  Orders:  Orders Placed This Encounter  Procedures   Lumbar Epidural Injection    Standing Status:   Standing    Number of Occurrences:   2    Next Expected Occurrence:   08/16/2023    Expiration Date:   05/28/2024    Scheduling Instructions:     Procedure: Interlaminar Lumbar Epidural Steroid injection (LESI)            Laterality: RIGHT L5/S1     Sedation: Patient's choice.     Timeframe: PRN    Where will this procedure be performed?:   [facility]   Follow-up plan:   Return for PRN L-ESI.      Recent Visits Date Type Provider Dept  04/29/23 Procedure visit Edward Jolly, MD [facility]  04/11/23 Office Visit Edward Jolly, MD [facility]  Showing recent visits within past 90 days and meeting all other requirements Today's Visits Date Type Provider Dept  05/29/23 Office Visit Edward Jolly, MD [facility]  Showing today's visits and meeting all other requirements Future Appointments No visits were found meeting these conditions. Showing future appointments within next 90 days and meeting all other requirements  I discussed the assessment and treatment plan with the patient. The patient was provided an opportunity to ask questions and all were answered. The patient agreed with the plan and demonstrated an understanding of the instructions.  Patient advised to call back or seek an in-person evaluation  if the symptoms or condition worsens.  Duration of encounter: .  Total time on encounter, as per AMA guidelines included both the face-to-face and non-face-to-face time personally spent by the physician and/or other qualified health care professional(s) on the day of the encounter (includes time in activities that require the physician or other qualified health care professional and does not include time in activities normally performed by clinical staff). Physician's time may include the following activities when performed: Preparing to see the patient (e.g., pre-charting review of records, searching for previously ordered imaging, lab work, and nerve conduction tests) Review of prior analgesic pharmacotherapies. Reviewing PMP Interpreting ordered tests (e.g., lab work, imaging, nerve conduction tests) Performing post-procedure evaluations, including interpretation of diagnostic procedures Obtaining and/or reviewing separately obtained history Performing a medically appropriate examination and/or evaluation Counseling and educating the patient/family/caregiver Ordering medications, tests, or procedures Referring and communicating with other health care professionals (when not separately reported) Documenting clinical information in the electronic or other health record Independently interpreting results (not separately reported) and communicating results to the patient/ family/caregiver Care coordination (not separately reported)  Note by: Edward Jolly, MD Date: 05/29/2023; Time: 3:52 PM

## 2023-09-10 ENCOUNTER — Telehealth (INDEPENDENT_AMBULATORY_CARE_PROVIDER_SITE_OTHER): Payer: Self-pay

## 2023-09-10 NOTE — Telephone Encounter (Signed)
 Patient spouse has been notified to be expecting a call from the front receptionist

## 2023-09-10 NOTE — Telephone Encounter (Signed)
 Patient spouse reach out to the office stating that her husband had two episode of passing out and a lot dizziness. The first episode was about 6 weeks ago and second 10 days later. Patient follow up with PCP and was being  treated for vertigo. Patient has followed up with cardiology and was recommended to follow up with neurology. Patient spouse is concerned that it may be the carotid artery. Patient was last seen 12/2021. Please Advise

## 2023-09-10 NOTE — Telephone Encounter (Signed)
 That's a reasonable concern, we can bring him in with a carotid duplex

## 2023-09-11 ENCOUNTER — Other Ambulatory Visit (INDEPENDENT_AMBULATORY_CARE_PROVIDER_SITE_OTHER): Payer: Self-pay | Admitting: Nurse Practitioner

## 2023-09-11 DIAGNOSIS — R42 Dizziness and giddiness: Secondary | ICD-10-CM

## 2023-09-12 ENCOUNTER — Ambulatory Visit (INDEPENDENT_AMBULATORY_CARE_PROVIDER_SITE_OTHER): Payer: Self-pay

## 2023-09-12 DIAGNOSIS — R42 Dizziness and giddiness: Secondary | ICD-10-CM

## 2023-09-20 ENCOUNTER — Encounter (INDEPENDENT_AMBULATORY_CARE_PROVIDER_SITE_OTHER): Payer: Self-pay | Admitting: Nurse Practitioner

## 2023-09-20 ENCOUNTER — Ambulatory Visit (INDEPENDENT_AMBULATORY_CARE_PROVIDER_SITE_OTHER): Payer: Self-pay | Admitting: Nurse Practitioner

## 2023-09-20 VITALS — BP 127/75 | HR 70 | Resp 18 | Wt 221.0 lb

## 2023-09-20 DIAGNOSIS — I10 Essential (primary) hypertension: Secondary | ICD-10-CM

## 2023-09-20 DIAGNOSIS — R55 Syncope and collapse: Secondary | ICD-10-CM

## 2023-09-20 DIAGNOSIS — E782 Mixed hyperlipidemia: Secondary | ICD-10-CM

## 2023-09-22 NOTE — Progress Notes (Signed)
 Subjective:    Patient ID: Tim Miranda, male    DOB: Nov 06, 1945, 78 y.o.   MRN: [id number] Chief Complaint  Patient presents with   Follow-up    Consult with carotid, dizzness    The patient presents today for follow-up evaluation of his carotid stenosis.  His visit is prompted by recent syncopal episodes.  He notes that these episodes occur randomly when he is sitting or standing.  Unfortunately these episodes are becoming more frequent and nearly daily occurrence.  Patient without warning and in several instances he has had syncopal episodes.  Fortunately he has not had any lingering injuries from this.  He has been treated for vertigo without any improvement.  He notes this has been ongoing and worsening over the last several months.  He recently had a carotid duplex done which shows 1 to 39% stenosis of the bilateral internal carotid arteries with no significant stenosis of the vertebral arteries or evidence of subclavian steal.  However he had a previous CTA of his neck about 2 years ago which showed about a 50% stenosis of the left ICA.    Review of Systems  Neurological:  Positive for dizziness and syncope.  All other systems reviewed and are negative.      Objective:   Physical Exam Vitals reviewed.  HENT:     Head: Normocephalic.  Cardiovascular:     Rate and Rhythm: Normal rate.     Pulses: Normal pulses.  Pulmonary:     Effort: Pulmonary effort is normal.  Skin:    General: Skin is warm and dry.  Neurological:     Mental Status: He is alert and oriented to person, place, and time.  Psychiatric:        Mood and Affect: Mood normal.        Behavior: Behavior normal.        Thought Content: Thought content normal.        Judgment: Judgment normal.     BP 127/75 Comment: left arm  Pulse 70   Resp 18   Wt 221 lb (100.2 kg)   BMI 33.60 kg/m   Past Medical History:  Diagnosis Date   Actinic keratosis    Arthritis    Coronary artery disease    Degenerative  disc disease, lumbar    Depression    Diabetes mellitus without complication (HCC)    Dysplastic nevus 10/27/2018   Right low back 4.0cm lat to spine. Mild atypia, limited margins free.   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 to spine above waistline. Moderate atypia, limited margins free.    GERD (gastroesophageal reflux disease)    Gout    Hx of CABG    Hypercholesteremia    Hypertension    PLMD (periodic limb movement disorder)    Sleep apnea    Stroke [facility])    Thyroid disease     Social History   Socioeconomic History   Marital status: Married    Spouse name: Not on file   Number of children: Not on file   Years of education: Not on file   Highest education level: High school graduate  Occupational History   Occupation: retired  Tobacco Use   Smoking status: Former    Current packs/day: 0.25    Average packs/day: 0.3 packs/day for 3.0 years (0.8 ttl pk-yrs)    Types: Cigarettes   Smokeless tobacco: Never  Vaping Use   Vaping status: Never Used  Substance and  Sexual Activity   Alcohol use: No   Drug use: No   Sexual activity: Not on file  Other Topics Concern   Not on file  Social History Narrative   Right handed    Lives at home with wife    Some caffeine.   Social Drivers of Health   Financial Resource Strain: Patient Declined (01/29/2023)   Received from [facility]   Overall Financial Resource Strain (CARDIA)    Difficulty of Paying Living Expenses: Patient declined  Food Insecurity: Patient Declined (01/29/2023)   Received from [facility]   Hunger Vital Sign    Within the past 12 months, you worried that your food would run out before you got the money to buy more.: Patient declined    Within the past 12 months, the food you bought just didn't last and you didn't have money to get more.: Patient declined  Transportation Needs: Patient Declined (01/29/2023)   Received from [facility] - Transportation    In the past 12 months, has lack of transportation kept you from medical appointments or from getting medications?: Patient declined    Lack of Transportation (Non-Medical): Patient declined  Physical Activity: Not on file  Stress: Not on file  Social Connections: Not on file  Intimate Partner Violence: Not on file    Past Surgical History:  Procedure Laterality Date   BACK SURGERY     CARPAL TUNNEL RELEASE     COLONOSCOPY WITH PROPOFOL  N/A 09/19/2016   Procedure: COLONOSCOPY WITH PROPOFOL ;  Surgeon: Viktoria Lamar DASEN, MD;  Location: [facility] ENDOSCOPY;  Service: Endoscopy;  Laterality: N/A;   CORONARY ARTERY BYPASS GRAFT     ESOPHAGOGASTRODUODENOSCOPY (EGD) WITH PROPOFOL  N/A 09/19/2016   Procedure: ESOPHAGOGASTRODUODENOSCOPY (EGD) WITH PROPOFOL ;  Surgeon: Viktoria Lamar DASEN, MD;  Location: [facility] ENDOSCOPY;  Service: Endoscopy;  Laterality: N/A;   EYE SURGERY     SHOULDER SURGERY      Family History  Problem Relation Age of Onset   Heart disease Mother    Heart disease Father     Allergies  Allergen Reactions   Atorvastatin Other (See Comments)   Isosorbide    Lisinopril    Metformin And Related        Latest Ref Rng & Units 01/03/2022   10:50 AM 11/28/2021   11:54 AM 11/25/2021   11:41 AM  CBC  WBC 4.0 - 10.5 K/uL   8.6   Hemoglobin 13.0 - 17.0 g/dL   86.2   Hematocrit 60.9 - 52.0 %   39.3   Platelets 150 - 400 K/uL 257  288  270       CMP     Component Value Date/Time   NA 140 11/25/2021 1141   K 4.0 11/25/2021 1141   CL 105 11/25/2021 1141   CO2 28 11/25/2021 1141   GLUCOSE 209 (H) 11/25/2021 1141   BUN 19 11/25/2021 1141   CREATININE 1.10 11/25/2021 1141   CALCIUM 9.1 11/25/2021 1141   PROT 6.7 11/25/2021 1141   ALBUMIN 3.8 11/25/2021 1141   AST 25 11/25/2021 1141   ALT 26 11/25/2021 1141   ALKPHOS 39 11/25/2021 1141   BILITOT 0.8 11/25/2021 1141   GFRNONAA >60 11/25/2021 1141     No results found.     Assessment  & Plan:   1. Syncope, unspecified syncope type (Primary) Recommend:  The patient has symptoms that are concerning for possible symptomatic carotid stenosis.  He  had a CTA years ago that showed possible 50% or greater stenosis of the left ICA.  Small  Patient should undergo CT angiography of the carotid arteries to define the degree of stenosis of the internal carotid arteries bilaterally and the anatomic suitability for surgery.  If the patient does indeed need surgery cardiac clearance will be required, once cleared the patient will be scheduled for surgery.  The risks, benefits and alternative therapies were reviewed in detail with the patient.  All questions were answered.  The patient agrees to proceed with imaging.  Continue antiplatelet therapy as prescribed. Continue management of CAD, HTN and Hyperlipidemia. Healthy heart diet, encouraged exercise at least 4 times per week.  - CT ANGIO NECK W OR WO CONTRAST; Future  2. Essential hypertension Continue antihypertensive medications as already ordered, these medications have been reviewed and there are no changes at this time.  3. Mixed hyperlipidemia Continue statin as ordered and reviewed, no changes at this time    Current Outpatient Medications on File Prior to Visit  Medication Sig Dispense Refill   acetaminophen  (TYLENOL ) 500 MG tablet Take 500 mg by mouth in the morning and at bedtime.     allopurinol  (ZYLOPRIM ) 300 MG tablet Take 300 mg by mouth daily.     allopurinol  (ZYLOPRIM ) 300 MG tablet Take 1 tablet by mouth daily.     carvedilol  (COREG ) 12.5 MG tablet Take 12.5 mg by mouth 2 (two) times daily.      cholecalciferol  (VITAMIN D ) 25 MCG (1000 UT) tablet Take 2,000 Units by mouth in the morning and at bedtime.     CINNAMON PO Take 1,000 mg by mouth daily.     citalopram  (CELEXA ) 20 MG tablet TAKE ONE-HALF TABLET BY MOUTH AT BEDTIME FOR ANXIETY     clopidogrel  (PLAVIX ) 75 MG tablet Take 1 tablet (75 mg total) by mouth  daily. 90 tablet 4   Coenzyme Q10 100 MG capsule Take 100 mg by mouth daily.      diclofenac Sodium (VOLTAREN) 1 % GEL Apply 2 g topically 4 (four) times daily.     fluticasone  (FLONASE ) 50 MCG/ACT nasal spray INSTILL 1 SPRAY INTO EACH NOSTRIL TWO TIMES A DAY MAXIMUM 2 SPRAYS IN EACH NOSTRIL DAILY. FOR ALLERGIES     furosemide  (LASIX ) 40 MG tablet Take 0.5 tablets (20 mg total) by mouth daily as needed (leg swelling or weight gain of 3lb or more). 30 tablet 0   gabapentin  (NEURONTIN ) 300 MG capsule 300 mg qAM, 600 mg QHS 90 capsule 2   gabapentin  (NEURONTIN ) 300 MG capsule TAKE 1 CAPSULE BY MOUTH THREE TIMES A DAY AS NEEDED     glipiZIDE  (GLUCOTROL ) 10 MG tablet Take 5 mg by mouth 2 (two) times daily before a meal.     insulin  glargine (LANTUS ) 100 UNIT/ML injection 10 Units.     insulin  glargine (LANTUS ) 100 UNIT/ML injection Inject into the skin.     levothyroxine  (SYNTHROID , LEVOTHROID) 50 MCG tablet Take 50 mcg by mouth daily before breakfast.     linagliptin  (TRADJENTA ) 5 MG TABS tablet Take 5 mg by mouth daily.     losartan  (COZAAR ) 100 MG tablet Take by mouth.     losartan  (COZAAR ) 50 MG tablet Take 100 mg by mouth daily.     Menthol-Methyl Salicylate (THERA-GESIC) 0.5-15 % CREA APPLY SMALL AMOUNT TOPICALLY FOUR TIMES A DAY AS NEEDED FOR PAIN ( AVOID FACE & EYES, WASH HANDS AFTER USING). FOR JOINT PAIN     Misc Natural  Products (MENS PROSTATE HEALTH FORMULA PO) Take 1 tablet by mouth daily.     montelukast  (SINGULAIR ) 10 MG tablet Take 1 tablet by mouth at bedtime.     Multiple Vitamins-Minerals (MULTIVITAMINS THER. W/MINERALS) TABS tablet Take 1 tablet by mouth daily.     Omega-3 Fatty Acids (FISH OIL ) 1000 MG CAPS Take by mouth 2 (two) times daily.     ONGLYZA 5 MG TABS tablet Take 5 mg by mouth daily.     pravastatin  (PRAVACHOL ) 80 MG tablet Take 80 mg by mouth at bedtime.      vitamin B-12 1000 MCG tablet Take 1 tablet (1,000 mcg total) by mouth daily. 30 tablet 0   No current  facility-administered medications on file prior to visit.    There are no Patient Instructions on file for this visit. No follow-ups on file.   Aylan Bayona E Ashland Wiseman, NP

## 2023-09-23 ENCOUNTER — Telehealth (INDEPENDENT_AMBULATORY_CARE_PROVIDER_SITE_OTHER): Payer: Self-pay | Admitting: Nurse Practitioner

## 2023-09-23 ENCOUNTER — Encounter (INDEPENDENT_AMBULATORY_CARE_PROVIDER_SITE_OTHER): Payer: Self-pay | Admitting: Nurse Practitioner

## 2023-09-23 NOTE — Telephone Encounter (Signed)
 Called to give the number to radiology scheduling to the pt to set up the CT appt ordered by Orvin Daring, Wife answered phone and I relayed the information to her, advising to call and set the appt up for the CT and then call the office back to make a CT results appt with Dr. Jama. Wife acknowledged.

## 2023-09-26 ENCOUNTER — Ambulatory Visit
Admission: RE | Admit: 2023-09-26 | Discharge: 2023-09-26 | Disposition: A | Discharge status: Home / Self Care | Source: Ambulatory Visit | Attending: Nurse Practitioner | Admitting: Nurse Practitioner

## 2023-09-26 DIAGNOSIS — R55 Syncope and collapse: Secondary | ICD-10-CM | POA: Diagnosis present

## 2023-09-26 LAB — POCT I-STAT CREATININE: Creatinine, Ser: 1.3 mg/dL — ABNORMAL HIGH (ref 0.61–1.24)

## 2023-09-26 MED ORDER — IOHEXOL 350 MG/ML SOLN
75.0000 mL | Freq: Once | INTRAVENOUS | Status: AC | PRN
  Administered 2023-09-26: 75 mL via INTRAVENOUS

## 2023-09-30 ENCOUNTER — Telehealth (INDEPENDENT_AMBULATORY_CARE_PROVIDER_SITE_OTHER): Payer: Self-pay | Admitting: Nurse Practitioner

## 2023-09-30 ENCOUNTER — Ambulatory Visit (INDEPENDENT_AMBULATORY_CARE_PROVIDER_SITE_OTHER): Payer: Self-pay | Admitting: Nurse Practitioner

## 2023-09-30 NOTE — Progress Notes (Signed)
 Let's bring him in to see GS to review

## 2023-09-30 NOTE — Telephone Encounter (Signed)
 error

## 2023-09-30 NOTE — Telephone Encounter (Signed)
 Pt is scheduled 8.7.25 with Dr. Jama.

## 2023-10-17 ENCOUNTER — Ambulatory Visit (INDEPENDENT_AMBULATORY_CARE_PROVIDER_SITE_OTHER): Admitting: Vascular Surgery

## 2023-10-20 NOTE — Progress Notes (Unsigned)
 MRN : [id number]  Tim Miranda is a 78 y.o. (Jul 09, 1945) male who presents with chief complaint of check carotid arteries.  History of Present Illness:   The patient is seen for follow up evaluation of carotid stenosis status post CT angiogram. CT scan was done 09/28/2023.  Patient reports that the test went well with no problems or complications.  Study is reviewed by me personally with the patient and shows 30 to 49% stenosis internal carotid arteries.  Vertebral arteries are widely patent.  He continues to have episodes of syncope and has fallen a couple times.  The patient denies interval amaurosis fugax. There is no recent or interval TIA symptoms or focal motor deficits. There is no prior documented CVA.  The patient is taking enteric-coated aspirin  81 mg daily.  There is no history of migraine headaches. There is no history of seizures.  No recent shortening of the patient's walking distance or new symptoms consistent with claudication.  No history of rest pain symptoms. No new ulcers or wounds of the lower extremities have occurred.  There is no history of DVT, PE or superficial thrombophlebitis. No recent episodes of angina or shortness of breath documented.   CT angiogram dated 09/26/2023 is reviewed by me personally and shows 30-49% stenosis consistent with calcified plaque at the origin of the bilateral internal carotid artery.   No outpatient medications have been marked as taking for the 10/21/23 encounter (Appointment) with Jama, Cordella MATSU, MD.    Past Medical History:  Diagnosis Date   Actinic keratosis    Arthritis    Coronary artery disease    Degenerative disc disease, lumbar    Depression    Diabetes mellitus without complication (HCC)    Dysplastic nevus 10/27/2018   Right low back 4.0cm lat to spine. Mild atypia, limited margins free.   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 to spine above waistline. Moderate  atypia, limited margins free.    GERD (gastroesophageal reflux disease)    Gout    Hx of CABG    Hypercholesteremia    Hypertension    PLMD (periodic limb movement disorder)    Sleep apnea    Stroke [facility])    Thyroid disease     Past Surgical History:  Procedure Laterality Date   BACK SURGERY     CARPAL TUNNEL RELEASE     COLONOSCOPY WITH PROPOFOL  N/A 09/19/2016   Procedure: COLONOSCOPY WITH PROPOFOL ;  Surgeon: Viktoria Lamar DASEN, MD;  Location: [facility] ENDOSCOPY;  Service: Endoscopy;  Laterality: N/A;   CORONARY ARTERY BYPASS GRAFT     ESOPHAGOGASTRODUODENOSCOPY (EGD) WITH PROPOFOL  N/A 09/19/2016   Procedure: ESOPHAGOGASTRODUODENOSCOPY (EGD) WITH PROPOFOL ;  Surgeon: Viktoria Lamar DASEN, MD;  Location: [facility] ENDOSCOPY;  Service: Endoscopy;  Laterality: N/A;   EYE SURGERY     SHOULDER SURGERY      Social History Social History   Tobacco Use   Smoking status: Former    Current packs/day: 0.25    Average packs/day: 0.3 packs/day for 3.0 years (0.8 ttl pk-yrs)    Types: Cigarettes   Smokeless tobacco: Never  Vaping Use   Vaping status: Never Used  Substance Use Topics   Alcohol use: No   Drug use: No    Family History Family History  Problem Relation Age of Onset   Heart disease Mother  Heart disease Father     Allergies  Allergen Reactions   Atorvastatin Other (See Comments)   Isosorbide    Lisinopril    Metformin And Related      REVIEW OF SYSTEMS (Negative unless checked)  Constitutional: [] Weight loss  [] Fever  [] Chills Cardiac: [] Chest pain   [] Chest pressure   [] Palpitations   [] Shortness of breath when laying flat   [] Shortness of breath with exertion. Vascular:  [x] Pain in legs with walking   [] Pain in legs at rest  [] History of DVT   [] Phlebitis   [] Swelling in legs   [] Varicose veins   [] Non-healing ulcers Pulmonary:   [] Uses home oxygen   [] Productive cough   [] Hemoptysis   [] Wheeze  [] COPD   [] Asthma Neurologic:  [] Dizziness   [] Seizures   [] History of  stroke   [] History of TIA  [] Aphasia   [] Vissual changes   [] Weakness or numbness in arm   [] Weakness or numbness in leg Musculoskeletal:   [] Joint swelling   [] Joint pain   [] Low back pain Hematologic:  [] Easy bruising  [] Easy bleeding   [] Hypercoagulable state   [] Anemic Gastrointestinal:  [] Diarrhea   [] Vomiting  [] Gastroesophageal reflux/heartburn   [] Difficulty swallowing. Genitourinary:  [] Chronic kidney disease   [] Difficult urination  [] Frequent urination   [] Blood in urine Skin:  [] Rashes   [] Ulcers  Psychological:  [] History of anxiety   []  History of major depression.  Physical Examination  There were no vitals filed for this visit. There is no height or weight on file to calculate BMI. Gen: WD/WN, NAD Head: [place]/AT, No temporalis wasting.  Ear/Nose/Throat: Hearing grossly intact, nares w/o erythema or drainage Eyes: PER, EOMI, sclera nonicteric.  Neck: Supple, no masses.  No bruit or JVD.  Pulmonary:  Good air movement, no audible wheezing, no use of accessory muscles.  Cardiac: RRR, normal S1, S2, no Murmurs. Vascular:  carotid bruit noted Vessel Right Left  Radial Palpable Palpable  Carotid  Palpable  Palpable  Gastrointestinal: soft, non-distended. No guarding/no peritoneal signs.  Musculoskeletal: M/S 5/5 throughout.  No visible deformity.  Neurologic: CN 2-12 intact. Pain and light touch intact in extremities.  Symmetrical.  Speech is fluent. Motor exam as listed above. Psychiatric: Judgment intact, Mood & affect appropriate for pt's clinical situation. Dermatologic: No rashes or ulcers noted.  No changes consistent with cellulitis.   CBC Lab Results  Component Value Date   WBC 8.6 11/25/2021   HGB 13.7 11/25/2021   HCT 39.3 11/25/2021   MCV 91.6 11/25/2021   PLT 257 01/03/2022    BMET    Component Value Date/Time   NA 140 11/25/2021 1141   K 4.0 11/25/2021 1141   CL 105 11/25/2021 1141   CO2 28 11/25/2021 1141   GLUCOSE 209 (H) 11/25/2021 1141   BUN 19  11/25/2021 1141   CREATININE 1.30 (H) 09/26/2023 0754   CALCIUM 9.1 11/25/2021 1141   GFRNONAA >60 11/25/2021 1141   GFRAA >60 11/22/2018 0645   CrCl cannot be calculated (Patient's most recent lab result is older than the maximum 21 days allowed.).  COAG Lab Results  Component Value Date   INR 1.1 11/25/2021   INR 1.1 09/26/2020    Radiology CT ANGIO NECK W OR WO CONTRAST Result Date: 09/28/2023 EXAM: CTA Neck 09/26/2023 08:02:57 AM TECHNIQUE: CT of the neck was performed with the administration of intravenous contrast. Multiplanar 2D and/or 3D reformatted images are provided for review. Automated exposure control, iterative reconstruction, and/or weight based adjustment of the  mA/kV was utilized to reduce the radiation dose to as low as reasonably achievable. Stenosis of the internal carotid arteries measured using NASCET criteria. COMPARISON: 09/27/2020 CLINICAL HISTORY: Carotid artery stenosis. The patient has symptoms that are concerning for possible symptomatic carotid stenosis. He had a CTA years ago that showed possible 50% or greater stenosis of the left ICA. FINDINGS: AORTIC ARCH AND ARCH VESSELS: No dissection or arterial injury. No significant stenosis of the brachiocephalic or subclavian arteries. Mild calcific aortic atherosclerosis. CERVICAL CAROTID ARTERIES: No dissection, arterial injury, or hemodynamically significant stenosis by NASCET criteria. Bulky atherosclerotic calcification at the right carotid bifurcation causes less than 50% stenosis. Mixed density atherosclerotic disease at the left carotid bifurcation extends into the left ICA, the cause is less than 50% stenosis. CERVICAL VERTEBRAL ARTERIES: No dissection, arterial injury, or significant stenosis. Mild atherosclerotic calcification of the right vertebral artery V4 segment without stenosis. LUNGS AND MEDIASTINUM: Unremarkable. SOFT TISSUES: No acute abnormality. BONES: No acute abnormality. Mild atherosclerotic  calcification of both internal carotid arteries of the skull base. IMPRESSION: 1. Unchanged bulky atherosclerotic calcification at the right carotid bifurcation causing less than 50% stenosis. 2. Unchanged mixed density atherosclerotic disease at the left carotid bifurcation extending into the left ICA, causing less than 50% stenosis. Electronically signed by: Franky Stanford MD 09/28/2023 03:27 AM EDT RP Workstation: [id number]     Assessment/Plan 1. Stenosis of carotid artery, unspecified laterality (Primary) Recommend:  Given the patient's asymptomatic subcritical stenosis no further invasive testing or surgery at this time.  Given the findings on CT angio it is highly unlikely that his syncope is related to vascular insufficiency.  I will place an ambulatory referral to ENT so that his vestibular system can be evaluated.  Duplex ultrasound shows 30 to 49% stenosis bilaterally.  Continue antiplatelet therapy as prescribed Continue management of CAD, HTN and Hyperlipidemia Healthy heart diet,  encouraged exercise at least 4 times per week  Follow up in 6 months with duplex ultrasound and physical exam.  If this study remains unchanged then he will be followed at 71-month intervals.  - VAS US  CAROTID; Future - Ambulatory referral to ENT  2. Syncope and collapse Recommend:  Given the patient's asymptomatic subcritical stenosis no further invasive testing or surgery at this time.  Given the findings on CT angio it is highly unlikely that his syncope is related to vascular insufficiency.  I will place an ambulatory referral to ENT so that his vestibular system can be evaluated.  Duplex ultrasound shows 30 to 49% stenosis bilaterally.  Continue antiplatelet therapy as prescribed Continue management of CAD, HTN and Hyperlipidemia Healthy heart diet,  encouraged exercise at least 4 times per week  Follow up in 6 months with duplex ultrasound and physical exam.  If this study remains unchanged  then he will be followed at 6-month intervals. - VAS US  CAROTID; Future - Ambulatory referral to ENT  3. Atherosclerosis of native coronary artery of native heart without angina pectoris Continue cardiac and antihypertensive medications as already ordered and reviewed, no changes at this time.  Continue statin as ordered and reviewed, no changes at this time  Nitrates PRN for chest pain  4. Essential hypertension Continue antihypertensive medications as already ordered, these medications have been reviewed and there are no changes at this time.  5. Insulin  dependent type 2 diabetes mellitus (HCC) Continue hypoglycemic medications as already ordered, these medications have been reviewed and there are no changes at this time.  Hgb A1C to be monitored as  already arranged by primary service  6. Mixed hyperlipidemia Continue statin as ordered and reviewed, no changes at this time    Cordella Shawl, MD  10/20/2023 4:21 PM

## 2023-10-21 ENCOUNTER — Ambulatory Visit (INDEPENDENT_AMBULATORY_CARE_PROVIDER_SITE_OTHER): Admitting: Vascular Surgery

## 2023-10-21 ENCOUNTER — Encounter (INDEPENDENT_AMBULATORY_CARE_PROVIDER_SITE_OTHER): Payer: Self-pay | Admitting: Vascular Surgery

## 2023-10-21 VITALS — BP 130/79 | HR 66 | Ht 68.0 in | Wt 221.4 lb

## 2023-10-21 DIAGNOSIS — E119 Type 2 diabetes mellitus without complications: Secondary | ICD-10-CM

## 2023-10-21 DIAGNOSIS — I10 Essential (primary) hypertension: Secondary | ICD-10-CM | POA: Diagnosis not present

## 2023-10-21 DIAGNOSIS — I6529 Occlusion and stenosis of unspecified carotid artery: Secondary | ICD-10-CM

## 2023-10-21 DIAGNOSIS — I2510 Atherosclerotic heart disease of native coronary artery without angina pectoris: Secondary | ICD-10-CM

## 2023-10-21 DIAGNOSIS — R55 Syncope and collapse: Secondary | ICD-10-CM

## 2023-10-21 DIAGNOSIS — E782 Mixed hyperlipidemia: Secondary | ICD-10-CM

## 2023-10-21 DIAGNOSIS — Z794 Long term (current) use of insulin: Secondary | ICD-10-CM

## 2023-10-22 ENCOUNTER — Encounter (INDEPENDENT_AMBULATORY_CARE_PROVIDER_SITE_OTHER): Payer: Self-pay | Admitting: Vascular Surgery

## 2023-10-22 DIAGNOSIS — R55 Syncope and collapse: Secondary | ICD-10-CM | POA: Insufficient documentation

## 2023-12-25 ENCOUNTER — Ambulatory Visit: Payer: Medicare Other | Admitting: Dermatology

## 2024-02-12 LAB — COLOGUARD

## 2024-02-21 LAB — COLOGUARD

## 2024-02-29 LAB — COLOGUARD

## 2024-03-03 ENCOUNTER — Encounter: Payer: Self-pay | Admitting: Dermatology

## 2024-03-03 ENCOUNTER — Ambulatory Visit (INDEPENDENT_AMBULATORY_CARE_PROVIDER_SITE_OTHER): Admitting: Dermatology

## 2024-03-03 DIAGNOSIS — D1801 Hemangioma of skin and subcutaneous tissue: Secondary | ICD-10-CM

## 2024-03-03 DIAGNOSIS — L578 Other skin changes due to chronic exposure to nonionizing radiation: Secondary | ICD-10-CM

## 2024-03-03 DIAGNOSIS — D239 Other benign neoplasm of skin, unspecified: Secondary | ICD-10-CM

## 2024-03-03 DIAGNOSIS — W908XXA Exposure to other nonionizing radiation, initial encounter: Secondary | ICD-10-CM

## 2024-03-03 DIAGNOSIS — L814 Other melanin hyperpigmentation: Secondary | ICD-10-CM

## 2024-03-03 DIAGNOSIS — L820 Inflamed seborrheic keratosis: Secondary | ICD-10-CM

## 2024-03-03 DIAGNOSIS — L821 Other seborrheic keratosis: Secondary | ICD-10-CM

## 2024-03-03 DIAGNOSIS — L989 Disorder of the skin and subcutaneous tissue, unspecified: Secondary | ICD-10-CM

## 2024-03-03 DIAGNOSIS — D229 Melanocytic nevi, unspecified: Secondary | ICD-10-CM

## 2024-03-03 DIAGNOSIS — Z86018 Personal history of other benign neoplasm: Secondary | ICD-10-CM

## 2024-03-03 DIAGNOSIS — Z1283 Encounter for screening for malignant neoplasm of skin: Secondary | ICD-10-CM

## 2024-03-03 DIAGNOSIS — Z7189 Other specified counseling: Secondary | ICD-10-CM

## 2024-03-03 NOTE — Progress Notes (Signed)
 "  Follow-Up Visit   Subjective  Tim Miranda is a 78 y.o. male who presents for the following: Skin Cancer Screening and Full Body Skin Exam  The patient presents for Total-Body Skin Exam (TBSE) for skin cancer screening and mole check. The patient has spots, moles and lesions to be evaluated, some may be new or changing and the patient may have concern these could be cancer.  The following portions of the chart were reviewed this encounter and updated as appropriate: medications, allergies, medical history  Review of Systems:  No other skin or systemic complaints except as noted in HPI or Assessment and Plan.  Objective  Well appearing patient in no apparent distress; mood and affect are within normal limits.  A full examination was performed including scalp, head, eyes, ears, nose, lips, neck, chest, axillae, abdomen, back, buttocks, bilateral upper extremities, bilateral lower extremities, hands, feet, fingers, toes, fingernails, and toenails. All findings within normal limits unless otherwise noted below.   Relevant physical exam findings are noted in the Assessment and Plan. R mid back x 1 Erythematous stuck-on, waxy papule or plaque  Assessment & Plan   SKIN CANCER SCREENING PERFORMED TODAY.  ACTINIC DAMAGE - Chronic condition, secondary to cumulative UV/sun exposure - diffuse scaly erythematous macules with underlying dyspigmentation - Recommend daily broad spectrum sunscreen SPF 30+ to sun-exposed areas, reapply every 2 hours as needed.  - Staying in the shade or wearing [place] sleeves, sun glasses (UVA+UVB protection) and wide brim hats (4-inch brim around the entire circumference of the hat) are also recommended for sun protection.  - Call for new or changing lesions.  LENTIGINES, SEBORRHEIC KERATOSES, HEMANGIOMAS - Benign normal skin lesions - Benign-appearing - Call for any changes  MELANOCYTIC NEVI - Tan-brown and/or pink-flesh-colored symmetric macules and  papules - Benign appearing on exam today - Observation - Call clinic for new or changing moles - Recommend daily use of broad spectrum spf 30+ sunscreen to sun-exposed areas.   HISTORY OF DYSPLASTIC NEVI No evidence of recurrence today Recommend regular full body skin exams Recommend daily broad spectrum sunscreen SPF 30+ to sun-exposed areas, reapply every 2 hours as needed.  Call if any new or changing lesions are noted between office visits INFLAMED SEBORRHEIC KERATOSIS R mid back x 1 Symptomatic, irritating, patient would like treated. - Destruction of lesion - R mid back x 1 Complexity: simple   Destruction method: cryotherapy   Informed consent: discussed and consent obtained   Timeout:  patient name, date of birth, surgical site, and procedure verified Lesion destroyed using liquid nitrogen: Yes   Region frozen until ice ball extended beyond lesion: Yes   Outcome: patient tolerated procedure well with no complications   Post-procedure details: wound care instructions given     DERMATOFIBROMA vs nevus vs neurofibroma - L med calf Exam: Firm pink/brown papulenodule with dimple sign. Treatment Plan: A dermatofibroma is a benign growth possibly related to trauma, such as an insect bite, cut from shaving, or inflamed acne-type bump.  Treatment options to remove include shave or excision with resulting scar and risk of recurrence.  Since benign-appearing and not bothersome, will observe for now.  Benign-appearing.  Observation.  Call clinic for new or changing lesions.  Recommend daily use of broad spectrum spf 30+ sunscreen to sun-exposed areas.   Return in about 1 year (around 03/03/2025) for hx of dysplastic nevus.  LILLETTE Rosina Mayans, CMA, am acting as scribe for Alm Rhyme, MD .   Documentation: I have  reviewed the above documentation for accuracy and completeness, and I agree with the above.  Alm Rhyme, MD    "

## 2024-03-03 NOTE — Patient Instructions (Signed)

## 2024-04-23 ENCOUNTER — Encounter (INDEPENDENT_AMBULATORY_CARE_PROVIDER_SITE_OTHER)

## 2024-04-23 ENCOUNTER — Ambulatory Visit (INDEPENDENT_AMBULATORY_CARE_PROVIDER_SITE_OTHER): Admitting: Vascular Surgery

## 2025-03-03 ENCOUNTER — Ambulatory Visit: Admitting: Dermatology
